# Patient Record
Sex: Male | Born: 1960
Health system: Southern US, Community
[De-identification: ages and names within clinical notes are randomized; demographics above are authoritative.]

## PROBLEM LIST (undated history)

## (undated) DIAGNOSIS — E7800 Pure hypercholesterolemia, unspecified: Secondary | ICD-10-CM

## (undated) DIAGNOSIS — E119 Type 2 diabetes mellitus without complications: Secondary | ICD-10-CM

## (undated) DIAGNOSIS — I252 Old myocardial infarction: Secondary | ICD-10-CM

## (undated) DIAGNOSIS — I2510 Atherosclerotic heart disease of native coronary artery without angina pectoris: Secondary | ICD-10-CM

## (undated) DIAGNOSIS — M722 Plantar fascial fibromatosis: Secondary | ICD-10-CM

## (undated) DIAGNOSIS — I10 Essential (primary) hypertension: Secondary | ICD-10-CM

## (undated) HISTORY — PX: CORONARY ANGIOPLASTY WITH STENT PLACEMENT: SHX49

## (undated) HISTORY — PX: CARDIAC SURGERY: SHX584

---

## 2006-08-26 ENCOUNTER — Emergency Department: Payer: Self-pay | Admitting: Emergency Medicine

## 2007-01-29 ENCOUNTER — Ambulatory Visit: Payer: Self-pay | Admitting: Gastroenterology

## 2008-12-19 ENCOUNTER — Ambulatory Visit: Payer: Self-pay | Admitting: Internal Medicine

## 2009-06-15 ENCOUNTER — Ambulatory Visit: Payer: Self-pay | Admitting: Cardiology

## 2010-03-12 ENCOUNTER — Ambulatory Visit: Payer: Self-pay | Admitting: Internal Medicine

## 2010-03-12 ENCOUNTER — Inpatient Hospital Stay: Payer: Self-pay | Admitting: Specialist

## 2010-06-27 ENCOUNTER — Ambulatory Visit: Payer: Self-pay | Admitting: Cardiology

## 2010-07-14 ENCOUNTER — Emergency Department: Payer: Self-pay | Admitting: Emergency Medicine

## 2013-05-07 ENCOUNTER — Ambulatory Visit: Payer: Self-pay | Admitting: Gastroenterology

## 2013-05-10 LAB — PATHOLOGY REPORT

## 2013-12-11 DIAGNOSIS — I10 Essential (primary) hypertension: Secondary | ICD-10-CM | POA: Insufficient documentation

## 2013-12-11 DIAGNOSIS — E785 Hyperlipidemia, unspecified: Secondary | ICD-10-CM | POA: Insufficient documentation

## 2013-12-11 DIAGNOSIS — Z720 Tobacco use: Secondary | ICD-10-CM | POA: Insufficient documentation

## 2013-12-11 DIAGNOSIS — K219 Gastro-esophageal reflux disease without esophagitis: Secondary | ICD-10-CM | POA: Insufficient documentation

## 2014-02-17 DIAGNOSIS — Z9889 Other specified postprocedural states: Secondary | ICD-10-CM | POA: Insufficient documentation

## 2015-02-20 DIAGNOSIS — I25119 Atherosclerotic heart disease of native coronary artery with unspecified angina pectoris: Secondary | ICD-10-CM | POA: Insufficient documentation

## 2015-08-19 ENCOUNTER — Ambulatory Visit (INDEPENDENT_AMBULATORY_CARE_PROVIDER_SITE_OTHER): Payer: Commercial Managed Care - PPO

## 2015-08-19 ENCOUNTER — Ambulatory Visit
Admission: EM | Admit: 2015-08-19 | Discharge: 2015-08-19 | Disposition: A | Discharge status: Home / Self Care | Payer: Commercial Managed Care - PPO | Attending: Internal Medicine | Admitting: Internal Medicine

## 2015-08-19 DIAGNOSIS — M10071 Idiopathic gout, right ankle and foot: Secondary | ICD-10-CM | POA: Diagnosis not present

## 2015-08-19 DIAGNOSIS — M79671 Pain in right foot: Secondary | ICD-10-CM | POA: Diagnosis not present

## 2015-08-19 HISTORY — DX: Pure hypercholesterolemia, unspecified: E78.00

## 2015-08-19 HISTORY — DX: Old myocardial infarction: I25.2

## 2015-08-19 HISTORY — DX: Atherosclerotic heart disease of native coronary artery without angina pectoris: I25.10

## 2015-08-19 HISTORY — DX: Plantar fascial fibromatosis: M72.2

## 2015-08-19 HISTORY — DX: Essential (primary) hypertension: I10

## 2015-08-19 MED ORDER — KETOROLAC TROMETHAMINE 60 MG/2ML IM SOLN
60.0000 mg | Freq: Once | INTRAMUSCULAR | Status: AC
  Administered 2015-08-19: 60 mg via INTRAMUSCULAR

## 2015-08-19 MED ORDER — COLCHICINE 0.6 MG PO TABS: 0.6000 mg | ORAL_TABLET | Freq: Every day | ORAL | Status: DC

## 2015-08-19 NOTE — Discharge Instructions (Signed)
Please increase water-- stop alcohol--  Decrease red meats/seafood Contact PCP for re-evaluation next week  Gout Gout is an inflammatory arthritis caused by a buildup of uric acid crystals in the joints. Uric acid is a chemical that is normally present in the blood. When the level of uric acid in the blood is too high it can form crystals that deposit in your joints and tissues. This causes joint redness, soreness, and swelling (inflammation). Repeat attacks are common. Over time, uric acid crystals can form into masses (tophi) near a joint, destroying bone and causing disfigurement. Gout is treatable and often preventable. CAUSES  The disease begins with elevated levels of uric acid in the blood. Uric acid is produced by your body when it breaks down a naturally found substance called purines. Certain foods you eat, such as meats and fish, contain high amounts of purines. Causes of an elevated uric acid level include:  Being passed down from parent to child (heredity).  Diseases that cause increased uric acid production (such as obesity, psoriasis, and certain cancers).  Excessive alcohol use.  Diet, especially diets rich in meat and seafood.  Medicines, including certain cancer-fighting medicines (chemotherapy), water pills (diuretics), and aspirin.  Chronic kidney disease. The kidneys are no longer able to remove uric acid well.  Problems with metabolism. Conditions strongly associated with gout include:  Obesity.  High blood pressure.  High cholesterol.  Diabetes. Not everyone with elevated uric acid levels gets gout. It is not understood why some people get gout and others do not. Surgery, joint injury, and eating too much of certain foods are some of the factors that can lead to gout attacks. SYMPTOMS   An attack of gout comes on quickly. It causes intense pain with redness, swelling, and warmth in a joint.  Fever can occur.  Often, only one joint is involved. Certain  joints are more commonly involved:  Base of the big toe.  Knee.  Ankle.  Wrist.  Finger. Without treatment, an attack usually goes away in a few days to weeks. Between attacks, you usually will not have symptoms, which is different from many other forms of arthritis. DIAGNOSIS  Your caregiver will suspect gout based on your symptoms and exam. In some cases, tests may be recommended. The tests may include:  Blood tests.  Urine tests.  X-rays.  Joint fluid exam. This exam requires a needle to remove fluid from the joint (arthrocentesis). Using a microscope, gout is confirmed when uric acid crystals are seen in the joint fluid. TREATMENT  There are two phases to gout treatment: treating the sudden onset (acute) attack and preventing attacks (prophylaxis).  Treatment of an Acute Attack.  Medicines are used. These include anti-inflammatory medicines or steroid medicines.  An injection of steroid medicine into the affected joint is sometimes necessary.  The painful joint is rested. Movement can worsen the arthritis.  You may use warm or cold treatments on painful joints, depending which works best for you.  Treatment to Prevent Attacks.  If you suffer from frequent gout attacks, your caregiver may advise preventive medicine. These medicines are started after the acute attack subsides. These medicines either help your kidneys eliminate uric acid from your body or decrease your uric acid production. You may need to stay on these medicines for a very long time.  The early phase of treatment with preventive medicine can be associated with an increase in acute gout attacks. For this reason, during the first few months of treatment, your caregiver  may also advise you to take medicines usually used for acute gout treatment. Be sure you understand your caregiver's directions. Your caregiver may make several adjustments to your medicine dose before these medicines are effective.  Discuss  dietary treatment with your caregiver or dietitian. Alcohol and drinks high in sugar and fructose and foods such as meat, poultry, and seafood can increase uric acid levels. Your caregiver or dietitian can advise you on drinks and foods that should be limited. HOME CARE INSTRUCTIONS   Do not take aspirin to relieve pain. This raises uric acid levels.  Only take over-the-counter or prescription medicines for pain, discomfort, or fever as directed by your caregiver.  Rest the joint as much as possible. When in bed, keep sheets and blankets off painful areas.  Keep the affected joint raised (elevated).  Apply warm or cold treatments to painful joints. Use of warm or cold treatments depends on which works best for you.  Use crutches if the painful joint is in your leg.  Drink enough fluids to keep your urine clear or pale yellow. This helps your body get rid of uric acid. Limit alcohol, sugary drinks, and fructose drinks.  Follow your dietary instructions. Pay careful attention to the amount of protein you eat. Your daily diet should emphasize fruits, vegetables, whole grains, and fat-free or low-fat milk products. Discuss the use of coffee, vitamin C, and cherries with your caregiver or dietitian. These may be helpful in lowering uric acid levels.  Maintain a healthy body weight. SEEK MEDICAL CARE IF:   You develop diarrhea, vomiting, or any side effects from medicines.  You do not feel better in 24 hours, or you are getting worse. SEEK IMMEDIATE MEDICAL CARE IF:   Your joint becomes suddenly more tender, and you have chills or a fever. MAKE SURE YOU:   Understand these instructions.  Will watch your condition.  Will get help right away if you are not doing well or get worse.   This information is not intended to replace advice given to you by your health care provider. Make sure you discuss any questions you have with your health care provider.   Document Released: 08/16/2000  Document Revised: 09/09/2014 Document Reviewed: 04/01/2012 Elsevier Interactive Patient Education 2016 [place].  Gout Gout is when your joints become red, sore, and swell (inflamed). This is caused by the buildup of uric acid crystals in the joints. Uric acid is a chemical that is normally in the blood. If the level of uric acid gets too high in the blood, these crystals form in your joints and tissues. Over time, these crystals can form into masses near the joints and tissues. These masses can destroy bone and cause the bone to look misshapen (deformed). HOME CARE   Do not take aspirin for pain.  Only take medicine as told by your doctor.  Rest the joint as much as you can. When in bed, keep sheets and blankets off painful areas.  Keep the sore joints raised (elevated).  Put warm or cold packs on painful joints. Use of warm or cold packs depends on which works best for you.  Use crutches if the painful joint is in your leg.  Drink enough fluids to keep your pee (urine) clear or pale yellow. Limit alcohol, sugary drinks, and drinks with fructose in them.  Follow your diet instructions. Pay careful attention to how much protein you eat. Include fruits, vegetables, whole grains, and fat-free or low-fat milk products in your daily  diet. Talk to your doctor or dietitian about the use of coffee, vitamin C, and cherries. These may help lower uric acid levels.  Keep a healthy body weight. GET HELP RIGHT AWAY IF:   You have watery poop (diarrhea), throw up (vomit), or have any side effects from medicines.  You do not feel better in 24 hours, or you are getting worse.  Your joint becomes suddenly more tender, and you have chills or a fever. MAKE SURE YOU:   Understand these instructions.  Will watch your condition.  Will get help right away if you are not doing well or get worse.   This information is not intended to replace advice given to you by your health care provider. Make  sure you discuss any questions you have with your health care provider.   Document Released: 05/28/2008 Document Revised: 09/09/2014 Document Reviewed: 04/01/2012 Elsevier Interactive Patient Education [employer].

## 2015-08-19 NOTE — ED Provider Notes (Signed)
CSN: [id number]     Arrival date & time 08/19/15  [id number] History   First MD Initiated Contact with Patient 08/19/15 [phone]     Chief Complaint  Patient presents with  . Foot Pain    Right foot pain x months off and one. Worse with walkking. Pain 7/10    (Consider location/radiation/quality/duration/timing/severity/associated sxs/prior Treatment) HPI  54 yo M  c/o right foot pain x about a week; hx of previous similar episodes Denies trauma but stands on cement floor without rubber mat for work all day Pain increased over last week. Some mid-foot warmth  Previously followed by Jefm Bryant Has had similar episode Has had cortisone injection in past  Drinks "probably one or two beers daily", more weekends Red meats, seafood diet  S/P  3 stents - former smoker- now E cigs Brother has had gout- similar diet  Past Medical History  Diagnosis Date  . Coronary artery disease   . Hypertension   . High cholesterol   . Myocardial infarct, old   . Plantar fasciitis    Past Surgical History  Procedure Laterality Date  . Coronary angioplasty with stent placement     Family History  Problem Relation Age of Onset  . Gout Brother    Social History  Substance Use Topics  . Smoking status: Former Research scientist (life sciences)  . Smokeless tobacco: None  . Alcohol Use: Yes     Comment: last use last night    Review of Systems Review of 10 systems negative for acute change except as referenced in HPI  Allergies  Review of patient's allergies indicates no known allergies.  Home Medications   Prior to Admission medications   Medication Sig Start Date End Date Taking? Authorizing Provider  amLODipine (NORVASC) 10 MG tablet Take 10 mg by mouth daily.   Yes Historical Provider, MD  aspirin 81 MG tablet Take 81 mg by mouth daily.   Yes Historical Provider, MD  hydrochlorothiazide (HYDRODIURIL) 25 MG tablet Take 25 mg by mouth daily.   Yes Historical Provider, MD  lisinopril (PRINIVIL,ZESTRIL) 20 MG tablet Take 20  mg by mouth daily.   Yes Historical Provider, MD  metoprolol succinate (TOPROL-XL) 25 MG 24 hr tablet Take 25 mg by mouth daily.   Yes Historical Provider, MD  omeprazole (PRILOSEC) 40 MG capsule Take 40 mg by mouth daily.   Yes Historical Provider, MD  prasugrel (EFFIENT) 10 MG TABS tablet Take 10 mg by mouth daily.   Yes Historical Provider, MD  simvastatin (ZOCOR) 40 MG tablet Take 40 mg by mouth daily.   Yes Historical Provider, MD  colchicine 0.6 MG tablet Take 1 tablet (0.6 mg total) by mouth daily. 2 pills immediately then one in 12 hours, repeat one daily for for 10 days 08/19/15   Jan Fireman, PA-C   Meds Ordered and Administered this Visit   Medications  ketorolac (TORADOL) injection 60 mg (60 mg Intramuscular Given 08/19/15 1029)  significant improvement in foot pain  BP 145/81 mmHg  Pulse 66  Temp(Src) 98.2 F (36.8 C) (Oral)  Resp 20  Ht 5' 6.5" (1.689 m)  Wt 198 lb (89.812 kg)  BMI 31.48 kg/m2  SpO2 97% No data found.   Physical Exam   General: NAD, does not appear toxic HEENT:normocephalic,atraumatic, mucous membranes moist,grossly normal hearing Eyes: EOMI, conjunctiva clear, conjugate gaze Neck: supple,no lymphadenopathy Resp : CT A, bilat; normal respiratory effort Card : RRR Abd:  Not distended Skin: no rash, skin intact, mild erythema dorsum right  foot MSK: no deformities, ambulatory without assistance- complains of mid right foot pain with palpation; mid foot mildly warm, toes FROM,  good cap fill. Mildly swollen dorsum mid-foot. Moderate pes planus bilaterally Neuro : good attention,recall-good memory, no focal neuro deficits Psych: speech and behavior appropriate   ED Course  Procedures (including critical care time)  Labs Review Labs Reviewed - No data to display  Imaging Review No results found. Midfoot degenerative changes noted  Pertinent  imaging results that were available during my care of the patient were reviewed by me and considered  in medical decision making.   MDM   1. Right foot pain   2. Acute idiopathic gout of right foot    Plan: Test/x-ray results and diagnosis reviewed with patient Rx as per orders;  benefits, risks, potential side effects reviewed   Recommend supportive treatment with dietary changes, limited alcohol, increased water hydration Informational handouts given Diagnosis and treatment discussed.  Questions fielded, expectations and recommendations reviewed. Discussed follow up and return parameters including no resolution or any worsening condition..  Patient expresses understanding  and agrees to plan. [facility] return to [facility] with questions, concerns or exacerbation.  Needs routine management with PCP- Podiatry consult possibly helpful-unclear whether he has shoe inserts that are actually worn Encouraged him to return to PCP care for decision on anti-inflammatory managment  Discharge Medication List as of 08/19/2015 11:23 AM    START taking these medications   Details  colchicine 0.6 MG tablet Take 1 tablet (0.6 mg total) by mouth daily. 2 pills immediately then one in 12 hours, repeat one daily for for 10 days, Starting 08/19/2015, Until Discontinued, Print        Jan Fireman, PA-C 08/23/15 1334

## 2015-08-23 ENCOUNTER — Encounter: Payer: Self-pay | Admitting: Physician Assistant

## 2016-04-04 DIAGNOSIS — M1711 Unilateral primary osteoarthritis, right knee: Secondary | ICD-10-CM | POA: Insufficient documentation

## 2017-02-27 DIAGNOSIS — R079 Chest pain, unspecified: Secondary | ICD-10-CM | POA: Insufficient documentation

## 2017-03-21 ENCOUNTER — Other Ambulatory Visit: Payer: Self-pay | Admitting: Internal Medicine

## 2017-03-21 DIAGNOSIS — M545 Low back pain, unspecified: Secondary | ICD-10-CM

## 2017-03-27 ENCOUNTER — Ambulatory Visit: Payer: Commercial Managed Care - PPO

## 2017-03-28 ENCOUNTER — Ambulatory Visit
Admission: RE | Admit: 2017-03-28 | Discharge: 2017-03-28 | Disposition: A | Discharge status: Home / Self Care | Payer: Commercial Managed Care - PPO | Source: Ambulatory Visit | Attending: Internal Medicine | Admitting: Internal Medicine

## 2017-03-28 DIAGNOSIS — M8938 Hypertrophy of bone, other site: Secondary | ICD-10-CM | POA: Insufficient documentation

## 2017-03-28 DIAGNOSIS — M5126 Other intervertebral disc displacement, lumbar region: Secondary | ICD-10-CM | POA: Diagnosis not present

## 2017-03-28 DIAGNOSIS — M545 Low back pain, unspecified: Secondary | ICD-10-CM

## 2017-03-28 DIAGNOSIS — M4807 Spinal stenosis, lumbosacral region: Secondary | ICD-10-CM | POA: Insufficient documentation

## 2017-03-28 DIAGNOSIS — M48061 Spinal stenosis, lumbar region without neurogenic claudication: Secondary | ICD-10-CM | POA: Insufficient documentation

## 2017-12-10 DIAGNOSIS — R011 Cardiac murmur, unspecified: Secondary | ICD-10-CM | POA: Insufficient documentation

## 2017-12-31 ENCOUNTER — Ambulatory Visit
Admission: RE | Admit: 2017-12-31 | Discharge: 2017-12-31 | Disposition: A | Discharge status: Home / Self Care | Payer: 59 | Source: Ambulatory Visit | Attending: Cardiology | Admitting: Cardiology

## 2017-12-31 ENCOUNTER — Encounter
Admission: RE | Disposition: A | Discharge status: Home / Self Care | Payer: Self-pay | Source: Ambulatory Visit | Attending: Cardiology

## 2017-12-31 DIAGNOSIS — I350 Nonrheumatic aortic (valve) stenosis: Secondary | ICD-10-CM | POA: Diagnosis present

## 2017-12-31 DIAGNOSIS — T82855A Stenosis of coronary artery stent, initial encounter: Secondary | ICD-10-CM | POA: Diagnosis not present

## 2017-12-31 DIAGNOSIS — Z79899 Other long term (current) drug therapy: Secondary | ICD-10-CM | POA: Diagnosis not present

## 2017-12-31 DIAGNOSIS — F17200 Nicotine dependence, unspecified, uncomplicated: Secondary | ICD-10-CM | POA: Diagnosis not present

## 2017-12-31 DIAGNOSIS — Z955 Presence of coronary angioplasty implant and graft: Secondary | ICD-10-CM | POA: Insufficient documentation

## 2017-12-31 DIAGNOSIS — K219 Gastro-esophageal reflux disease without esophagitis: Secondary | ICD-10-CM | POA: Diagnosis not present

## 2017-12-31 DIAGNOSIS — I10 Essential (primary) hypertension: Secondary | ICD-10-CM | POA: Diagnosis not present

## 2017-12-31 DIAGNOSIS — I25119 Atherosclerotic heart disease of native coronary artery with unspecified angina pectoris: Secondary | ICD-10-CM | POA: Insufficient documentation

## 2017-12-31 DIAGNOSIS — Y831 Surgical operation with implant of artificial internal device as the cause of abnormal reaction of the patient, or of later complication, without mention of misadventure at the time of the procedure: Secondary | ICD-10-CM | POA: Diagnosis not present

## 2017-12-31 DIAGNOSIS — E7800 Pure hypercholesterolemia, unspecified: Secondary | ICD-10-CM | POA: Insufficient documentation

## 2017-12-31 HISTORY — PX: RIGHT/LEFT HEART CATH AND CORONARY ANGIOGRAPHY: CATH118266

## 2017-12-31 SURGERY — RIGHT/LEFT HEART CATH AND CORONARY ANGIOGRAPHY
Anesthesia: Moderate Sedation

## 2017-12-31 MED ORDER — MIDAZOLAM HCL 2 MG/2ML IJ SOLN
INTRAMUSCULAR | Status: AC
  Filled 2017-12-31: qty 2

## 2017-12-31 MED ORDER — SODIUM CHLORIDE 0.9% FLUSH: 3.0000 mL | Freq: Two times a day (BID) | INTRAVENOUS | Status: DC

## 2017-12-31 MED ORDER — SODIUM CHLORIDE 0.9% FLUSH: 3.0000 mL | INTRAVENOUS | Status: DC | PRN

## 2017-12-31 MED ORDER — LIDOCAINE HCL (PF) 1 % IJ SOLN
INTRAMUSCULAR | Status: AC
  Filled 2017-12-31: qty 30

## 2017-12-31 MED ORDER — HEPARIN (PORCINE) IN NACL 1000-0.9 UT/500ML-% IV SOLN
INTRAVENOUS | Status: AC
  Filled 2017-12-31: qty 1000

## 2017-12-31 MED ORDER — FENTANYL CITRATE (PF) 100 MCG/2ML IJ SOLN
INTRAMUSCULAR | Status: DC | PRN
  Administered 2017-12-31 (×2): 25 ug via INTRAVENOUS

## 2017-12-31 MED ORDER — FENTANYL CITRATE (PF) 100 MCG/2ML IJ SOLN
INTRAMUSCULAR | Status: AC
  Filled 2017-12-31: qty 2

## 2017-12-31 MED ORDER — SODIUM CHLORIDE 0.9 % WEIGHT BASED INFUSION: 1.0000 mL/kg/h | INTRAVENOUS | Status: DC

## 2017-12-31 MED ORDER — IOPAMIDOL (ISOVUE-300) INJECTION 61%
INTRAVENOUS | Status: DC | PRN
  Administered 2017-12-31: 100 mL via INTRA_ARTERIAL

## 2017-12-31 MED ORDER — ASPIRIN 81 MG PO CHEW: 81.0000 mg | CHEWABLE_TABLET | ORAL | Status: DC

## 2017-12-31 MED ORDER — SODIUM CHLORIDE 0.9 % WEIGHT BASED INFUSION
3.0000 mL/kg/h | INTRAVENOUS | Status: AC
  Administered 2017-12-31: 3 mL/kg/h via INTRAVENOUS

## 2017-12-31 MED ORDER — SODIUM CHLORIDE 0.9 % IV SOLN: 250.0000 mL | INTRAVENOUS | Status: DC | PRN

## 2017-12-31 MED ORDER — MIDAZOLAM HCL 2 MG/2ML IJ SOLN
INTRAMUSCULAR | Status: DC | PRN
  Administered 2017-12-31: 0.5 mg via INTRAVENOUS
  Administered 2017-12-31: 1 mg via INTRAVENOUS

## 2017-12-31 SURGICAL SUPPLY — 13 items
CATH INFINITI 5FR JL4 (CATHETERS) ×3 IMPLANT
CATH INFINITI JR4 5F (CATHETERS) ×3 IMPLANT
CATH LANGSTON DUAL LUM PIG 6FR (CATHETERS) ×3 IMPLANT
CATH SWANZ 7F THERMO (CATHETERS) ×3 IMPLANT
DEVICE CLOSURE MYNXGRIP 6/7F (Vascular Products) ×3 IMPLANT
KIT MANI 3VAL PERCEP (MISCELLANEOUS) ×3 IMPLANT
KIT RIGHT HEART (MISCELLANEOUS) ×3 IMPLANT
NEEDLE PERC 18GX7CM (NEEDLE) ×3 IMPLANT
PACK CARDIAC CATH (CUSTOM PROCEDURE TRAY) ×3 IMPLANT
SHEATH AVANTI 6FR X 11CM (SHEATH) ×3 IMPLANT
SHEATH PINNACLE 7F 10CM (SHEATH) ×3 IMPLANT
WIRE EMERALD ST .035X150CM (WIRE) ×3 IMPLANT
WIRE GUIDERIGHT .035X150 (WIRE) ×3 IMPLANT

## 2019-04-08 ENCOUNTER — Other Ambulatory Visit: Payer: Self-pay | Admitting: Gerontology

## 2019-04-08 DIAGNOSIS — Z136 Encounter for screening for cardiovascular disorders: Secondary | ICD-10-CM

## 2019-04-08 DIAGNOSIS — Z7189 Other specified counseling: Secondary | ICD-10-CM

## 2019-04-08 DIAGNOSIS — Z0000 Encounter for general adult medical examination without abnormal findings: Secondary | ICD-10-CM

## 2019-04-08 DIAGNOSIS — K219 Gastro-esophageal reflux disease without esophagitis: Secondary | ICD-10-CM

## 2019-04-08 DIAGNOSIS — R079 Chest pain, unspecified: Secondary | ICD-10-CM

## 2019-04-08 DIAGNOSIS — R238 Other skin changes: Secondary | ICD-10-CM | POA: Insufficient documentation

## 2019-04-09 DIAGNOSIS — E11649 Type 2 diabetes mellitus with hypoglycemia without coma: Secondary | ICD-10-CM | POA: Insufficient documentation

## 2019-04-09 DIAGNOSIS — E119 Type 2 diabetes mellitus without complications: Secondary | ICD-10-CM | POA: Insufficient documentation

## 2019-04-09 DIAGNOSIS — E538 Deficiency of other specified B group vitamins: Secondary | ICD-10-CM | POA: Insufficient documentation

## 2019-04-09 DIAGNOSIS — E559 Vitamin D deficiency, unspecified: Secondary | ICD-10-CM | POA: Insufficient documentation

## 2019-10-06 DIAGNOSIS — N529 Male erectile dysfunction, unspecified: Secondary | ICD-10-CM | POA: Insufficient documentation

## 2020-01-05 ENCOUNTER — Encounter: Payer: Self-pay | Admitting: Family Medicine

## 2020-01-05 ENCOUNTER — Ambulatory Visit: Payer: Self-pay | Admitting: Family Medicine

## 2020-01-05 ENCOUNTER — Other Ambulatory Visit: Payer: Self-pay

## 2020-01-05 DIAGNOSIS — Z113 Encounter for screening for infections with a predominantly sexual mode of transmission: Secondary | ICD-10-CM

## 2020-01-05 DIAGNOSIS — N4889 Other specified disorders of penis: Secondary | ICD-10-CM

## 2020-01-05 MED ORDER — CLOTRIMAZOLE 1 % EX OINT: TOPICAL_OINTMENT | CUTANEOUS | 0 refills | Status: DC

## 2020-01-05 NOTE — Progress Notes (Signed)
Gram stain reviewed and is negative today, so no treatment needed for gram stain per standing order. Pt received Clotrimazole per Charlotte Sanes, PA-C order. Counseled pt per provider orders and pt states understanding. Provider orders completed.Ronny Bacon, RN

## 2020-01-05 NOTE — Progress Notes (Signed)
Pt here for STD screening.Ronny Bacon, RN

## 2020-01-05 NOTE — Progress Notes (Signed)
[facility] Department STI clinic/screening visit  Subjective:  Curtis Maxwell is a 59 y.o. male being seen today for  Chief Complaint  Patient presents with  . SEXUALLY TRANSMITTED DISEASE    STD screening     The patient reports they do have symptoms.   Patient has the following medical conditions:   Patient Active Problem List   Diagnosis Date Noted  . Erectile dysfunction 10/06/2019  . Low vitamin B12 level 04/09/2019  . Type 2 diabetes mellitus without complication, without long-term current use of insulin ([place]) 04/09/2019  . Vitamin D deficiency 04/09/2019  . Abnormal skin color 04/08/2019  . Systolic murmur [id number]  . Chest pain with high risk for cardiac etiology 02/27/2017  . Primary osteoarthritis of right knee 04/04/2016  . Coronary artery disease involving native coronary artery of native heart with angina pectoris ([place]) 02/20/2015  . H/O cardiac catheterization 02/17/2014  . GERD (gastroesophageal reflux disease) 12/11/2013  . HTN (hypertension) 12/11/2013  . Hyperlipidemia 12/11/2013  . Tobacco abuse 12/11/2013    HPI  Pt reports both he and partner have felt "irritation" in genitals x10 days. Partner got tested, didn't result anything. He reports underside of penis feels "irritated" and very tender to touch. Sex has been painful. He denies new lubricants, soaps or lotions.  See flowsheet for further details and programmatic requirements.    No components found for: HCV  The following portions of the patient's history were reviewed and updated as appropriate: allergies, current medications, past medical history, past social history, past surgical history and problem list.  Objective:  There were no vitals filed for this visit.   Physical Exam Constitutional:      Appearance: Normal appearance.  HENT:     Head: Normocephalic and atraumatic.     Comments: No nits or hair loss    Mouth/Throat:     Mouth: Mucous membranes are moist.   Pharynx: Oropharynx is clear. No oropharyngeal exudate or posterior oropharyngeal erythema.  Pulmonary:     Effort: Pulmonary effort is normal.  Abdominal:     General: Abdomen is flat.     Palpations: Abdomen is soft. There is no hepatomegaly or mass.     Tenderness: There is no abdominal tenderness.  Genitourinary:    Pubic Area: No rash or pubic lice.      Penis: Circumcised. Erythema (posterior glans/distal shaft) and tenderness (posterior glans/distal shaft) present.      Testes: Normal.     Epididymis:     Right: Normal.     Left: Normal.     Rectum: Normal.  Lymphadenopathy:     Head:     Right side of head: No preauricular or posterior auricular adenopathy.     Left side of head: No preauricular or posterior auricular adenopathy.     Cervical: No cervical adenopathy.     Upper Body:     Right upper body: No supraclavicular or axillary adenopathy.     Left upper body: No supraclavicular or axillary adenopathy.     Lower Body: No right inguinal adenopathy. No left inguinal adenopathy.  Skin:    General: Skin is warm and dry.     Findings: No rash.  Neurological:     Mental Status: He is alert and oriented to person, place, and time.       Assessment and Plan:  Curtis Maxwell is a 59 y.o. male presenting to the [facility] for STI screening    1. Screening  examination for venereal disease -Pt with symptoms. Screenings today as below. Treat gram stain per standing order. -Patient does meet criteria for HepC Screening. Accepts this screening. Declines HIV screening.  -Counseled on warning s/sx and when to seek care. Recommended condom use with all sex and discussed importance of condom use for STI prevention. - Gram stain - Syphilis Serology, [facility] - HCV [place] [facility] - Gonococcus culture  2. Irritation of penis -Onset of areas of genital irritation/pain in pt and partner is suspicious for HSV, though no report or evidence of  vesicles/lesions. We discussed possibility of resolving HSV vs other skin infection/contact irritation. Pt requests some form of treatment today - clotrimazole AF given. Advised to RTC if symptoms worsen, fail to improve or if any lesions develop in the future.  - Clotrimazole 1 % OINT; Apply to area twice daily for 10-14 days  Dispense: 28.35 g; Refill: 0   Return if symptoms worsen or fail to improve.  No future appointments.  Kandee Keen, PA-C

## 2020-01-06 LAB — GRAM STAIN

## 2020-01-09 LAB — GONOCOCCUS CULTURE

## 2020-01-11 LAB — HM HEPATITIS C SCREENING LAB: HM Hepatitis Screen: NEGATIVE

## 2020-01-17 ENCOUNTER — Other Ambulatory Visit (HOSPITAL_COMMUNITY): Payer: Self-pay | Admitting: Cardiology

## 2020-02-03 ENCOUNTER — Other Ambulatory Visit: Payer: Self-pay

## 2020-02-03 ENCOUNTER — Emergency Department: Payer: Commercial Managed Care - PPO

## 2020-02-03 ENCOUNTER — Observation Stay
Admission: EM | Admit: 2020-02-03 | Discharge: 2020-02-04 | Disposition: A | Discharge status: Home / Self Care | Payer: Commercial Managed Care - PPO | Attending: Internal Medicine | Admitting: Internal Medicine

## 2020-02-03 DIAGNOSIS — I252 Old myocardial infarction: Secondary | ICD-10-CM | POA: Insufficient documentation

## 2020-02-03 DIAGNOSIS — Z20822 Contact with and (suspected) exposure to covid-19: Secondary | ICD-10-CM | POA: Insufficient documentation

## 2020-02-03 DIAGNOSIS — Z7984 Long term (current) use of oral hypoglycemic drugs: Secondary | ICD-10-CM | POA: Insufficient documentation

## 2020-02-03 DIAGNOSIS — I200 Unstable angina: Secondary | ICD-10-CM | POA: Diagnosis present

## 2020-02-03 DIAGNOSIS — Z79899 Other long term (current) drug therapy: Secondary | ICD-10-CM | POA: Diagnosis not present

## 2020-02-03 DIAGNOSIS — K219 Gastro-esophageal reflux disease without esophagitis: Secondary | ICD-10-CM | POA: Insufficient documentation

## 2020-02-03 DIAGNOSIS — I25110 Atherosclerotic heart disease of native coronary artery with unstable angina pectoris: Principal | ICD-10-CM | POA: Insufficient documentation

## 2020-02-03 DIAGNOSIS — I352 Nonrheumatic aortic (valve) stenosis with insufficiency: Secondary | ICD-10-CM | POA: Diagnosis not present

## 2020-02-03 DIAGNOSIS — Z7982 Long term (current) use of aspirin: Secondary | ICD-10-CM | POA: Insufficient documentation

## 2020-02-03 DIAGNOSIS — E7800 Pure hypercholesterolemia, unspecified: Secondary | ICD-10-CM | POA: Diagnosis not present

## 2020-02-03 DIAGNOSIS — I10 Essential (primary) hypertension: Secondary | ICD-10-CM | POA: Insufficient documentation

## 2020-02-03 DIAGNOSIS — R079 Chest pain, unspecified: Secondary | ICD-10-CM

## 2020-02-03 DIAGNOSIS — Z87891 Personal history of nicotine dependence: Secondary | ICD-10-CM | POA: Insufficient documentation

## 2020-02-03 DIAGNOSIS — I350 Nonrheumatic aortic (valve) stenosis: Secondary | ICD-10-CM

## 2020-02-03 DIAGNOSIS — Z955 Presence of coronary angioplasty implant and graft: Secondary | ICD-10-CM | POA: Diagnosis not present

## 2020-02-03 DIAGNOSIS — E785 Hyperlipidemia, unspecified: Secondary | ICD-10-CM | POA: Insufficient documentation

## 2020-02-03 DIAGNOSIS — I491 Atrial premature depolarization: Secondary | ICD-10-CM | POA: Insufficient documentation

## 2020-02-03 DIAGNOSIS — E119 Type 2 diabetes mellitus without complications: Secondary | ICD-10-CM | POA: Insufficient documentation

## 2020-02-03 LAB — BASIC METABOLIC PANEL
Anion gap: 10 (ref 5–15)
BUN: 28 mg/dL — ABNORMAL HIGH (ref 6–20)
CO2: 23 mmol/L (ref 22–32)
Calcium: 9 mg/dL (ref 8.9–10.3)
Chloride: 108 mmol/L (ref 98–111)
Creatinine, Ser: 1.42 mg/dL — ABNORMAL HIGH (ref 0.61–1.24)
GFR calc Af Amer: >60 mL/min (ref >60)
GFR calc non Af Amer: 54 mL/min — ABNORMAL LOW (ref >60)
Glucose, Bld: 176 mg/dL — ABNORMAL HIGH (ref 70–99)
Potassium: 4.8 mmol/L (ref 3.5–5.1)
Sodium: 141 mmol/L (ref 135–145)

## 2020-02-03 LAB — HEMOGLOBIN A1C
Hgb A1c MFr Bld: 6.3 % — ABNORMAL HIGH (ref 4.8–5.6)
Mean Plasma Glucose: 134.11 mg/dL

## 2020-02-03 LAB — CBC
HCT: 36.4 % — ABNORMAL LOW (ref 39.0–52.0)
Hemoglobin: 11.3 g/dL — ABNORMAL LOW (ref 13.0–17.0)
MCH: 28.3 pg (ref 26.0–34.0)
MCHC: 31 g/dL (ref 30.0–36.0)
MCV: 91.2 fL (ref 80.0–100.0)
Platelets: 216 10*3/uL (ref 150–400)
RBC: 3.99 MIL/uL — ABNORMAL LOW (ref 4.22–5.81)
RDW: 13.7 % (ref 11.5–15.5)
WBC: 9.3 10*3/uL (ref 4.0–10.5)
nRBC: 0 % (ref 0.0–0.2)

## 2020-02-03 LAB — PROTIME-INR
INR: 1.2 (ref 0.8–1.2)
Prothrombin Time: 14.6 seconds (ref 11.4–15.2)

## 2020-02-03 LAB — TROPONIN I (HIGH SENSITIVITY)
Troponin I (High Sensitivity): 15 ng/L (ref <18)
Troponin I (High Sensitivity): 51 ng/L — ABNORMAL HIGH (ref <18)
Troponin I (High Sensitivity): 57 ng/L — ABNORMAL HIGH (ref <18)
Troponin I (High Sensitivity): 47 ng/L — ABNORMAL HIGH (ref <18)

## 2020-02-03 LAB — APTT: aPTT: 30 seconds (ref 24–36)

## 2020-02-03 LAB — SARS CORONAVIRUS 2 BY RT PCR (HOSPITAL ORDER, PERFORMED IN ~~LOC~~ HOSPITAL LAB): SARS Coronavirus 2: NEGATIVE

## 2020-02-03 LAB — GLUCOSE, CAPILLARY: Glucose-Capillary: 130 mg/dL — ABNORMAL HIGH (ref 70–99)

## 2020-02-03 MED ORDER — INSULIN ASPART 100 UNIT/ML ~~LOC~~ SOLN: 0.0000 [IU] | Freq: Every day | SUBCUTANEOUS | Status: DC

## 2020-02-03 MED ORDER — ASPIRIN 81 MG PO CHEW
324.0000 mg | CHEWABLE_TABLET | Freq: Once | ORAL | Status: DC
  Filled 2020-02-03: qty 4

## 2020-02-03 MED ORDER — SODIUM CHLORIDE 0.9% FLUSH: 3.0000 mL | Freq: Once | INTRAVENOUS | Status: DC

## 2020-02-03 MED ORDER — HEPARIN (PORCINE) 25000 UT/250ML-% IV SOLN
1000.0000 [IU]/h | INTRAVENOUS | Status: DC
  Administered 2020-02-03: 1150 [IU]/h via INTRAVENOUS
  Filled 2020-02-03: qty 250

## 2020-02-03 MED ORDER — INSULIN ASPART 100 UNIT/ML ~~LOC~~ SOLN: 0.0000 [IU] | Freq: Three times a day (TID) | SUBCUTANEOUS | Status: DC

## 2020-02-03 MED ORDER — SODIUM CHLORIDE 0.9 % IV SOLN
1000.0000 mL | Freq: Once | INTRAVENOUS | Status: AC
  Administered 2020-02-03: 1000 mL via INTRAVENOUS

## 2020-02-03 NOTE — ED Notes (Signed)
Pt requesting information about pursuing disability claim. Requesting our ED physician to contact his PCP. EDP aware.

## 2020-02-03 NOTE — ED Notes (Signed)
Fluids complete. Pt updated on plan of care. Pt states that he passed out at work and that he almost died. States that he stopped breathing and his coworker started chest compressions. Advised pt that chest compressions would explain an elevated troponin. Pt states that he will be unable to go back to work. Advised pt we will write him a note until his follow up appt with cardio.

## 2020-02-03 NOTE — ED Notes (Signed)
Pt given sandwich tray 

## 2020-02-03 NOTE — H&P (Signed)
[facility] NAME: Curtis Maxwell    MR#:  [id number]  DATE OF BIRTH:  05/09/1961  DATE OF ADMISSION:  02/03/2020  PRIMARY CARE PHYSICIAN: [place]   REQUESTING/REFERRING PHYSICIAN: Dr. Archie Balboa  Patient coming from : from work  Wife in the ER CHIEF COMPLAINT:  chest pain and shortness of breath. Patient states he passed out at work  HISTORY OF PRESENT ILLNESS:  Curtis Maxwell  is a 59 y.o. male with a known history of coronary artery disease status post stent in the past, hypertension, hyperlipidemia,gerd comes to the emergency room after he reports passing out at work. He has been having chest pain mid substernal and epigastric area on and off for last several days. Patient says his coworkers did chest compression.  He was seen by Dr Josefa Half about a month ago. Patient has had cardiac cath in the past 2019revealed occluded proximal RCA with collaterals, diffuse 60% in-stent restenosis small caliber OM1, and 6% stenosis D1, with moderate aortic stenosis, aortic valve area 1.33 cm   ED course: in the ER patient is hemodynamically stable. EKG shows sinus rhythm with old Q waves and possible lateral wall ischemia. His first troponin was 15-- repeat was 51 patient received for baby aspirin's in route via EMS.  He is being admitted for further evaluation management of unstable angina/ACS PAST MEDICAL HISTORY:   Past Medical History:  Diagnosis Date  . Coronary artery disease   . High cholesterol   . Hypertension   . Myocardial infarct, old   . Plantar fasciitis     PAST SURGICAL HISTOIRY:   Past Surgical History:  Procedure Laterality Date  . CARDIAC SURGERY    . CORONARY ANGIOPLASTY WITH STENT PLACEMENT    . RIGHT/LEFT HEART CATH AND CORONARY ANGIOGRAPHY N/A 12/31/2017   Procedure: RIGHT/LEFT HEART CATH AND CORONARY ANGIOGRAPHY;  Surgeon: Isaias Cowman, MD;  Location: [facility];  Service:  Cardiovascular;  Laterality: N/A;    SOCIAL HISTORY:   Social History   Tobacco Use  . Smoking status: Former Smoker    Types: E-cigarettes  . Smokeless tobacco: Never Used  Substance Use Topics  . Alcohol use: Yes    Alcohol/week: 10.0 standard drinks    Types: 10 Cans of beer per week    FAMILY HISTORY:   Family History  Problem Relation Age of Onset  . Gout Brother     DRUG ALLERGIES:  No Known Allergies  REVIEW OF SYSTEMS:  Review of Systems  Constitutional: Negative for chills, fever and weight loss.  HENT: Negative for ear discharge, ear pain and nosebleeds.   Eyes: Negative for blurred vision, pain and discharge.  Respiratory: Negative for sputum production, shortness of breath, wheezing and stridor.   Cardiovascular: Positive for chest pain. Negative for palpitations, orthopnea and PND.  Gastrointestinal: Negative for abdominal pain, diarrhea, nausea and vomiting.  Genitourinary: Negative for frequency and urgency.  Musculoskeletal: Negative for back pain and joint pain.  Neurological: Negative for sensory change, speech change, focal weakness and weakness.  Psychiatric/Behavioral: Negative for depression and hallucinations. The patient is not nervous/anxious.      MEDICATIONS AT HOME:   Prior to Admission medications   Medication Sig Start Date End Date Taking? Authorizing Provider  amLODipine (NORVASC) 10 MG tablet Take 10 mg by mouth daily.   Yes [provider]  aspirin 81 MG tablet Take 81 mg by mouth daily.   Yes [provider]  Cholecalciferol 50 MCG (2000 UT) CAPS Take by mouth daily.  04/09/19  Yes [provider]  cyanocobalamin 1000 MCG tablet Take 1,000 mcg by mouth daily.  04/09/19  Yes [provider]  hydrochlorothiazide (HYDRODIURIL) 25 MG tablet Take 25 mg by mouth daily.   Yes [provider]  lisinopril (PRINIVIL,ZESTRIL) 40 MG tablet Take 40 mg by mouth daily.    Yes [provider]   metFORMIN (GLUCOPHAGE) 500 MG tablet Take by mouth. 04/09/19 04/08/20 Yes [provider]  metoprolol succinate (TOPROL-XL) 25 MG 24 hr tablet Take 25 mg by mouth daily.   Yes [provider]  Omega-3 Fatty Acids (FISH OIL) 1200 MG CAPS Take by mouth.   Yes [provider]  omeprazole (PRILOSEC) 20 MG capsule Take 20-40 mg by mouth daily as needed.    Yes [provider]  prasugrel (EFFIENT) 10 MG TABS tablet Take 10 mg by mouth daily.   Yes [provider]  sildenafil (REVATIO) 20 MG tablet SMARTSIG:3 Tablet(s) By Mouth PRN 08/20/19  Yes [provider]  simvastatin (ZOCOR) 20 MG tablet Take 20 mg by mouth daily.    Yes [provider]      VITAL SIGNS:  Blood pressure 126/68, pulse 63, temperature 98.5 F (36.9 C), resp. rate 14, height 5\' 7"  (1.702 m), weight 84.8 kg, SpO2 98 %.  PHYSICAL EXAMINATION:  GENERAL:  60 y.o.-year-old patient lying in the bed with no acute distress.  EYES: Pupils equal, round, reactive to light and accommodation. No scleral icterus.  HEENT: Head atraumatic, normocephalic. Oropharynx and nasopharynx clear.  NECK:  Supple, no jugular venous distention. No thyroid enlargement, no tenderness.  LUNGS: Normal breath sounds bilaterally, no wheezing, rales,rhonchi or crepitation. No use of accessory muscles of respiration.  CARDIOVASCULAR: S1, S2 normal. No murmurs, rubs, or gallops.  ABDOMEN: Soft, nontender, nondistended. Bowel sounds present. No organomegaly or mass.  EXTREMITIES: No pedal edema, cyanosis, or clubbing.  NEUROLOGIC: Cranial nerves II through XII are intact. Muscle strength 5/5 in all extremities. Sensation intact. Gait not checked.  PSYCHIATRIC: The patient is alert and oriented x 3. anxious SKIN: No obvious rash, lesion, or ulcer.   LABORATORY PANEL:   CBC Recent Labs  Lab 02/03/20 1244  WBC 9.3  HGB 11.3*  HCT 36.4*  PLT 216    ------------------------------------------------------------------------------------------------------------------  Chemistries  Recent Labs  Lab 02/03/20 1244  NA 141  K 4.8  CL 108  CO2 23  GLUCOSE 176*  BUN 28*  CREATININE 1.42*  CALCIUM 9.0   ------------------------------------------------------------------------------------------------------------------  Cardiac Enzymes No results for input(s): TROPONINI in the last 168 hours. ------------------------------------------------------------------------------------------------------------------  RADIOLOGY:  DG Chest 2 View  Result Date: 02/03/2020 CLINICAL DATA:  Chest pain. EXAM: CHEST - 2 VIEW COMPARISON:  Chest x-ray 03/12/2010. FINDINGS: Mediastinum and hilar structures normal. Heart size normal. Mild bibasilar subsegmental atelectasis. Mild infiltrates cannot be excluded. No pleural effusion or pneumothorax. IMPRESSION: Mild bibasilar subsegmental atelectasis. Mild infiltrates cannot be excluded. Electronically Signed   By: Marcello Moores  Register   On: 02/03/2020 13:31    EKG:    IMPRESSION AND PLAN:  Kilik Burgin  is a 59 y.o. male with a known history of coronary artery disease status post stent in the past, hypertension, hyperlipidemia,gerd comes to the emergency room after he reports passing out at work. He has been having chest pain mid substernal and epigastric area on and off for last several days. Patient says his coworkers did chest compression.  1. Unstable angina/possible  NSTEMI -admit to telemetry/progressive cardiac unit -start IV heparin-- discussed with Dr. Ubaldo Glassing will see patient in consultation -cycle troponin -continue aspirin,prasugrel, statins, beta-blockers -PRN nitroglycerin  2. Type II diabetes -hold metformin -sliding scale insulin  3. Hyperlipidemia -continue statin  4. Hypertension -continue hydrochlorothiazide, beta-blockers,lisinopril  5. DVT prophylaxis on heparin drip     Family  Communication :wife in ER Consults :cardiology Code Status :full DVT prophylaxis :heparin  Status is: Inpatient  Remains inpatient appropriate because:IV treatments appropriate due to intensity of illness or inability to take PO   Dispo: The patient is from: Home              Anticipated d/c is to: Home              Anticipated d/c date is: 2 days              Patient currently is not medically stable to d/c.       TOTAL TIME TAKING CARE OF THIS PATIENT: *55* minutes.    Fritzi Mandes M.D  [facility]     CC: Primary care physician; [place]

## 2020-02-03 NOTE — ED Provider Notes (Signed)
[facility] Emergency Department Provider Note   ____________________________________________    I have reviewed the triage vital signs and the nursing notes.   HISTORY  Chief Complaint Chest Pain and Loss of Consciousness     HPI Curtis Maxwell is a 59 y.o. male with a history of coronary artery disease, diabetes, hypertension presents with complaints of chest pain. Patient presents today from work with reports of chest pain with dizziness and a syncopal episode that lasted several minutes. He reports that he has been having daily chest pain and does have an appoint with Dr. Saralyn Pilar on the 17th of June, denies fevers or chills. No shortness of breath. Feeling improved at this time. Has not taken anything besides aspirin and nitro per EMS   Past Medical History:  Diagnosis Date  . Coronary artery disease   . High cholesterol   . Hypertension   . Myocardial infarct, old   . Plantar fasciitis     Patient Active Problem List   Diagnosis Date Noted  . Erectile dysfunction 10/06/2019  . Low vitamin B12 level 04/09/2019  . Type 2 diabetes mellitus without complication, without long-term current use of insulin ([place]) 04/09/2019  . Vitamin D deficiency 04/09/2019  . Abnormal skin color 04/08/2019  . Systolic murmur [id number]  . Chest pain with high risk for cardiac etiology 02/27/2017  . Primary osteoarthritis of right knee 04/04/2016  . Coronary artery disease involving native coronary artery of native heart with angina pectoris ([place]) 02/20/2015  . H/O cardiac catheterization 02/17/2014  . GERD (gastroesophageal reflux disease) 12/11/2013  . HTN (hypertension) 12/11/2013  . Hyperlipidemia 12/11/2013  . Tobacco abuse 12/11/2013    Past Surgical History:  Procedure Laterality Date  . CARDIAC SURGERY    . CORONARY ANGIOPLASTY WITH STENT PLACEMENT    . RIGHT/LEFT HEART CATH AND CORONARY ANGIOGRAPHY N/A 12/31/2017   Procedure: RIGHT/LEFT HEART  CATH AND CORONARY ANGIOGRAPHY;  Surgeon: Isaias Cowman, MD;  Location: [facility];  Service: Cardiovascular;  Laterality: N/A;    Prior to Admission medications   Medication Sig Start Date End Date Taking? Authorizing Provider  ALPRAZolam Duanne Moron) 0.5 MG tablet Take 0.5 mg by mouth 2 (two) times daily as needed. 09/18/19   [provider]  amLODipine (NORVASC) 10 MG tablet Take 10 mg by mouth daily.    [provider]  aspirin 81 MG tablet Take 81 mg by mouth daily.    [provider]  Cholecalciferol 50 MCG (2000 UT) CAPS Take 3 capsules daily for 3 months, then reduce to 1 capsule daily thereafter for Vitamin D Deficiency. 04/09/19   [provider]  Clotrimazole 1 % OINT Apply to area twice daily for 10-14 days 01/05/20   Jodell Cipro, Eliezer Lofts L, PA-C  cyanocobalamin 1000 MCG tablet Take 2 tablets daily for 2 weeks, then reduce to 1 tablet daily thereafter for Vitamin B12 Deficiency. 04/09/19   [provider]  hydrochlorothiazide (HYDRODIURIL) 25 MG tablet Take 25 mg by mouth daily.    [provider]  lisinopril (PRINIVIL,ZESTRIL) 40 MG tablet Take 40 mg by mouth daily.     [provider]  metFORMIN (GLUCOPHAGE) 500 MG tablet Take by mouth. 04/09/19 04/08/20  [provider]  metoprolol succinate (TOPROL-XL) 25 MG 24 hr tablet Take 25 mg by mouth daily.    [provider]  Omega-3 Fatty Acids (FISH OIL) 1200 MG CAPS Take by mouth.    [provider]  omeprazole (PRILOSEC) 20  MG capsule Take 20-40 mg by mouth daily as needed.     [provider]  prasugrel (EFFIENT) 10 MG TABS tablet Take 10 mg by mouth daily.    [provider]  sildenafil (REVATIO) 20 MG tablet SMARTSIG:3 Tablet(s) By Mouth PRN 08/20/19   [provider]  simvastatin (ZOCOR) 40 MG tablet Take 20 mg by mouth daily.     [provider]     Allergies Patient has no known allergies.  Family History    Problem Relation Age of Onset  . Gout Brother     Social History Social History   Tobacco Use  . Smoking status: Former Smoker    Types: E-cigarettes  . Smokeless tobacco: Never Used  Substance Use Topics  . Alcohol use: Yes    Alcohol/week: 10.0 standard drinks    Types: 10 Cans of beer per week  . Drug use: Never    Review of Systems Constitutional: No fever/chills Eyes: No visual changes.  ENT: No sore throat. Cardiovascular: As above Respiratory: Denies shortness of breath. Gastrointestinal: No abdominal pain.  No nausea, no vomiting.   Genitourinary: Negative for dysuria. Musculoskeletal: Negative for back pain. Skin: Negative for rash. Neurological: Negative for headaches or weakness   ____________________________________________   PHYSICAL EXAM:  VITAL SIGNS: ED Triage Vitals  Enc Vitals Group     BP 02/03/20 1241 (!) 119/104     Pulse Rate 02/03/20 1241 67     Resp 02/03/20 1241 18     Temp 02/03/20 1243 98.5 F (36.9 C)     Temp Source 02/03/20 1241 Oral     SpO2 02/03/20 1241 98 %     Weight 02/03/20 1241 84.8 kg (187 lb)     Height 02/03/20 1241 1.702 m (5\' 7" )     Head Circumference --      Peak Flow --      Pain Score 02/03/20 1241 0     Pain Loc --      Pain Edu? --      Excl. in [place]? --     Constitutional: Alert and oriented. No acute distress.   Mouth/Throat: Mucous membranes are moist.   Neck:  Painless ROM Cardiovascular: Normal rate, regular rhythm. Grossly normal heart sounds.  Good peripheral circulation. Respiratory: Normal respiratory effort.  No retractions. Lungs CTAB. Gastrointestinal: Soft and nontender. No distention.  No CVA tenderness.  Musculoskeletal: No lower extremity tenderness nor edema.  Warm and well perfused Neurologic:  Normal speech and language. No gross focal neurologic deficits are appreciated.  Skin:  Skin is warm, dry and intact. No rash noted. Psychiatric: Mood and affect are normal. Speech and behavior  are normal.  ____________________________________________   LABS (all labs ordered are listed, but only abnormal results are displayed)  Labs Reviewed  BASIC METABOLIC PANEL - Abnormal; Notable for the following components:      Result Value   Glucose, Bld 176 (*)    BUN 28 (*)    Creatinine, Ser 1.42 (*)    GFR calc non Af Amer 54 (*)    All other components within normal limits  CBC - Abnormal; Notable for the following components:   RBC 3.99 (*)    Hemoglobin 11.3 (*)    HCT 36.4 (*)    All other components within normal limits  TROPONIN I (HIGH SENSITIVITY)  TROPONIN I (HIGH SENSITIVITY)   ____________________________________________  EKG  ED ECG REPORT I, Lavonia Drafts, the attending physician, personally viewed and  interpreted this ECG.  Date: 02/03/2020  Rhythm: normal sinus rhythm QRS Axis: normal Intervals: normal ST/T Wave abnormalities: Nonspecific changes Narrative Interpretation: no evidence of acute ischemia  ____________________________________________  RADIOLOGY  Chest x-ray reviewed by me, negative for acute abnormalities ____________________________________________   PROCEDURES  Procedure(s) performed: No  Procedures   Critical Care performed: No ____________________________________________   INITIAL IMPRESSION / ASSESSMENT AND PLAN / ED COURSE  Pertinent labs & imaging results that were available during my care of the patient were reviewed by me and considered in my medical decision making (see chart for details).  Patient with a history of coronary artery disease presents with complaints of chest discomfort and dizziness with possible syncopal episode. Well-appearing here in the emergency department, vital signs are overall reassuring. Initial troponin of 15, electrolytes are overall reassuring, mild elevation of BUN to creatinine ratio suggesting some dehydration. Twelve-lead is reassuring.  Patient is receiving IV fluids  He  notes that chest discomfort is quite common for him as his episodes of dizziness with exertion.  He does have follow-up with cardiology scheduled an echocardiogram planned.  Does report compliance with his medications.    Pending second troponin, if no significant change will require reevaluation and likely appropriate for discharge    ____________________________________________   FINAL CLINICAL IMPRESSION(S) / ED DIAGNOSES  Final diagnoses:  Chest pain, unspecified type        Note:  This document was prepared using Dragon voice recognition software and may include unintentional dictation errors.   Lavonia Drafts, MD 02/03/20 [phone]

## 2020-02-03 NOTE — ED Notes (Signed)
MD notified of troponin increase.

## 2020-02-03 NOTE — Consult Note (Signed)
[address] for Heparin Indication: chest pain/ACS  No Known Allergies  Patient Measurements: Height: 5\' 7"  (170.2 cm) Weight: 84.8 kg (187 lb) IBW/kg (Calculated) : 66.1 Heparin Dosing Weight: 83.3 kg  Vital Signs: Temp: 98.5 F (36.9 C) (06/03 1243) Temp Source: Oral (06/03 1241) BP: 126/68 (06/03 1730) Pulse Rate: 63 (06/03 1730)  Labs: Recent Labs    02/03/20 1244 02/03/20 1501  HGB 11.3*  --   HCT 36.4*  --   PLT 216  --   CREATININE 1.42*  --   TROPONINIHS 15 51*    Estimated Creatinine Clearance: 59.7 mL/min (A) (by C-G formula based on SCr of 1.42 mg/dL (H)).   Medical History: Past Medical History:  Diagnosis Date   Coronary artery disease    High cholesterol    Hypertension    Myocardial infarct, old    Plantar fasciitis     Medications:  (Not in a hospital admission)  Scheduled:   aspirin  324 mg Oral Once   insulin aspart  0-5 Units Subcutaneous QHS   [START ON 02/04/2020] insulin aspart  0-9 Units Subcutaneous TID WC   sodium chloride flush  3 mL Intravenous Once   Infusions:   PRN:  Anti-infectives (From admission, onward)   None      Assessment: Pharmacy consulted to start heparin with no bolus per MD for ACS. No DOAC PTA noted.    Goal of Therapy:  Heparin level 0.3-0.7 units/ml Monitor platelets by anticoagulation protocol: Yes   Plan:  Start heparin infusion at 1150 units/hr Check anti-Xa level in 6 hours and daily while on heparin Continue to monitor H&H and platelets  Oswald Hillock, PharmD, BCPS 02/03/2020,6:14 PM

## 2020-02-03 NOTE — Progress Notes (Signed)
Cross Cover Brief Note Initial troponin 15 increased to 51. Serial evaluations show flat trend with  Most recent 47

## 2020-02-03 NOTE — ED Notes (Signed)
Pt given graham crackers and peanut butter per request.  Pt comfortable, no other needs at this time.

## 2020-02-03 NOTE — ED Triage Notes (Signed)
Pt comes into the ED via EMS from work with c/o having chest pain with dizziness and had a syncope episode lasting several minutes. Pt is a/ox4 on arrival, states he has been having daily chest pain with dizziness and has an appt with Dr. Anitra Lauth 6/17. Pt has a hx of 3 cardiac stents. Denies any pain at present. Was given 324mg  ASA and 1 SL nitro.

## 2020-02-03 NOTE — ED Notes (Signed)
Pt IV in the left wrist reinforced with tape.

## 2020-02-04 ENCOUNTER — Inpatient Hospital Stay
Admit: 2020-02-04 | Discharge: 2020-02-04 | Disposition: A | Discharge status: Home / Self Care | Payer: Commercial Managed Care - PPO | Attending: Cardiology | Admitting: Cardiology

## 2020-02-04 DIAGNOSIS — E1169 Type 2 diabetes mellitus with other specified complication: Secondary | ICD-10-CM | POA: Diagnosis not present

## 2020-02-04 DIAGNOSIS — I350 Nonrheumatic aortic (valve) stenosis: Secondary | ICD-10-CM

## 2020-02-04 DIAGNOSIS — I10 Essential (primary) hypertension: Secondary | ICD-10-CM | POA: Diagnosis not present

## 2020-02-04 LAB — CBC
HCT: 35 % — ABNORMAL LOW (ref 39.0–52.0)
Hemoglobin: 11 g/dL — ABNORMAL LOW (ref 13.0–17.0)
MCH: 28.2 pg (ref 26.0–34.0)
MCHC: 31.4 g/dL (ref 30.0–36.0)
MCV: 89.7 fL (ref 80.0–100.0)
Platelets: 222 10*3/uL (ref 150–400)
RBC: 3.9 MIL/uL — ABNORMAL LOW (ref 4.22–5.81)
RDW: 13.7 % (ref 11.5–15.5)
WBC: 7.7 10*3/uL (ref 4.0–10.5)
nRBC: 0 % (ref 0.0–0.2)

## 2020-02-04 LAB — MAGNESIUM: Magnesium: 2 mg/dL (ref 1.7–2.4)

## 2020-02-04 LAB — GLUCOSE, CAPILLARY: Glucose-Capillary: 125 mg/dL — ABNORMAL HIGH (ref 70–99)

## 2020-02-04 LAB — HIV ANTIBODY (ROUTINE TESTING W REFLEX): HIV Screen 4th Generation wRfx: NONREACTIVE

## 2020-02-04 LAB — CREATININE, SERUM
Creatinine, Ser: 0.88 mg/dL (ref 0.61–1.24)
GFR calc Af Amer: >60 mL/min (ref >60)
GFR calc non Af Amer: >60 mL/min (ref >60)

## 2020-02-04 LAB — ECHOCARDIOGRAM COMPLETE
Height: 67 in
Weight: 2992 oz

## 2020-02-04 LAB — HEPARIN LEVEL (UNFRACTIONATED): Heparin Unfractionated: 0.85 IU/mL — ABNORMAL HIGH (ref 0.30–0.70)

## 2020-02-04 LAB — TROPONIN I (HIGH SENSITIVITY): Troponin I (High Sensitivity): 26 ng/L — ABNORMAL HIGH (ref <18)

## 2020-02-04 MED ORDER — ENOXAPARIN SODIUM 40 MG/0.4ML ~~LOC~~ SOLN: 40.0000 mg | SUBCUTANEOUS | Status: DC

## 2020-02-04 MED ORDER — VITAMIN B-12 1000 MCG PO TABS
1000.0000 ug | ORAL_TABLET | Freq: Every day | ORAL | Status: DC
  Administered 2020-02-04: 1000 ug via ORAL
  Filled 2020-02-04: qty 1

## 2020-02-04 MED ORDER — VITAMIN D 25 MCG (1000 UNIT) PO TABS
1000.0000 [IU] | ORAL_TABLET | Freq: Every day | ORAL | Status: DC
  Administered 2020-02-04: 1000 [IU] via ORAL
  Filled 2020-02-04: qty 1

## 2020-02-04 MED ORDER — AMLODIPINE BESYLATE 5 MG PO TABS
10.0000 mg | ORAL_TABLET | Freq: Every day | ORAL | Status: DC
  Administered 2020-02-04: 10 mg via ORAL
  Filled 2020-02-04: qty 2

## 2020-02-04 MED ORDER — LISINOPRIL 10 MG PO TABS
40.0000 mg | ORAL_TABLET | Freq: Every day | ORAL | Status: DC
  Administered 2020-02-04: 40 mg via ORAL
  Filled 2020-02-04: qty 4

## 2020-02-04 MED ORDER — HYDROCHLOROTHIAZIDE 25 MG PO TABS
25.0000 mg | ORAL_TABLET | Freq: Every day | ORAL | Status: DC
  Administered 2020-02-04: 25 mg via ORAL
  Filled 2020-02-04: qty 1

## 2020-02-04 MED ORDER — SIMVASTATIN 10 MG PO TABS
40.0000 mg | ORAL_TABLET | Freq: Every day | ORAL | Status: DC
  Filled 2020-02-04: qty 4

## 2020-02-04 MED ORDER — PRASUGREL HCL 10 MG PO TABS
10.0000 mg | ORAL_TABLET | Freq: Every day | ORAL | Status: DC
  Administered 2020-02-04: 10 mg via ORAL
  Filled 2020-02-04: qty 1

## 2020-02-04 MED ORDER — METOPROLOL SUCCINATE ER 50 MG PO TB24
25.0000 mg | ORAL_TABLET | Freq: Every day | ORAL | Status: DC
  Filled 2020-02-04: qty 1

## 2020-02-04 MED ORDER — ONDANSETRON HCL 4 MG/2ML IJ SOLN: 4.0000 mg | Freq: Four times a day (QID) | INTRAMUSCULAR | Status: DC | PRN

## 2020-02-04 MED ORDER — ASPIRIN 300 MG RE SUPP: 300.0000 mg | RECTAL | Status: AC

## 2020-02-04 MED ORDER — ASPIRIN EC 81 MG PO TBEC
81.0000 mg | DELAYED_RELEASE_TABLET | Freq: Every day | ORAL | Status: DC
  Filled 2020-02-04: qty 1

## 2020-02-04 MED ORDER — ACETAMINOPHEN 325 MG PO TABS: 650.0000 mg | ORAL_TABLET | ORAL | Status: DC | PRN

## 2020-02-04 MED ORDER — NITROGLYCERIN 0.4 MG SL SUBL: 0.4000 mg | SUBLINGUAL_TABLET | SUBLINGUAL | Status: DC | PRN

## 2020-02-04 MED ORDER — ASPIRIN 81 MG PO TABS: 81.0000 mg | ORAL_TABLET | Freq: Every day | ORAL | Status: DC

## 2020-02-04 MED ORDER — ASPIRIN 81 MG PO CHEW
324.0000 mg | CHEWABLE_TABLET | ORAL | Status: AC
  Administered 2020-02-04: 324 mg via ORAL
  Filled 2020-02-04: qty 4

## 2020-02-04 NOTE — Consult Note (Signed)
ANTICOAGULATION CONSULT NOTE  Pharmacy Consult for Heparin Indication: chest pain/ACS  No Known Allergies  Patient Measurements: Height: 5\' 7"  (170.2 cm) Weight: 84.8 kg (187 lb) IBW/kg (Calculated) : 66.1 Heparin Dosing Weight: 83.3 kg  Vital Signs: BP: 128/63 (06/04 0000) Pulse Rate: 61 (06/04 0000)  Labs: Recent Labs    02/03/20 1244 02/03/20 1244 02/03/20 1501 02/03/20 1824 02/03/20 1926 02/03/20 2125 02/04/20 0326  HGB 11.3*  --   --   --   --   --   --   HCT 36.4*  --   --   --   --   --   --   PLT 216  --   --   --   --   --   --   APTT  --   --   --  30  --   --   --   LABPROT  --   --   --  14.6  --   --   --   INR  --   --   --  1.2  --   --   --   HEPARINUNFRC  --   --   --   --   --   --  0.85*  CREATININE 1.42*  --   --   --   --   --   --   TROPONINIHS 15   < > 51*  --  57* 47*  --    < > = values in this interval not displayed.    Estimated Creatinine Clearance: 59.7 mL/min (A) (by C-G formula based on SCr of 1.42 mg/dL (H)).   Medical History: Past Medical History:  Diagnosis Date  . Coronary artery disease   . High cholesterol   . Hypertension   . Myocardial infarct, old   . Plantar fasciitis     Medications:  (Not in a hospital admission)  Scheduled:  . aspirin  324 mg Oral Once  . insulin aspart  0-5 Units Subcutaneous QHS  . insulin aspart  0-9 Units Subcutaneous TID WC  . sodium chloride flush  3 mL Intravenous Once   Infusions:  . heparin 1,150 Units/hr (02/03/20 1921)   PRN:  Anti-infectives (From admission, onward)   None      Assessment: Pharmacy consulted to start heparin with no bolus per MD for ACS. No DOAC PTA noted.    Goal of Therapy:  Heparin level 0.3-0.7 units/ml Monitor platelets by anticoagulation protocol: Yes   Plan:  06/04 @ 0330 HL 0.85 supratherapeutic. Will reduce rate to 1000 units/hr and will recheck HL at 0900 and continue to monitor.  Tobie Lords, PharmD, BCPS 02/04/2020,4:33 AM

## 2020-02-04 NOTE — Consult Note (Addendum)
Cardiology Consultation Note    Patient ID: Curtis Maxwell, MRN: [id number], DOB/AGE: March 06, 1961 59 y.o. Admit date: 02/03/2020   Date of Consult: 02/04/2020 Primary Physician: [facility] Primary Cardiologist: Dr. Saralyn Pilar  Chief Complaint: chest pain Reason for Consultation: chest pain Requesting MD: Dr. Fritzi Mandes  HPI: Curtis Maxwell is a 59 y.o. male with history of coronary artery disease status post PCI of OM2 and OM1 in 2011, status post redo stent in the OM 2 and POBA of the OM1 for in-stent restenosis, chronically occluded RCA, hypertension, hyperlipidemia and moderate aortic stenosis with an aortic valve area of 1.33 cm with a mean gradient of 24 mmHg by echo in 2019.  He presented to emergency room complaining of chest pain and suffering an apparent syncopal episode after some heavy exertion at his place of employment.  Bystanders did CPR however it is unclear whether the patient never completely lost consciousness.  Patient has had chest discomfort and shortness of breath ever since his cardiac cath in 2019.  Symptoms have not progressed to any great amount.  He has been compliant with his medications.  Most recent echo was in 2019.  He has ruled out for myocardial infarction with flat troponins.  He currently is symptom-free and hemodynamically stable.  Past Medical History:  Diagnosis Date   Coronary artery disease    High cholesterol    Hypertension    Myocardial infarct, old    Plantar fasciitis       Surgical History:  Past Surgical History:  Procedure Laterality Date   CARDIAC SURGERY     CORONARY ANGIOPLASTY WITH STENT PLACEMENT     RIGHT/LEFT HEART CATH AND CORONARY ANGIOGRAPHY N/A 12/31/2017   Procedure: RIGHT/LEFT HEART CATH AND CORONARY ANGIOGRAPHY;  Surgeon: Isaias Cowman, MD;  Location: [facility];  Service: Cardiovascular;  Laterality: N/A;     Home Meds: Prior to Admission medications   Medication Sig Start Date End Date  Taking? Authorizing Provider  amLODipine (NORVASC) 10 MG tablet Take 10 mg by mouth daily.   Yes [provider]  aspirin 81 MG tablet Take 81 mg by mouth daily.   Yes [provider]  Cholecalciferol 50 MCG (2000 UT) CAPS Take by mouth daily.  04/09/19  Yes [provider]  cyanocobalamin 1000 MCG tablet Take 1,000 mcg by mouth daily.  04/09/19  Yes [provider]  hydrochlorothiazide (HYDRODIURIL) 25 MG tablet Take 25 mg by mouth daily.   Yes [provider]  lisinopril (PRINIVIL,ZESTRIL) 40 MG tablet Take 40 mg by mouth daily.    Yes [provider]  metFORMIN (GLUCOPHAGE) 500 MG tablet Take by mouth. 04/09/19 04/08/20 Yes [provider]  metoprolol succinate (TOPROL-XL) 25 MG 24 hr tablet Take 25 mg by mouth daily.   Yes [provider]  Omega-3 Fatty Acids (FISH OIL) 1200 MG CAPS Take by mouth.   Yes [provider]  omeprazole (PRILOSEC) 20 MG capsule Take 20-40 mg by mouth daily as needed.    Yes [provider]  prasugrel (EFFIENT) 10 MG TABS tablet Take 10 mg by mouth daily.   Yes [provider]  sildenafil (REVATIO) 20 MG tablet SMARTSIG:3 Tablet(s) By Mouth PRN 08/20/19  Yes [provider]  simvastatin (ZOCOR) 20 MG tablet Take 20 mg by mouth daily.    Yes [provider]    Inpatient Medications:   amLODipine  10 mg Oral Daily   aspirin EC  81 mg  Oral Daily   cholecalciferol  1,000 Units Oral Daily   hydrochlorothiazide  25 mg Oral Daily   insulin aspart  0-5 Units Subcutaneous QHS   insulin aspart  0-9 Units Subcutaneous TID WC   lisinopril  40 mg Oral Daily   metoprolol succinate  25 mg Oral Daily   prasugrel  10 mg Oral Daily   simvastatin  40 mg Oral Daily   sodium chloride flush  3 mL Intravenous Once   cyanocobalamin  1,000 mcg Oral Daily    heparin 1,000 Units/hr (02/04/20 0440)    Allergies: No Known Allergies  Social History    Socioeconomic History   Marital status: Married    Spouse name: Not on file   Number of children: Not on file   Years of education: Not on file   Highest education level: Not on file  Occupational History   Not on file  Tobacco Use   Smoking status: Former Smoker    Types: E-cigarettes   Smokeless tobacco: Never Used  Substance and Sexual Activity   Alcohol use: Yes    Alcohol/week: 10.0 standard drinks    Types: 10 Cans of beer per week   Drug use: Never   Sexual activity: Yes    Partners: Female  Other Topics Concern   Not on file  Social History Narrative   Not on file   Social Determinants of Health   Financial Resource Strain:    Difficulty of Paying Living Expenses:   Food Insecurity:    Worried About Charity fundraiser in the Last Year:    Arboriculturist in the Last Year:   Transportation Needs:    Film/video editor (Medical):    Lack of Transportation (Non-Medical):   Physical Activity:    Days of Exercise per Week:    Minutes of Exercise per Session:   Stress:    Feeling of Stress :   Social Connections:    Frequency of Communication with Friends and Family:    Frequency of Social Gatherings with Friends and Family:    Attends Religious Services:    Active Member of Clubs or Organizations:    Attends Music therapist:    Marital Status:   Intimate Partner Violence:    Fear of Current or Ex-Partner:    Emotionally Abused:    Physically Abused:    Sexually Abused:      Family History  Problem Relation Age of Onset   Gout Brother      Review of Systems: A 12-system review of systems was performed and is negative except as noted in the HPI.  Labs: No results for input(s): CKTOTAL, CKMB, TROPONINI in the last 72 hours. Lab Results  Component Value Date   WBC 7.7 02/04/2020   HGB 11.0 (L) 02/04/2020   HCT 35.0 (L) 02/04/2020   MCV 89.7 02/04/2020   PLT 222 02/04/2020    Recent Labs  Lab  02/03/20 1244 02/03/20 1244 02/04/20 0511  NA 141  --   --   K 4.8  --   --   CL 108  --   --   CO2 23  --   --   BUN 28*  --   --   CREATININE 1.42*   < > 0.88  CALCIUM 9.0  --   --   GLUCOSE 176*  --   --    < > = values in this interval not displayed.   No results  found for: CHOL, HDL, LDLCALC, TRIG No results found for: DDIMER  Radiology/Studies:  DG Chest 2 View  Result Date: 02/03/2020 CLINICAL DATA:  Chest pain. EXAM: CHEST - 2 VIEW COMPARISON:  Chest x-ray 03/12/2010. FINDINGS: Mediastinum and hilar structures normal. Heart size normal. Mild bibasilar subsegmental atelectasis. Mild infiltrates cannot be excluded. No pleural effusion or pneumothorax. IMPRESSION: Mild bibasilar subsegmental atelectasis. Mild infiltrates cannot be excluded. Electronically Signed   By: Marcello Moores  Register   On: 02/03/2020 13:31    Wt Readings from Last 3 Encounters:  02/03/20 84.8 kg  12/31/17 87.5 kg  08/19/15 89.8 kg    EKG: Sinus rhythm with no ischemia  Physical Exam:  Blood pressure 122/63, pulse 68, temperature 98.5 F (36.9 C), resp. rate 17, height 5\' 7"  (1.702 m), weight 84.8 kg, SpO2 99 %. Body mass index is 29.29 kg/m. General: Well developed, well nourished, in no acute distress. Head: Normocephalic, atraumatic, sclera non-icteric, no xanthomas, nares are without discharge.  Neck: Negative for carotid bruits. JVD not elevated. Lungs: Clear bilaterally to auscultation without wheezes, rales, or rhonchi. Breathing is unlabored. Heart: RRR with S1 S2.  2/6 to 3/6 systolic murmur radiating to the outflow tract in his carotids.  No audible diastolic murmur today Abdomen: Soft, non-tender, non-distended with normoactive bowel sounds. No hepatomegaly. No rebound/guarding. No obvious abdominal masses. Msk:  Strength and tone appear normal for age. Extremities: No clubbing or cyanosis. No edema.  Distal pedal pulses are 2+ and equal bilaterally. Neuro: Alert and oriented X 3. No facial  asymmetry. No focal deficit. Moves all extremities spontaneously. Psych:  Responds to questions appropriately with a normal affect.     Assessment and Plan  60 year old male with history of coronary disease status post PCI of the OM 2 and OM1 in 2011 with a redo stenting and POBA later that year with a chronically occluded RCA and noncritical disease in his LAD as well as mild to moderate aortic stenosis with a valve area of 1.33 cm with a mean gradient of 24 mmHg in 2019.  He was admitted after an apparent syncopal episode at his place of work after doing some heavy lifting.  Patient has had chest tightness and dizziness pretty much since his cath in 2019.  Symptoms have waxed and waned.  He does have aortic valve disease which may be playing a role.  He appears to have ruled out for myocardial infarction by symptoms, EKG and high-sensitivity troponin.  Will discontinue heparin and defer cardiac catheterization at this time.  We will proceed with an echocardiogram to reevaluate his aortic valve.  Plan to discharge later today after echo is completed with early outpatient follow-up with Dr. Saralyn Pilar to continue to work-up his symptoms and his aortic valve disease.  Signed, Teodoro Spray MD 02/04/2020, 7:13 AM Pager: [phone]   Addendum: Echocardiogram reveals severe aortic stenosis with greater than 4 m/s peak flow.  Mean gradient is greater than 40 mmHg and valve area 0.5 to 0.6 cm.  Patient is hemodynamically stable and not in heart failure.  Okay to discharge to home.  Should not work at all until further evaluation regarding his aortic valve which will be carried out early next week at [facility].  Okay for discharge from a cardiac standpoint with no work as mentioned above.  We will follow-up in cardiology clinic Monday or Tuesday of next week.  Continue with current medications that he was on at time of presentation.

## 2020-02-04 NOTE — Discharge Summary (Signed)
Physician Discharge Summary  ATWOOD ADCOCK BMW:[id number] DOB: 12-15-1960 DOA: 02/03/2020  PCP: [place] date: 02/03/2020 Discharge date: 02/04/2020  Admitted From: home Disposition:  home  Recommendations for Outpatient Follow-up:  1. Follow up with PCP in 1-2 weeks 2. F/u cardio, Dr. Saralyn Pilar, in 3-4 days  Home Health: no Equipment/Devices:  Discharge Condition: stable CODE STATUS: full  Diet recommendation: Heart Healthy / Carb Modified   Brief/Interim Summary: HPI was taken from Dr. Serita Grit: Curtis Maxwell  is a 59 y.o. male with a known history of coronary artery disease status post stent in the past, hypertension, hyperlipidemia,gerd comes to the emergency room after he reports passing out at work. He has been having chest pain mid substernal and epigastric area on and off for last several days. Patient says his coworkers did chest compression.  He was seen by Dr Josefa Half about a month ago. Patient has had cardiac cath in the past 2019revealed occluded proximal RCA with collaterals, diffuse 60% in-stent restenosis small caliber OM1, and 6% stenosis D1, with moderate aortic stenosis, aortic valve area 1.33 cm   ED course: in the ER patient is hemodynamically stable. EKG shows sinus rhythm with old Q waves and possible lateral wall ischemia. His first troponin was 15-- repeat was 51 patient received for baby aspirin's in route via EMS.  Hospital Course from Dr. Lenise Herald 02/04/20: Pt presented w/ chest pain that was thought to be secondary to severe aortic stenosis. Pt did not require any acute inpatient work-up as per cardio but pt will f/u w/ Dr. Saralyn Pilar in 3-4 days as an outpatient for further evaluation and treatment. Pt verbalized his understanding. NSTEMI was r/o. Pt may not return to work until he is cleared by his cardiologist and a work note was given to the pt. Pt verbalized his understanding.    Discharge Diagnoses:  Active Problems:   Unstable  angina (HCC)   Aortic stenosis  Unstable angina:  NSTEMI r/o. D/c IV heparin drip as per cardio. Continue on aspirin, effient, metoprolol, & statin. Will f/u w/ Dr. Josefa Half outpatient   Severe aortic stenosis: management per cardio as an outpatient. May not return to work until cleared by cardio   DM2: continue to hold metformin while inpatient. Continue on SSI w/ accuchecks  Hyperlipidemia: continue statin  Hypertension: continue HCTZ, metoprolol, lisinopril   Discharge Instructions  Discharge Instructions    Diet - low sodium heart healthy   Complete by: As directed    Diet Carb Modified   Complete by: As directed    Discharge instructions   Complete by: As directed    F/u cardio, Dr. Saralyn Pilar, in 3-4 days; No working until cleared by cardiologist   Increase activity slowly   Complete by: As directed      Allergies as of 02/04/2020   No Known Allergies     Medication List    TAKE these medications   amLODipine 10 MG tablet Commonly known as: NORVASC Take 10 mg by mouth daily.   aspirin 81 MG tablet Take 81 mg by mouth daily.   Cholecalciferol 50 MCG (2000 UT) Caps Take by mouth daily.   cyanocobalamin 1000 MCG tablet Take 1,000 mcg by mouth daily.   Fish Oil 1200 MG Caps Take by mouth.   hydrochlorothiazide 25 MG tablet Commonly known as: HYDRODIURIL Take 25 mg by mouth daily.   lisinopril 40 MG tablet Commonly known as: ZESTRIL Take 40 mg by mouth daily.   metFORMIN 500  MG tablet Commonly known as: GLUCOPHAGE Take by mouth.   metoprolol succinate 25 MG 24 hr tablet Commonly known as: TOPROL-XL Take 25 mg by mouth daily.   omeprazole 20 MG capsule Commonly known as: PRILOSEC Take 20-40 mg by mouth daily as needed.   prasugrel 10 MG Tabs tablet Commonly known as: EFFIENT Take 10 mg by mouth daily.   sildenafil 20 MG tablet Commonly known as: REVATIO SMARTSIG:3 Tablet(s) By Mouth PRN   simvastatin 20 MG tablet Commonly known as:  ZOCOR Take 20 mg by mouth daily.      Follow-up Information    Paraschos, Alexander, MD Follow up in 1 week(s).   Specialty: Cardiology Contact information: [facility] [place] [address] [phone]          No Known Allergies  Consultations: Cardio, Dr. Ubaldo Glassing   Procedures/Studies: DG Chest 2 View  Result Date: 02/03/2020 CLINICAL DATA:  Chest pain. EXAM: CHEST - 2 VIEW COMPARISON:  Chest x-ray 03/12/2010. FINDINGS: Mediastinum and hilar structures normal. Heart size normal. Mild bibasilar subsegmental atelectasis. Mild infiltrates cannot be excluded. No pleural effusion or pneumothorax. IMPRESSION: Mild bibasilar subsegmental atelectasis. Mild infiltrates cannot be excluded. Electronically Signed   By: Marcello Moores  Register   On: 02/03/2020 13:31   ECHOCARDIOGRAM COMPLETE  Result Date: 02/04/2020    ECHOCARDIOGRAM REPORT   Patient Name:   Curtis Maxwell Date of Exam: 02/04/2020 Medical Rec #:  [id number]      Height:       67.0 in Accession #:    [id number]     Weight:       187.0 lb Date of Birth:  09-22-1960      BSA:          1.965 m Patient Age:    59 years       BP:           122/63 mmHg Patient Gender: M              HR:           68 bpm. Exam Location:  [facility] Procedure: 2D Echo, Cardiac Doppler and Color Doppler Indications:     Aortic valve stenosis 424.1  History:         Patient has no prior history of Echocardiogram examinations.                  Previous Myocardial Infarction and CAD; Risk                  Factors:Hypertension.  Sonographer:     Sherrie Sport RDCS (AE) Referring Phys:  Powers Diagnosing Phys: Bartholome Bill MD IMPRESSIONS  1. Left ventricular ejection fraction, by estimation, is 65 to 70%. The left ventricle has normal function. The left ventricle has no regional wall motion abnormalities. There is moderate left ventricular hypertrophy. Left ventricular diastolic parameters are consistent with Grade I diastolic  dysfunction (impaired relaxation).  2. Right ventricular systolic function is normal. The right ventricular size is mildly enlarged. There is normal pulmonary artery systolic pressure.  3. Left atrial size was mildly dilated.  4. The mitral valve is grossly normal. Mild mitral valve regurgitation.  5. The aortic valve is abnormal. Aortic valve regurgitation is mild. Severe aortic valve stenosis. Aortic valve area, by VTI measures 0.52 cm. Aortic valve mean gradient measures 41.6 mmHg. Aortic valve Vmax measures 4.18 m/s. FINDINGS  Left Ventricle: Left ventricular ejection fraction, by estimation, is  65 to 70%. The left ventricle has normal function. The left ventricle has no regional wall motion abnormalities. The left ventricular internal cavity size was normal in size. There is  moderate left ventricular hypertrophy. Left ventricular diastolic parameters are consistent with Grade I diastolic dysfunction (impaired relaxation). Right Ventricle: The right ventricular size is mildly enlarged. No increase in right ventricular wall thickness. Right ventricular systolic function is normal. There is normal pulmonary artery systolic pressure. The tricuspid regurgitant velocity is 2.46  m/s, and with an assumed right atrial pressure of 10 mmHg, the estimated right ventricular systolic pressure is 36.4 mmHg. Left Atrium: Left atrial size was mildly dilated. Right Atrium: Right atrial size was normal in size. Pericardium: There is no evidence of pericardial effusion. Mitral Valve: The mitral valve is grossly normal. Mild mitral valve regurgitation. Tricuspid Valve: The tricuspid valve is grossly normal. Tricuspid valve regurgitation is mild. Aortic Valve: The aortic valve is abnormal. . There is moderate thickening and severe calcifcation of the aortic valve. Aortic valve regurgitation is mild. Severe aortic stenosis is present. There is moderate thickening of the aortic valve. There is severe calcifcation of the aortic  valve. Aortic valve mean gradient measures 41.6 mmHg. Aortic valve peak gradient measures 69.8 mmHg. Aortic valve area, by VTI measures 0.52 cm. Pulmonic Valve: The pulmonic valve was not well visualized. Pulmonic valve regurgitation is not visualized. Aorta: The aortic root is normal in size and structure. IAS/Shunts: The interatrial septum was not assessed.  LEFT VENTRICLE PLAX 2D LVIDd:         5.16 cm  Diastology LVIDs:         2.90 cm  LV e' lateral:   9.82 cm/s LV PW:         1.21 cm  LV E/e' lateral: 10.4 LV IVS:        0.95 cm  LV e' medial:    7.19 cm/s LVOT diam:     2.00 cm  LV E/e' medial:  14.2 LV SV:         46 LV SV Index:   23 LVOT Area:     3.14 cm  RIGHT VENTRICLE RV Basal diam:  4.03 cm RV S prime:     14.00 cm/s TAPSE (M-mode): 4.2 cm LEFT ATRIUM             Index       RIGHT ATRIUM           Index LA diam:        4.90 cm 2.49 cm/m  RA Area:     17.60 cm LA Vol (A2C):   83.6 ml 42.54 ml/m RA Volume:   45.30 ml  23.05 ml/m LA Vol (A4C):   57.9 ml 29.46 ml/m LA Biplane Vol: 75.4 ml 38.37 ml/m  AORTIC VALVE                    PULMONIC VALVE AV Area (Vmax):    0.44 cm     PV Vmax:        0.90 m/s AV Area (Vmean):   0.47 cm     PV Peak grad:   3.2 mmHg AV Area (VTI):     0.52 cm     RVOT Peak grad: 4 mmHg AV Vmax:           417.80 cm/s AV Vmean:          299.800 cm/s AV VTI:  0.879 m AV Peak Grad:      69.8 mmHg AV Mean Grad:      41.6 mmHg LVOT Vmax:         58.10 cm/s LVOT Vmean:        44.700 cm/s LVOT VTI:          0.146 m LVOT/AV VTI ratio: 0.17  AORTA Ao Root diam: 2.80 cm MITRAL VALVE                TRICUSPID VALVE MV Area (PHT): 3.87 cm     TR Peak grad:   24.2 mmHg MV Decel Time: 196 msec     TR Vmax:        246.00 cm/s MV E velocity: 102.00 cm/s MV A velocity: 68.00 cm/s   SHUNTS MV E/A ratio:  1.50         Systemic VTI:  0.15 m                             Systemic Diam: 2.00 cm Bartholome Bill MD Electronically signed by Bartholome Bill MD Signature Date/Time:  02/04/2020/9:56:19 AM    Final       Subjective: pt c/o fatigue    Discharge Exam: Vitals:   02/04/20 1053 02/04/20 1123  BP: 136/78 137/73  Pulse: 72 63  Resp: 18 14  Temp:  98.7 F (37.1 C)  SpO2: 98% 99%   Vitals:   02/04/20 0539 02/04/20 0939 02/04/20 1053 02/04/20 1123  BP: 122/63 135/76 136/78 137/73  Pulse: 68 (!) 55 72 63  Resp: 17  18 14   Temp:    98.7 F (37.1 C)  TempSrc:    Oral  SpO2: 99%  98% 99%  Weight:      Height:        General: Pt is alert, awake, not in acute distress Cardiovascular: S1/S2 +, no rubs, no gallops Respiratory: CTA bilaterally, no wheezing, no rhonchi Abdominal: Soft, NT, ND, bowel sounds + Extremities: no cyanosis    The results of significant diagnostics from this hospitalization (including imaging, microbiology, ancillary and laboratory) are listed below for reference.     Microbiology: Recent Results (from the past 240 hour(s))  SARS Coronavirus 2 by RT PCR (hospital order, performed in [facility] hospital lab) Nasopharyngeal Nasopharyngeal Swab     Status: None   Collection Time: 02/03/20  8:19 PM   Specimen: Nasopharyngeal Swab  Result Value Ref Range Status   SARS Coronavirus 2 NEGATIVE NEGATIVE Final    Comment: (NOTE) SARS-CoV-2 target nucleic acids are NOT DETECTED. The SARS-CoV-2 RNA is generally detectable in upper and lower respiratory specimens during the acute phase of infection. The lowest concentration of SARS-CoV-2 viral copies this assay can detect is 250 copies / mL. A negative result does not preclude SARS-CoV-2 infection and should not be used as the sole basis for treatment or other patient management decisions.  A negative result may occur with improper specimen collection / handling, submission of specimen other than nasopharyngeal swab, presence of viral mutation(s) within the areas targeted by this assay, and inadequate number of viral copies (<250 copies / mL). A negative result must be combined  with clinical observations, patient history, and epidemiological information. Fact Sheet for Patients:   StrictlyIdeas.no Fact Sheet for Healthcare Providers: BankingDealers.co.za This test is not yet approved or cleared  by the [place] FDA and has been authorized for detection and/or diagnosis of SARS-CoV-2 by FDA  under an Emergency Use Authorization (EUA).  This EUA will remain in effect (meaning this test can be used) for the duration of the COVID-19 declaration under Section 564(b)(1) of the Act, 21 U.S.C. section 360bbb-3(b)(1), unless the authorization is terminated or revoked sooner. Performed at [facility], [address] [place], [place] [address]      Labs: BNP (last 3 results) No results for input(s): BNP in the last 8760 hours. Basic Metabolic Panel: Recent Labs  Lab 02/03/20 1244 02/04/20 0511  NA 141  --   K 4.8  --   CL 108  --   CO2 23  --   GLUCOSE 176*  --   BUN 28*  --   CREATININE 1.42* 0.88  CALCIUM 9.0  --   MG  --  2.0   Liver Function Tests: No results for input(s): AST, ALT, ALKPHOS, BILITOT, PROT, ALBUMIN in the last 168 hours. No results for input(s): LIPASE, AMYLASE in the last 168 hours. No results for input(s): AMMONIA in the last 168 hours. CBC: Recent Labs  Lab 02/03/20 1244 02/04/20 0511  WBC 9.3 7.7  HGB 11.3* 11.0*  HCT 36.4* 35.0*  MCV 91.2 89.7  PLT 216 222   Cardiac Enzymes: No results for input(s): CKTOTAL, CKMB, CKMBINDEX, TROPONINI in the last 168 hours. BNP: Invalid input(s): POCBNP CBG: Recent Labs  Lab 02/03/20 2122 02/04/20 0812  GLUCAP 130* 125*   D-Dimer No results for input(s): DDIMER in the last 72 hours. Hgb A1c Recent Labs    02/03/20 1824  HGBA1C 6.3*   Lipid Profile No results for input(s): CHOL, HDL, LDLCALC, TRIG, CHOLHDL, LDLDIRECT in the last 72 hours. Thyroid function studies No results for input(s): TSH, T4TOTAL, T3FREE,  THYROIDAB in the last 72 hours.  Invalid input(s): FREET3 Anemia work up No results for input(s): VITAMINB12, FOLATE, FERRITIN, TIBC, IRON, RETICCTPCT in the last 72 hours. Urinalysis No results found for: COLORURINE, APPEARANCEUR, [place], [place], [place], [place], [place], [place], PROTEINUR, UROBILINOGEN, NITRITE, LEUKOCYTESUR Sepsis Labs Invalid input(s): PROCALCITONIN,  WBC,  LACTICIDVEN Microbiology Recent Results (from the past 240 hour(s))  SARS Coronavirus 2 by RT PCR (hospital order, performed in [facility] hospital lab) Nasopharyngeal Nasopharyngeal Swab     Status: None   Collection Time: 02/03/20  8:19 PM   Specimen: Nasopharyngeal Swab  Result Value Ref Range Status   SARS Coronavirus 2 NEGATIVE NEGATIVE Final    Comment: (NOTE) SARS-CoV-2 target nucleic acids are NOT DETECTED. The SARS-CoV-2 RNA is generally detectable in upper and lower respiratory specimens during the acute phase of infection. The lowest concentration of SARS-CoV-2 viral copies this assay can detect is 250 copies / mL. A negative result does not preclude SARS-CoV-2 infection and should not be used as the sole basis for treatment or other patient management decisions.  A negative result may occur with improper specimen collection / handling, submission of specimen other than nasopharyngeal swab, presence of viral mutation(s) within the areas targeted by this assay, and inadequate number of viral copies (<250 copies / mL). A negative result must be combined with clinical observations, patient history, and epidemiological information. Fact Sheet for Patients:   StrictlyIdeas.no Fact Sheet for Healthcare Providers: BankingDealers.co.za This test is not yet approved or cleared  by the [place] FDA and has been authorized for detection and/or diagnosis of SARS-CoV-2 by FDA under an Emergency Use Authorization (EUA).  This EUA will remain in  effect (meaning this test can be used) for the duration of the COVID-19  declaration under Section 564(b)(1) of the Act, 21 U.S.C. section 360bbb-3(b)(1), unless the authorization is terminated or revoked sooner. Performed at [facility], [address], [place], [place] [address]      Time coordinating discharge: Over 30 minutes  SIGNED:   Wyvonnia Dusky, MD  [facility] 02/04/2020, 3:18 PM Pager   If 7PM-7AM, please contact night-coverage [email or web address]

## 2020-02-04 NOTE — Progress Notes (Signed)
*  PRELIMINARY RESULTS* Echocardiogram 2D Echocardiogram has been performed.  Sherrie Sport 02/04/2020, 9:11 AM

## 2020-02-04 NOTE — ED Notes (Signed)
Pt resting quietly. Pt BP has continued to remain WNL during the visit despite no hypertensive meds being given.

## 2020-02-11 ENCOUNTER — Other Ambulatory Visit
Admission: RE | Admit: 2020-02-11 | Discharge: 2020-02-11 | Disposition: A | Discharge status: Home / Self Care | Payer: Commercial Managed Care - PPO | Source: Ambulatory Visit | Attending: Cardiology | Admitting: Cardiology

## 2020-02-11 ENCOUNTER — Other Ambulatory Visit: Payer: Self-pay

## 2020-02-11 DIAGNOSIS — Z01812 Encounter for preprocedural laboratory examination: Secondary | ICD-10-CM | POA: Insufficient documentation

## 2020-02-11 DIAGNOSIS — Z20822 Contact with and (suspected) exposure to covid-19: Secondary | ICD-10-CM | POA: Insufficient documentation

## 2020-02-11 LAB — SARS CORONAVIRUS 2 (TAT 6-24 HRS): SARS Coronavirus 2: NEGATIVE

## 2020-02-15 ENCOUNTER — Encounter
Admission: RE | Disposition: A | Discharge status: Home / Self Care | Payer: Self-pay | Source: Home / Self Care | Attending: Cardiology

## 2020-02-15 ENCOUNTER — Ambulatory Visit
Admission: RE | Admit: 2020-02-15 | Discharge: 2020-02-15 | Disposition: A | Discharge status: Home / Self Care | Payer: Commercial Managed Care - PPO | Attending: Cardiology | Admitting: Cardiology

## 2020-02-15 ENCOUNTER — Other Ambulatory Visit: Payer: Self-pay

## 2020-02-15 ENCOUNTER — Encounter: Payer: Self-pay | Admitting: Cardiology

## 2020-02-15 DIAGNOSIS — K219 Gastro-esophageal reflux disease without esophagitis: Secondary | ICD-10-CM | POA: Insufficient documentation

## 2020-02-15 DIAGNOSIS — I350 Nonrheumatic aortic (valve) stenosis: Secondary | ICD-10-CM | POA: Diagnosis not present

## 2020-02-15 DIAGNOSIS — R0602 Shortness of breath: Secondary | ICD-10-CM

## 2020-02-15 DIAGNOSIS — Z7984 Long term (current) use of oral hypoglycemic drugs: Secondary | ICD-10-CM | POA: Diagnosis not present

## 2020-02-15 DIAGNOSIS — F17210 Nicotine dependence, cigarettes, uncomplicated: Secondary | ICD-10-CM | POA: Insufficient documentation

## 2020-02-15 DIAGNOSIS — E119 Type 2 diabetes mellitus without complications: Secondary | ICD-10-CM | POA: Diagnosis not present

## 2020-02-15 DIAGNOSIS — Z79899 Other long term (current) drug therapy: Secondary | ICD-10-CM | POA: Insufficient documentation

## 2020-02-15 DIAGNOSIS — E785 Hyperlipidemia, unspecified: Secondary | ICD-10-CM | POA: Diagnosis not present

## 2020-02-15 DIAGNOSIS — R079 Chest pain, unspecified: Secondary | ICD-10-CM | POA: Diagnosis present

## 2020-02-15 DIAGNOSIS — E7800 Pure hypercholesterolemia, unspecified: Secondary | ICD-10-CM | POA: Diagnosis not present

## 2020-02-15 DIAGNOSIS — I2510 Atherosclerotic heart disease of native coronary artery without angina pectoris: Secondary | ICD-10-CM | POA: Insufficient documentation

## 2020-02-15 DIAGNOSIS — Y832 Surgical operation with anastomosis, bypass or graft as the cause of abnormal reaction of the patient, or of later complication, without mention of misadventure at the time of the procedure: Secondary | ICD-10-CM | POA: Insufficient documentation

## 2020-02-15 DIAGNOSIS — T82855A Stenosis of coronary artery stent, initial encounter: Secondary | ICD-10-CM | POA: Insufficient documentation

## 2020-02-15 DIAGNOSIS — I10 Essential (primary) hypertension: Secondary | ICD-10-CM | POA: Diagnosis not present

## 2020-02-15 HISTORY — PX: RIGHT/LEFT HEART CATH AND CORONARY ANGIOGRAPHY: CATH118266

## 2020-02-15 LAB — GLUCOSE, CAPILLARY: Glucose-Capillary: 82 mg/dL (ref 70–99)

## 2020-02-15 SURGERY — RIGHT/LEFT HEART CATH AND CORONARY ANGIOGRAPHY
Anesthesia: Moderate Sedation

## 2020-02-15 MED ORDER — SODIUM CHLORIDE 0.9 % WEIGHT BASED INFUSION: 1.0000 mL/kg/h | INTRAVENOUS | Status: DC

## 2020-02-15 MED ORDER — SODIUM CHLORIDE 0.9% FLUSH: 3.0000 mL | Freq: Two times a day (BID) | INTRAVENOUS | Status: DC

## 2020-02-15 MED ORDER — IOHEXOL 300 MG/ML  SOLN
INTRAMUSCULAR | Status: DC | PRN
  Administered 2020-02-15: 105 mL

## 2020-02-15 MED ORDER — SODIUM CHLORIDE 0.9% FLUSH: 3.0000 mL | INTRAVENOUS | Status: DC | PRN

## 2020-02-15 MED ORDER — FENTANYL CITRATE (PF) 100 MCG/2ML IJ SOLN
INTRAMUSCULAR | Status: DC | PRN
  Administered 2020-02-15: 50 ug via INTRAVENOUS
  Administered 2020-02-15: 25 ug via INTRAVENOUS

## 2020-02-15 MED ORDER — HEPARIN (PORCINE) IN NACL 1000-0.9 UT/500ML-% IV SOLN
INTRAVENOUS | Status: AC
  Filled 2020-02-15: qty 1000

## 2020-02-15 MED ORDER — MIDAZOLAM HCL 2 MG/2ML IJ SOLN
INTRAMUSCULAR | Status: AC
  Filled 2020-02-15: qty 2

## 2020-02-15 MED ORDER — ASPIRIN 81 MG PO CHEW: 81.0000 mg | CHEWABLE_TABLET | ORAL | Status: DC

## 2020-02-15 MED ORDER — SODIUM CHLORIDE 0.9 % WEIGHT BASED INFUSION
3.0000 mL/kg/h | INTRAVENOUS | Status: AC
  Administered 2020-02-15: 3 mL/kg/h via INTRAVENOUS

## 2020-02-15 MED ORDER — LIDOCAINE HCL (PF) 1 % IJ SOLN
INTRAMUSCULAR | Status: AC
  Filled 2020-02-15: qty 30

## 2020-02-15 MED ORDER — MIDAZOLAM HCL 2 MG/2ML IJ SOLN
INTRAMUSCULAR | Status: DC | PRN
  Administered 2020-02-15: 0.5 mg via INTRAVENOUS
  Administered 2020-02-15: 1 mg via INTRAVENOUS

## 2020-02-15 MED ORDER — SODIUM CHLORIDE 0.9 % IV SOLN: 250.0000 mL | INTRAVENOUS | Status: DC | PRN

## 2020-02-15 MED ORDER — HEPARIN (PORCINE) IN NACL 1000-0.9 UT/500ML-% IV SOLN
INTRAVENOUS | Status: DC | PRN
  Administered 2020-02-15: 500 mL

## 2020-02-15 MED ORDER — FENTANYL CITRATE (PF) 100 MCG/2ML IJ SOLN
INTRAMUSCULAR | Status: AC
  Filled 2020-02-15: qty 2

## 2020-02-15 SURGICAL SUPPLY — 14 items
CATH INFINITI 5FR ANG PIGTAIL (CATHETERS) ×3 IMPLANT
CATH INFINITI 5FR JL4 (CATHETERS) ×3 IMPLANT
CATH INFINITI JR4 5F (CATHETERS) ×3 IMPLANT
CATH SWANZ 7F THERMO (CATHETERS) ×3 IMPLANT
DEVICE CLOSURE MYNXGRIP 6/7F (Vascular Products) ×3 IMPLANT
KIT MANI 3VAL PERCEP (MISCELLANEOUS) ×3 IMPLANT
KIT RIGHT HEART (MISCELLANEOUS) ×3 IMPLANT
NEEDLE PERC 18GX7CM (NEEDLE) ×3 IMPLANT
PACK CARDIAC CATH (CUSTOM PROCEDURE TRAY) ×3 IMPLANT
SHEATH AVANTI 6FR X 11CM (SHEATH) ×3 IMPLANT
SHEATH AVANTI 7FRX11 (SHEATH) ×3 IMPLANT
WIRE EMERALD 3MM-J .035X260CM (WIRE) ×3 IMPLANT
WIRE EMERALD ST .035X150CM (WIRE) ×3 IMPLANT
WIRE GUIDERIGHT .035X150 (WIRE) ×3 IMPLANT

## 2020-02-15 NOTE — Progress Notes (Signed)
Dr. Saralyn Pilar at bedside, speaking with pt. And his wife Curtis Maxwell re: cath procedure results. Both verbalize understanding of conversation.

## 2020-02-22 ENCOUNTER — Encounter: Payer: Commercial Managed Care - PPO | Admitting: Cardiothoracic Surgery

## 2020-02-25 ENCOUNTER — Other Ambulatory Visit: Payer: Self-pay

## 2020-02-25 ENCOUNTER — Encounter: Payer: Self-pay | Admitting: Cardiothoracic Surgery

## 2020-02-25 ENCOUNTER — Institutional Professional Consult (permissible substitution): Payer: Commercial Managed Care - PPO | Admitting: Cardiothoracic Surgery

## 2020-02-25 DIAGNOSIS — I2510 Atherosclerotic heart disease of native coronary artery without angina pectoris: Secondary | ICD-10-CM

## 2020-02-25 DIAGNOSIS — R55 Syncope and collapse: Secondary | ICD-10-CM | POA: Insufficient documentation

## 2020-02-25 DIAGNOSIS — I350 Nonrheumatic aortic (valve) stenosis: Secondary | ICD-10-CM

## 2020-02-25 NOTE — Progress Notes (Signed)
PCP is [facility] [place] Referring Provider is Isaias Cowman, MD  Chief Complaint  Patient presents with  . Coronary Artery Disease    Surgical consultation    HPI: Patient examined, images of coronary angiogram and 2D echocardiogram personally reviewed and discussed with patient and wife.  59 year old employed married male with type 2 diabetes and history of PCI 10 years ago presents after recent diagnosis of severe aortic stenosis with multivessel coronary artery disease.  He works at a [employer] and has had 2 episodes of dizziness with near syncope and 1 episode of dizziness with full syncopal episode.  An echocardiogram showed severe aortic stenosis with valve area of less than 0.7 and mean gradient of 45.  Echo shows LVH with good LV systolic function and no other significant valvular disease.  Coronary arteriography shows patent LAD with high-grade 90% proximal circumflex stenosis, 80% stenosis at the takeoff of the OM1, and chronic occlusion of the RCA with filling via collaterals.  LVEDP was 14 and EF normal.  Patient is referred for aortic valve replacement with combined coronary bypass grafting.  States he has had a cardiac murmur for most of his life.  He denies family history of cardiac valve disease.  He denies personal history of rheumatic or scarlet fever as an adolescent.  He is right-hand dominant and denies any active dental complaints or chronic dental disease  He has been healthy and has worked continuously without hospitalization until being admitted thru the ED after his syncopal event.   Past Medical History:  Diagnosis Date  . Coronary artery disease   . High cholesterol   . Hypertension   . Myocardial infarct, old   . Plantar fasciitis     Past Surgical History:  Procedure Laterality Date  . CARDIAC SURGERY    . CORONARY ANGIOPLASTY WITH STENT PLACEMENT    . RIGHT/LEFT HEART CATH AND CORONARY ANGIOGRAPHY N/A 12/31/2017   Procedure: RIGHT/LEFT  HEART CATH AND CORONARY ANGIOGRAPHY;  Surgeon: Isaias Cowman, MD;  Location: [facility];  Service: Cardiovascular;  Laterality: N/A;  . RIGHT/LEFT HEART CATH AND CORONARY ANGIOGRAPHY N/A 02/15/2020   Procedure: RIGHT/LEFT HEART CATH AND CORONARY ANGIOGRAPHY;  Surgeon: Isaias Cowman, MD;  Location: [facility];  Service: Cardiovascular;  Laterality: N/A;    Family History  Problem Relation Age of Onset  . Gout Brother     Social History Social History   Tobacco Use  . Smoking status: Former Smoker    Types: E-cigarettes  . Smokeless tobacco: Never Used  Vaping Use  . Vaping Use: Former  Substance Use Topics  . Alcohol use: Yes    Alcohol/week: 10.0 standard drinks    Types: 10 Cans of beer per week  . Drug use: Never    Current Outpatient Medications  Medication Sig Dispense Refill  . amLODipine (NORVASC) 10 MG tablet Take 10 mg by mouth daily.    Marland Kitchen aspirin 81 MG tablet Take 81 mg by mouth daily.    . Cholecalciferol 50 MCG (2000 UT) CAPS Take by mouth daily.     . cyanocobalamin 1000 MCG tablet Take 1,000 mcg by mouth daily.     . hydrochlorothiazide (HYDRODIURIL) 25 MG tablet Take 25 mg by mouth daily.    Marland Kitchen lisinopril (PRINIVIL,ZESTRIL) 40 MG tablet Take 40 mg by mouth daily.     . metFORMIN (GLUCOPHAGE) 500 MG tablet Take by mouth.    . metoprolol succinate (TOPROL-XL) 25 MG 24 hr tablet Take 25 mg  by mouth daily.    . Omega-3 Fatty Acids (FISH OIL) 1200 MG CAPS Take by mouth.    Marland Kitchen omeprazole (PRILOSEC) 20 MG capsule Take 20-40 mg by mouth daily as needed.     . prasugrel (EFFIENT) 10 MG TABS tablet Take 10 mg by mouth daily.    . sildenafil (REVATIO) 20 MG tablet SMARTSIG:3 Tablet(s) By Mouth PRN    . simvastatin (ZOCOR) 20 MG tablet Take 20 mg by mouth daily.      No current facility-administered medications for this visit.    No Known Allergies  Review of Systems       Right-hand-dominant History right clavicle fracture as a  adolescent Remote history of smoking Drinks beer 2 to 3/day No history of DVT claudication varicose veins             Review of Systems :  [ y ] = yes, [  ] = no        General :  Weight gain [   ]    Weight loss  [   ]  Fatigue [  ]  Fever [  ]  Chills  [  ]                                          HEENT    Headache [  ]  Dizziness Blue.Reese ]  Blurred vision [  ] Glaucoma  [  ]                          Nosebleeds [  ] Painful or loose teeth [  ]        Cardiac :  Chest pain/ pressure [  ]  Resting SOB [  ] exertional SOB [  ]                        Orthopnea [  ]  Pedal edema  [  ]  Palpitations [  ] Syncope/presyncope Kaito.Grams ]                        Paroxysmal nocturnal dyspnea [  ]         Pulmonary : cough [  ]  wheezing [  ]  Hemoptysis [  ] Sputum [  ] Snoring [  ]                              Pneumothorax [  ]  Sleep apnea [  ]        GI : Vomiting [  ]  Dysphagia [  ]  Melena  [  ]  Abdominal pain [  ] BRBPR [  ]              Heart burn [  ]  Constipation [  ] Diarrhea  [  ] Colonoscopy [   ]        GU : Hematuria [  ]  Dysuria [  ]  Nocturia [  ] UTI's [  ]        Vascular : Claudication [  ]  Rest pain [  ]  DVT [  ] Vein stripping [  ] leg ulcers [  ]  TIA [  ] Stroke [  ]  Varicose veins [  ]        NEURO :  Headaches  [  ] Seizures [  ] Vision changes [  ] Paresthesias [  ]                                               Musculoskeletal :  Arthritis [  ] Gout  [  ]  Back pain [  ]  Joint pain [  ]        Skin :  Rash [  ]  Melanoma [  ] Sores [  ]        Heme : Bleeding problems [  ]Clotting Disorders [  ] Anemia [  ]Blood Transfusion [ ]         Endocrine : Diabetes Blue.Reese  ] Heat or Cold intolerance [  ] Polyuria [  ]excessive thirst [ ]         Psych : Depression [  ]  Anxiety [  ]  Psych hospitalizations [  ] Memory change [  ]                                                                            BP 122/74 (BP Location: Left Arm, Patient  Position: Sitting, Cuff Size: Normal)   Pulse 65   Temp (!) 97.5 F (36.4 C) (Temporal)   Resp 20   Ht 5' 6.5" (1.689 m)   Wt 192 lb (87.1 kg)   SpO2 98% Comment: RA  BMI 30.53 kg/m  Physical Exam      Physical Exam  General: Very sharp middle-aged male accompanied by his wife no acute distress HEENT: Normocephalic pupils equal , dentition adequate Neck: Supple without JVD, adenopathy, or bruit Chest: Clear to auscultation, symmetrical breath sounds, no rhonchi, no tenderness             or deformity Cardiovascular: Regular rate and rhythm, 3/6 systolic ejection murmur of AS without  gallop, peripheral pulses             palpable in all extremities Abdomen:  Soft, nontender, no palpable mass or organomegaly Extremities: Warm, well-perfused, no clubbing cyanosis edema or tenderness,              no venous stasis changes of the legs Rectal/GU: Deferred Neuro: Grossly non--focal and symmetrical throughout Skin: Clean and dry without rash or ulceration   Diagnostic Tests: Images of coronary angiogram and echocardiogram personally reviewed as noted above  Impression: Severe aortic stenosis with syncope Severe multivessel coronary disease with chronic RCA occlusion and 90% ostial circumflex stenosis Preserved LV systolic function Hypertension Type 2 diabetes  Plan: Patient would benefit from aortic valve replacement and combined CABG. We discussed the types of valves available and both agree that a bioprosthetic valve would be his best long-term option. Plan bypass graft to the OM1, distal circumflex, and posterior descending. Patient understands the benefits and risks of surgery as well as the expected recovery and his inability to return to his  physical job for approximately 3 months.  Surgery scheduled at [facility] on July 2 with preoperative visit to be arranged 2 to 3 days before surgery   Len Childs, MD [facility] [phone]

## 2020-02-29 ENCOUNTER — Other Ambulatory Visit (HOSPITAL_COMMUNITY): Payer: Commercial Managed Care - PPO

## 2020-03-01 NOTE — Progress Notes (Addendum)
[place] ([place]), [place] [address] [place] ([place]) [place] [address] Phone: [phone] Fax: [phone]    Your procedure is scheduled on Friday, July 2nd.  Report to [facility] Main Entrance "A" at 5:30 A.M., and check in at the Admitting office.  Call this number if you have problems the morning of surgery:  [phone]  Call [phone] if you have any questions prior to your surgery date Monday-Friday 8am-4pm   Remember:  Do not eat or drink after midnight the night before your surgery    Take these medicines the morning of surgery with A SIP OF WATER  amLODipine (NORVASC)  metoprolol succinate (TOPROL-XL)  If needed - omeprazole (PRILOSEC)   Follow your surgeon's instructions on when to stop Aspirin and prasugrel (EFFIENT).  If no instructions were given by your surgeon then you will need to call the office to get those instructions.    As of today, STOP taking Aspirin containing products, Aleve, Naproxen, Ibuprofen, Motrin, Advil, Goody's, BC's, all herbal medications, fish oil, and all vitamins.  WHAT DO I DO ABOUT MY DIABETES MEDICATION?.  -The morning of surgery  Do NOT take metFORMIN (GLUCOPHAGE)   HOW TO MANAGE YOUR DIABETES BEFORE AND AFTER SURGERY  Why is it important to control my blood sugar before and after surgery? . Improving blood sugar levels before and after surgery helps healing and can limit problems. . A way of improving blood sugar control is eating a healthy diet by: o  Eating less sugar and carbohydrates o  Increasing activity/exercise o  Talking with your doctor about reaching your blood sugar goals . High blood sugars (greater than 180 mg/dL) can raise your risk of infections and slow your recovery, so you will need to focus on controlling your diabetes during the weeks before surgery. . Make sure that the doctor who takes care of your diabetes knows about your planned surgery  including the date and location.  How do I manage my blood sugar before surgery? . Check your blood sugar at least 4 times a day, starting 2 days before surgery, to make sure that the level is not too high or low. . Check your blood sugar the morning of your surgery when you wake up and every 2 hours until you get to the Short Stay unit. o If your blood sugar is less than 70 mg/dL, you will need to treat for low blood sugar: - Do not take insulin. - Treat a low blood sugar (less than 70 mg/dL) with  cup of clear juice (cranberry or apple), 4 glucose tablets, OR glucose gel. - Recheck blood sugar in 15 minutes after treatment (to make sure it is greater than 70 mg/dL). If your blood sugar is not greater than 70 mg/dL on recheck, call [phone] for further instructions. . Report your blood sugar to the short stay nurse when you get to Short Stay.  . If you are admitted to the hospital after surgery: o Your blood sugar will be checked by the staff and you will probably be given insulin after surgery (instead of oral diabetes medicines) to make sure you have good blood sugar levels. o The goal for blood sugar control after surgery is 80-180 mg/dL.             Do not wear jewelry.            Do not wear lotions, powders,colognes, or deodorant.  Men may shave face and neck.            Do not bring valuables to the hospital.            [facility] is not responsible for any belongings or valuables.  Do NOT Smoke (Tobacco/Vapping) or drink Alcohol 24 hours prior to your procedure If you use a CPAP at night, you may bring all equipment for your overnight stay.   Contacts, glasses, dentures or bridgework may not be worn into surgery.      For patients admitted to the hospital, discharge time will be determined by your treatment team.   Patients discharged the day of surgery will not be allowed to drive home, and someone needs to stay with them for 24 hours.  Special instructions:    [facility]- Preparing For Surgery  Before surgery, you can play an important role. Because skin is not sterile, your skin needs to be as free of germs as possible. You can reduce the number of germs on your skin by washing with CHG (chlorahexidine gluconate) Soap before surgery.  CHG is an antiseptic cleaner which kills germs and bonds with the skin to continue killing germs even after washing.    Oral Hygiene is also important to reduce your risk of infection.  Remember - BRUSH YOUR TEETH THE MORNING OF SURGERY WITH YOUR REGULAR TOOTHPASTE  Please do not use if you have an allergy to CHG or antibacterial soaps. If your skin becomes reddened/irritated stop using the CHG.  Do not shave (including legs and underarms) for at least 48 hours prior to first CHG shower. It is OK to shave your face.  Please follow these instructions carefully.   1. Shower the NIGHT BEFORE SURGERY and the MORNING OF SURGERY with CHG Soap.   2. If you chose to wash your hair, wash your hair first as usual with your normal shampoo.  3. After you shampoo, rinse your hair and body thoroughly to remove the shampoo.  4. Use CHG as you would any other liquid soap. You can apply CHG directly to the skin and wash gently with a scrungie or a clean washcloth.   5. Apply the CHG Soap to your body ONLY FROM THE NECK DOWN.  Do not use on open wounds or open sores. Avoid contact with your eyes, ears, mouth and genitals (private parts). Wash Face and genitals (private parts)  with your normal soap.   6. Wash thoroughly, paying special attention to the area where your surgery will be performed.  7. Thoroughly rinse your body with warm water from the neck down.  8. DO NOT shower/wash with your normal soap after using and rinsing off the CHG Soap.  9. Pat yourself dry with a CLEAN TOWEL.  10. Wear CLEAN PAJAMAS to bed the night before surgery, wear comfortable clothes the morning of surgery  11. Place CLEAN SHEETS on your bed  the night of your first shower and DO NOT SLEEP WITH PETS.  Day of Surgery: Shower with CHG soap as instructed above.  Do not apply any deodorants/lotions.  Please wear clean clothes to the hospital/surgery center.   Remember to brush your teeth WITH YOUR REGULAR TOOTHPASTE.   Please read over the following fact sheets that you were given.

## 2020-03-02 ENCOUNTER — Other Ambulatory Visit (HOSPITAL_COMMUNITY)
Admission: RE | Admit: 2020-03-02 | Discharge: 2020-03-02 | Disposition: A | Discharge status: Home / Self Care | Payer: Commercial Managed Care - PPO | Source: Ambulatory Visit | Attending: Cardiothoracic Surgery | Admitting: Cardiothoracic Surgery

## 2020-03-02 ENCOUNTER — Encounter (HOSPITAL_COMMUNITY)
Admission: RE | Admit: 2020-03-02 | Discharge: 2020-03-02 | Disposition: A | Discharge status: Home / Self Care | Payer: Commercial Managed Care - PPO | Source: Ambulatory Visit | Attending: Cardiothoracic Surgery | Admitting: Cardiothoracic Surgery

## 2020-03-02 ENCOUNTER — Other Ambulatory Visit: Payer: Self-pay

## 2020-03-02 ENCOUNTER — Encounter (HOSPITAL_COMMUNITY): Payer: Self-pay

## 2020-03-02 ENCOUNTER — Other Ambulatory Visit (HOSPITAL_COMMUNITY): Payer: Self-pay | Admitting: Internal Medicine

## 2020-03-02 ENCOUNTER — Ambulatory Visit (HOSPITAL_BASED_OUTPATIENT_CLINIC_OR_DEPARTMENT_OTHER)
Admission: RE | Admit: 2020-03-02 | Discharge: 2020-03-02 | Disposition: A | Discharge status: Home / Self Care | Payer: Commercial Managed Care - PPO | Source: Ambulatory Visit | Attending: Cardiothoracic Surgery | Admitting: Cardiothoracic Surgery

## 2020-03-02 ENCOUNTER — Ambulatory Visit (HOSPITAL_COMMUNITY)
Admission: RE | Admit: 2020-03-02 | Discharge: 2020-03-02 | Disposition: A | Discharge status: Home / Self Care | Payer: Commercial Managed Care - PPO | Source: Ambulatory Visit | Attending: Cardiothoracic Surgery | Admitting: Cardiothoracic Surgery

## 2020-03-02 ENCOUNTER — Other Ambulatory Visit (HOSPITAL_COMMUNITY): Payer: Commercial Managed Care - PPO

## 2020-03-02 DIAGNOSIS — I350 Nonrheumatic aortic (valve) stenosis: Secondary | ICD-10-CM

## 2020-03-02 DIAGNOSIS — I2510 Atherosclerotic heart disease of native coronary artery without angina pectoris: Secondary | ICD-10-CM

## 2020-03-02 DIAGNOSIS — Z20822 Contact with and (suspected) exposure to covid-19: Secondary | ICD-10-CM | POA: Insufficient documentation

## 2020-03-02 DIAGNOSIS — R079 Chest pain, unspecified: Secondary | ICD-10-CM

## 2020-03-02 DIAGNOSIS — Z01818 Encounter for other preprocedural examination: Secondary | ICD-10-CM | POA: Insufficient documentation

## 2020-03-02 LAB — BLOOD GAS, ARTERIAL
Acid-base deficit: 3.7 mmol/L — ABNORMAL HIGH (ref 0.0–2.0)
Bicarbonate: 20.6 mmol/L (ref 20.0–28.0)
Drawn by: 58793
FIO2: 21
O2 Saturation: 95.6 %
Patient temperature: 37
pCO2 arterial: 35.9 mmHg (ref 32.0–48.0)
pH, Arterial: 7.376 (ref 7.350–7.450)
pO2, Arterial: 88.2 mmHg (ref 83.0–108.0)

## 2020-03-02 LAB — PULMONARY FUNCTION TEST
DL/VA % pred: 102 %
DL/VA: 4.45 ml/min/mmHg/L
DLCO cor % pred: 79 %
DLCO cor: 20.09 ml/min/mmHg
DLCO unc % pred: 75 %
DLCO unc: 18.99 ml/min/mmHg
FEF 25-75 Post: 3.12 L/sec
FEF 25-75 Pre: 1.76 L/sec
FEF2575-%Change-Post: 77 %
FEF2575-%Pred-Post: 114 %
FEF2575-%Pred-Pre: 64 %
FEV1-%Change-Post: 14 %
FEV1-%Pred-Post: 81 %
FEV1-%Pred-Pre: 71 %
FEV1-Post: 2.28 L
FEV1-Pre: 2 L
FEV1FVC-%Change-Post: 2 %
FEV1FVC-%Pred-Pre: 100 %
FEV6-%Change-Post: 11 %
FEV6-%Pred-Post: 82 %
FEV6-%Pred-Pre: 73 %
FEV6-Post: 2.82 L
FEV6-Pre: 2.52 L
FEV6FVC-%Change-Post: 0 %
FEV6FVC-%Pred-Post: 103 %
FEV6FVC-%Pred-Pre: 103 %
FVC-%Change-Post: 11 %
FVC-%Pred-Post: 79 %
FVC-%Pred-Pre: 71 %
FVC-Post: 2.82 L
FVC-Pre: 2.53 L
Post FEV1/FVC ratio: 81 %
Post FEV6/FVC ratio: 100 %
Pre FEV1/FVC ratio: 79 %
Pre FEV6/FVC Ratio: 100 %
RV % pred: 84 %
RV: 1.69 L
TLC % pred: 74 %
TLC: 4.69 L

## 2020-03-02 LAB — HEMOGLOBIN A1C
Hgb A1c MFr Bld: 6.1 % — ABNORMAL HIGH (ref 4.8–5.6)
Mean Plasma Glucose: 128.37 mg/dL

## 2020-03-02 LAB — URINALYSIS, ROUTINE W REFLEX MICROSCOPIC
Bilirubin Urine: NEGATIVE
Glucose, UA: NEGATIVE mg/dL
Hgb urine dipstick: NEGATIVE
Ketones, ur: NEGATIVE mg/dL
Leukocytes,Ua: NEGATIVE
Nitrite: NEGATIVE
Protein, ur: NEGATIVE mg/dL
Specific Gravity, Urine: 1.012 (ref 1.005–1.030)
pH: 5 (ref 5.0–8.0)

## 2020-03-02 LAB — COMPREHENSIVE METABOLIC PANEL
ALT: 35 U/L (ref 0–44)
AST: 28 U/L (ref 15–41)
Albumin: 4.3 g/dL (ref 3.5–5.0)
Alkaline Phosphatase: 56 U/L (ref 38–126)
Anion gap: 11 (ref 5–15)
BUN: 17 mg/dL (ref 6–20)
CO2: 20 mmol/L — ABNORMAL LOW (ref 22–32)
Calcium: 9.5 mg/dL (ref 8.9–10.3)
Chloride: 106 mmol/L (ref 98–111)
Creatinine, Ser: 0.83 mg/dL (ref 0.61–1.24)
GFR calc Af Amer: >60 mL/min (ref >60)
GFR calc non Af Amer: >60 mL/min (ref >60)
Glucose, Bld: 85 mg/dL (ref 70–99)
Potassium: 4.4 mmol/L (ref 3.5–5.1)
Sodium: 137 mmol/L (ref 135–145)
Total Bilirubin: 0.5 mg/dL (ref 0.3–1.2)
Total Protein: 7.5 g/dL (ref 6.5–8.1)

## 2020-03-02 LAB — GLUCOSE, CAPILLARY
Glucose-Capillary: 65 mg/dL — ABNORMAL LOW (ref 70–99)
Glucose-Capillary: 76 mg/dL (ref 70–99)

## 2020-03-02 LAB — CBC
HCT: 40.2 % (ref 39.0–52.0)
Hemoglobin: 12.8 g/dL — ABNORMAL LOW (ref 13.0–17.0)
MCH: 28.8 pg (ref 26.0–34.0)
MCHC: 31.8 g/dL (ref 30.0–36.0)
MCV: 90.5 fL (ref 80.0–100.0)
Platelets: 252 10*3/uL (ref 150–400)
RBC: 4.44 MIL/uL (ref 4.22–5.81)
RDW: 13.1 % (ref 11.5–15.5)
WBC: 8.8 10*3/uL (ref 4.0–10.5)
nRBC: 0 % (ref 0.0–0.2)

## 2020-03-02 LAB — APTT: aPTT: 31 seconds (ref 24–36)

## 2020-03-02 LAB — SURGICAL PCR SCREEN
MRSA, PCR: NEGATIVE
Staphylococcus aureus: NEGATIVE

## 2020-03-02 LAB — PROTIME-INR
INR: 1 (ref 0.8–1.2)
Prothrombin Time: 13.1 seconds (ref 11.4–15.2)

## 2020-03-02 LAB — SARS CORONAVIRUS 2 (TAT 6-24 HRS): SARS Coronavirus 2: NEGATIVE

## 2020-03-02 LAB — ABO/RH: ABO/RH(D): O POS

## 2020-03-02 MED ORDER — NOREPINEPHRINE 4 MG/250ML-% IV SOLN
0.0000 ug/min | INTRAVENOUS | Status: AC
  Administered 2020-03-03: 2 ug/min via INTRAVENOUS
  Filled 2020-03-02: qty 250

## 2020-03-02 MED ORDER — VANCOMYCIN HCL 1500 MG/300ML IV SOLN
1500.0000 mg | INTRAVENOUS | Status: AC
  Administered 2020-03-03: 1500 mg via INTRAVENOUS
  Filled 2020-03-02: qty 300

## 2020-03-02 MED ORDER — MAGNESIUM SULFATE 50 % IJ SOLN
40.0000 meq | INTRAMUSCULAR | Status: DC
  Filled 2020-03-02: qty 9.85

## 2020-03-02 MED ORDER — PLASMA-LYTE 148 IV SOLN
INTRAVENOUS | Status: DC
  Filled 2020-03-02: qty 2.5

## 2020-03-02 MED ORDER — TRANEXAMIC ACID (OHS) PUMP PRIME SOLUTION
2.0000 mg/kg | INTRAVENOUS | Status: DC
  Filled 2020-03-02: qty 1.77

## 2020-03-02 MED ORDER — POTASSIUM CHLORIDE 2 MEQ/ML IV SOLN
80.0000 meq | INTRAVENOUS | Status: DC
  Filled 2020-03-02: qty 40

## 2020-03-02 MED ORDER — INSULIN REGULAR(HUMAN) IN NACL 100-0.9 UT/100ML-% IV SOLN
INTRAVENOUS | Status: AC
  Administered 2020-03-03: 1 [IU]/h via INTRAVENOUS
  Filled 2020-03-02: qty 100

## 2020-03-02 MED ORDER — PHENYLEPHRINE HCL-NACL 20-0.9 MG/250ML-% IV SOLN
30.0000 ug/min | INTRAVENOUS | Status: AC
  Administered 2020-03-03: 20 ug/min via INTRAVENOUS
  Filled 2020-03-02: qty 250

## 2020-03-02 MED ORDER — ALBUTEROL SULFATE (2.5 MG/3ML) 0.083% IN NEBU
2.5000 mg | INHALATION_SOLUTION | Freq: Once | RESPIRATORY_TRACT | Status: AC
  Administered 2020-03-02: 2.5 mg via RESPIRATORY_TRACT

## 2020-03-02 MED ORDER — SODIUM CHLORIDE 0.9 % IV SOLN
1.5000 g | INTRAVENOUS | Status: AC
  Administered 2020-03-03: 1.5 g via INTRAVENOUS
  Administered 2020-03-03: .75 g via INTRAVENOUS
  Filled 2020-03-02 (×2): qty 1.5

## 2020-03-02 MED ORDER — DEXMEDETOMIDINE HCL IN NACL 400 MCG/100ML IV SOLN
0.1000 ug/kg/h | INTRAVENOUS | Status: AC
  Administered 2020-03-03: .4 ug/kg/h via INTRAVENOUS
  Filled 2020-03-02: qty 100

## 2020-03-02 MED ORDER — TRANEXAMIC ACID (OHS) BOLUS VIA INFUSION
15.0000 mg/kg | INTRAVENOUS | Status: AC
  Administered 2020-03-03: 1327.5 mg via INTRAVENOUS
  Filled 2020-03-02: qty 1328

## 2020-03-02 MED ORDER — MILRINONE LACTATE IN DEXTROSE 20-5 MG/100ML-% IV SOLN
0.3000 ug/kg/min | INTRAVENOUS | Status: AC
  Administered 2020-03-03: .25 ug/kg/min via INTRAVENOUS
  Filled 2020-03-02: qty 100

## 2020-03-02 MED ORDER — SODIUM CHLORIDE 0.9 % IV SOLN
INTRAVENOUS | Status: DC
  Filled 2020-03-02: qty 30

## 2020-03-02 MED ORDER — NITROGLYCERIN IN D5W 200-5 MCG/ML-% IV SOLN
2.0000 ug/min | INTRAVENOUS | Status: DC
  Filled 2020-03-02: qty 250

## 2020-03-02 MED ORDER — EPINEPHRINE HCL 5 MG/250ML IV SOLN IN NS
0.0000 ug/min | INTRAVENOUS | Status: DC
  Filled 2020-03-02: qty 250

## 2020-03-02 MED ORDER — TRANEXAMIC ACID 1000 MG/10ML IV SOLN
1.5000 mg/kg/h | INTRAVENOUS | Status: AC
  Administered 2020-03-03: 1.5 mg/kg/h via INTRAVENOUS
  Filled 2020-03-02: qty 25

## 2020-03-02 MED ORDER — SODIUM CHLORIDE 0.9 % IV SOLN
750.0000 mg | INTRAVENOUS | Status: DC
  Filled 2020-03-02 (×2): qty 750

## 2020-03-02 NOTE — Progress Notes (Signed)
PCP - Toni Arthurs, MD [facility]) Cardiologist - Dr. Josefa Half    Chest x-ray - 03/02/20 EKG - 02/03/20 Stress Test - requested from Dr. Saralyn Pilar ECHO - 02/04/20 Cardiac Cath - 02/15/20   Fasting Blood Sugar - (in PAT pt claims to not have had any food this morning - CBG 65 - soda and juice given, Jeneen Rinks, PA called and aware, CBG will be rechecked before pt leaves PAT, Jeneen Rinks, PA will be updated)  Per pt, he has not taken Metformin in a few days, he does not ever check his sugars at home. Pt instructed to contact PCP for further instructions on diabetic medication regimen.   Levonne Spiller made aware of the low CBG in PAT and instructions given to pt to contact PCP about diabetes and medication regimen.   Blood Thinner Instructions: Aspirin Instructions: Follow your surgeon's instructions on when to stop Aspirin and prasugrel (EFFIENT).  If no instructions were given by your surgeon then you will need to call the office to get those instructions.  Per pt, last dose Effient 02/26/20. Instructions by Levonne Spiller - no ASA DOS.   ERAS Protcol - n/a   COVID TEST- 03/02/20 after pre CABG studies. Rolla Flatten, RN made appt for pt @ 1300. Pt instructed to self quarantine at home until surgery tomorrow. Pt verbalized understanding.    Coronavirus Screening  Have you experienced the following symptoms:  Cough yes/no: No Fever (>100.11F)  yes/no: No Runny nose yes/no: No Sore throat yes/no: No Difficulty breathing/shortness of breath  yes/no: No  Have you or a family member traveled in the last 14 days and where? yes/no: No   If the patient indicates "YES" to the above questions, their PAT will be rescheduled to limit the exposure to others and, the surgeon will be notified. THE PATIENT WILL NEED TO BE ASYMPTOMATIC FOR 14 DAYS.   If the patient is not experiencing any of these symptoms, the PAT nurse will instruct them to NOT bring anyone with them to their appointment since they may have these  symptoms or traveled as well.   Please remind your patients and families that hospital visitation restrictions are in effect and the importance of the restrictions.     Anesthesia review: yes - documents for stress test requested from Dr. Saralyn Pilar, cardiac hx.  Patient denies shortness of breath, fever, cough and chest pain at PAT appointment   All instructions explained to the patient, with a verbal understanding of the material. Patient agrees to go over the instructions while at home for a better understanding. Patient also instructed to self quarantine after being tested for COVID-19. The opportunity to ask questions was provided.

## 2020-03-02 NOTE — Anesthesia Preprocedure Evaluation (Addendum)
Anesthesia Evaluation  Patient identified by MRN, date of birth, ID band Patient awake    Reviewed: Allergy & Precautions, NPO status , Patient's Chart, lab work & pertinent test results, reviewed documented beta blocker date and time   Airway Mallampati: III  TM Distance: >3 FB Neck ROM: Full    Dental  (+) Dental Advisory Given, Teeth Intact   Pulmonary former smoker,    Pulmonary exam normal breath sounds clear to auscultation       Cardiovascular hypertension, Pt. on home beta blockers and Pt. on medications + angina + CAD and + Past MI  + Valvular Problems/Murmurs  Rhythm:Regular Rate:Normal + Systolic murmurs 1. Left ventricular ejection fraction, by estimation, is 65 to 70%. The left ventricle has normal function. The left ventricle has no regional wall motion abnormalities. There is moderate left ventricular hypertrophy. Left ventricular diastolic parameters are consistent with Grade I diastolic dysfunction (impaired relaxation).  2. Right ventricular systolic function is normal. The right ventricular size is mildly enlarged. There is normal pulmonary artery systolic pressure.  3. Left atrial size was mildly dilated.  4. The mitral valve is grossly normal. Mild mitral valve regurgitation.  5. The aortic valve is abnormal. Aortic valve regurgitation is mild. Severe aortic valve stenosis. Aortic valve area, by VTI measures 0.52 cm. Aortic valve mean gradient measures 41.6 mmHg. Aortic valve Vmax measures  4.18 m/s.    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  The artery is widely patent and dominant. Normal left main. Circumflex coronary artery is widely patent but there is ostial 50% narrowing in the first obtuse marginal. Eccentric 50% stenosis in proximal to mid LAD after the large first diagonal.  Because of the eccentricity and current clinical presentation, FFR  was performed and demonstrated that the lesion was not hemodynamically significant with a value of 0.89.  Normal LV function.  Normal LVEDP.  Estimated EF 55%.  RECOMMENDATIONS:   Aggressive risk factor modification: High intensity statin therapy to reduce LDL less than 70; blood pressure less than 130/80 mmHg; smoking cessation; moderate intensity aerobic activity greater than or equal to 150 minutes/week; and, close monitoring of glycemic control. Seems it would be safe to discontinue long-acting nitrates since they are not helping and causing headache of significance. Consider adding Plavix 75 mg/day given family history and other risk factors, until risk preventative measures are fully cooperative.     Neuro/Psych negative neurological ROS     GI/Hepatic Neg liver ROS, GERD  ,  Endo/Other  diabetes, Oral Hypoglycemic Agents  Renal/GU negative Renal ROS     Musculoskeletal  (+) Arthritis ,   Abdominal   Peds  Hematology negative hematology ROS (+)   Anesthesia Other Findings   Reproductive/Obstetrics                         Anesthesia Physical Anesthesia Plan  ASA: IV  Anesthesia Plan: General   Post-op Pain Management:    Induction: Intravenous  PONV Risk Score and Plan: 3 and Treatment may vary due to age or medical condition and Midazolam  Airway Management Planned: Oral ETT  Additional Equipment: Arterial line, CVP, PA Cath, TEE, 3D TEE and Ultrasound Guidance Line Placement  Intra-op Plan: Utilization Of Total Body Hypothermia per surgeon request  Post-operative Plan: Post-operative intubation/ventilation  Informed Consent: I have reviewed the patients History and Physical, chart, labs and discussed the procedure including the  risks, benefits and alternatives for the proposed anesthesia with the patient or authorized representative who has indicated his/her understanding and acceptance.     Dental advisory given  Plan  Discussed with: CRNA  Anesthesia Plan Comments:        Anesthesia Quick Evaluation

## 2020-03-03 ENCOUNTER — Inpatient Hospital Stay (HOSPITAL_COMMUNITY): Payer: Commercial Managed Care - PPO

## 2020-03-03 ENCOUNTER — Encounter (HOSPITAL_COMMUNITY): Payer: Self-pay | Admitting: Cardiothoracic Surgery

## 2020-03-03 ENCOUNTER — Inpatient Hospital Stay (HOSPITAL_COMMUNITY): Payer: Commercial Managed Care - PPO | Admitting: Physician Assistant

## 2020-03-03 ENCOUNTER — Encounter (HOSPITAL_COMMUNITY)
Admission: RE | Disposition: A | Discharge status: Home / Self Care | Payer: Self-pay | Source: Home / Self Care | Attending: Cardiothoracic Surgery

## 2020-03-03 ENCOUNTER — Inpatient Hospital Stay (HOSPITAL_COMMUNITY)
Admission: RE | Admit: 2020-03-03 | Discharge: 2020-03-12 | DRG: 220 | Disposition: A | Discharge status: Home / Self Care | Payer: Commercial Managed Care - PPO | Attending: Cardiothoracic Surgery | Admitting: Cardiothoracic Surgery

## 2020-03-03 DIAGNOSIS — Z87891 Personal history of nicotine dependence: Secondary | ICD-10-CM | POA: Diagnosis not present

## 2020-03-03 DIAGNOSIS — J90 Pleural effusion, not elsewhere classified: Secondary | ICD-10-CM

## 2020-03-03 DIAGNOSIS — D689 Coagulation defect, unspecified: Secondary | ICD-10-CM | POA: Diagnosis not present

## 2020-03-03 DIAGNOSIS — Z955 Presence of coronary angioplasty implant and graft: Secondary | ICD-10-CM | POA: Diagnosis not present

## 2020-03-03 DIAGNOSIS — Z952 Presence of prosthetic heart valve: Secondary | ICD-10-CM

## 2020-03-03 DIAGNOSIS — E8770 Fluid overload, unspecified: Secondary | ICD-10-CM | POA: Diagnosis not present

## 2020-03-03 DIAGNOSIS — E119 Type 2 diabetes mellitus without complications: Secondary | ICD-10-CM | POA: Diagnosis present

## 2020-03-03 DIAGNOSIS — Z419 Encounter for procedure for purposes other than remedying health state, unspecified: Secondary | ICD-10-CM

## 2020-03-03 DIAGNOSIS — R0902 Hypoxemia: Secondary | ICD-10-CM | POA: Diagnosis not present

## 2020-03-03 DIAGNOSIS — Z951 Presence of aortocoronary bypass graft: Secondary | ICD-10-CM

## 2020-03-03 DIAGNOSIS — I10 Essential (primary) hypertension: Secondary | ICD-10-CM | POA: Diagnosis not present

## 2020-03-03 DIAGNOSIS — I350 Nonrheumatic aortic (valve) stenosis: Principal | ICD-10-CM | POA: Diagnosis present

## 2020-03-03 DIAGNOSIS — Z79899 Other long term (current) drug therapy: Secondary | ICD-10-CM

## 2020-03-03 DIAGNOSIS — I252 Old myocardial infarction: Secondary | ICD-10-CM | POA: Diagnosis not present

## 2020-03-03 DIAGNOSIS — Z20822 Contact with and (suspected) exposure to covid-19: Secondary | ICD-10-CM | POA: Diagnosis present

## 2020-03-03 DIAGNOSIS — D72829 Elevated white blood cell count, unspecified: Secondary | ICD-10-CM | POA: Diagnosis not present

## 2020-03-03 DIAGNOSIS — E785 Hyperlipidemia, unspecified: Secondary | ICD-10-CM | POA: Diagnosis present

## 2020-03-03 DIAGNOSIS — I2582 Chronic total occlusion of coronary artery: Secondary | ICD-10-CM | POA: Diagnosis present

## 2020-03-03 DIAGNOSIS — Z7984 Long term (current) use of oral hypoglycemic drugs: Secondary | ICD-10-CM

## 2020-03-03 DIAGNOSIS — K219 Gastro-esophageal reflux disease without esophagitis: Secondary | ICD-10-CM | POA: Diagnosis present

## 2020-03-03 DIAGNOSIS — D62 Acute posthemorrhagic anemia: Secondary | ICD-10-CM | POA: Diagnosis not present

## 2020-03-03 DIAGNOSIS — Z452 Encounter for adjustment and management of vascular access device: Secondary | ICD-10-CM

## 2020-03-03 DIAGNOSIS — E7800 Pure hypercholesterolemia, unspecified: Secondary | ICD-10-CM | POA: Diagnosis not present

## 2020-03-03 DIAGNOSIS — I454 Nonspecific intraventricular block: Secondary | ICD-10-CM | POA: Diagnosis present

## 2020-03-03 DIAGNOSIS — Z7982 Long term (current) use of aspirin: Secondary | ICD-10-CM | POA: Diagnosis not present

## 2020-03-03 DIAGNOSIS — I25110 Atherosclerotic heart disease of native coronary artery with unstable angina pectoris: Secondary | ICD-10-CM | POA: Diagnosis present

## 2020-03-03 DIAGNOSIS — E669 Obesity, unspecified: Secondary | ICD-10-CM | POA: Diagnosis present

## 2020-03-03 DIAGNOSIS — I4891 Unspecified atrial fibrillation: Secondary | ICD-10-CM | POA: Diagnosis not present

## 2020-03-03 DIAGNOSIS — I2510 Atherosclerotic heart disease of native coronary artery without angina pectoris: Secondary | ICD-10-CM | POA: Diagnosis not present

## 2020-03-03 HISTORY — PX: FLEXIBLE BRONCHOSCOPY: SHX5094

## 2020-03-03 HISTORY — PX: TEE WITHOUT CARDIOVERSION: SHX5443

## 2020-03-03 HISTORY — PX: AORTIC VALVE REPLACEMENT: SHX41

## 2020-03-03 HISTORY — PX: CORONARY ARTERY BYPASS GRAFT: SHX141

## 2020-03-03 LAB — BASIC METABOLIC PANEL
Anion gap: 8 (ref 5–15)
BUN: 11 mg/dL (ref 6–20)
CO2: 22 mmol/L (ref 22–32)
Calcium: 7.5 mg/dL — ABNORMAL LOW (ref 8.9–10.3)
Chloride: 109 mmol/L (ref 98–111)
Creatinine, Ser: 0.94 mg/dL (ref 0.61–1.24)
GFR calc Af Amer: >60 mL/min (ref >60)
GFR calc non Af Amer: >60 mL/min (ref >60)
Glucose, Bld: 157 mg/dL — ABNORMAL HIGH (ref 70–99)
Potassium: 3.7 mmol/L (ref 3.5–5.1)
Sodium: 139 mmol/L (ref 135–145)

## 2020-03-03 LAB — GLUCOSE, CAPILLARY
Glucose-Capillary: 122 mg/dL — ABNORMAL HIGH (ref 70–99)
Glucose-Capillary: 135 mg/dL — ABNORMAL HIGH (ref 70–99)
Glucose-Capillary: 137 mg/dL — ABNORMAL HIGH (ref 70–99)
Glucose-Capillary: 135 mg/dL — ABNORMAL HIGH (ref 70–99)
Glucose-Capillary: 148 mg/dL — ABNORMAL HIGH (ref 70–99)
Glucose-Capillary: 126 mg/dL — ABNORMAL HIGH (ref 70–99)

## 2020-03-03 LAB — DIC (DISSEMINATED INTRAVASCULAR COAGULATION)PANEL
D-Dimer, Quant: 0.72 ug/mL-FEU — ABNORMAL HIGH (ref 0.00–0.50)
D-Dimer, Quant: 0.49 ug/mL-FEU (ref 0.00–0.50)
Fibrinogen: 138 mg/dL — ABNORMAL LOW (ref 210–475)
Fibrinogen: 304 mg/dL (ref 210–475)
INR: 1.7 — ABNORMAL HIGH (ref 0.8–1.2)
INR: 1.3 — ABNORMAL HIGH (ref 0.8–1.2)
Platelets: 125 10*3/uL — ABNORMAL LOW (ref 150–400)
Platelets: 131 10*3/uL — ABNORMAL LOW (ref 150–400)
Prothrombin Time: 19.6 seconds — ABNORMAL HIGH (ref 11.4–15.2)
Prothrombin Time: 15.3 seconds — ABNORMAL HIGH (ref 11.4–15.2)
Smear Review: NONE SEEN
Smear Review: NONE SEEN
aPTT: 32 seconds (ref 24–36)
aPTT: 33 seconds (ref 24–36)

## 2020-03-03 LAB — ECHO INTRAOPERATIVE TEE
Height: 66 in
Weight: 3121.71 oz

## 2020-03-03 LAB — POCT I-STAT 7, (LYTES, BLD GAS, ICA,H+H)
Acid-Base Excess: 0 mmol/L (ref 0.0–2.0)
Acid-base deficit: 1 mmol/L (ref 0.0–2.0)
Bicarbonate: 24.3 mmol/L (ref 20.0–28.0)
Bicarbonate: 24.7 mmol/L (ref 20.0–28.0)
Calcium, Ion: 1.12 mmol/L — ABNORMAL LOW (ref 1.15–1.40)
Calcium, Ion: 1.13 mmol/L — ABNORMAL LOW (ref 1.15–1.40)
HCT: 32 % — ABNORMAL LOW (ref 39.0–52.0)
HCT: 26 % — ABNORMAL LOW (ref 39.0–52.0)
Hemoglobin: 10.9 g/dL — ABNORMAL LOW (ref 13.0–17.0)
Hemoglobin: 8.8 g/dL — ABNORMAL LOW (ref 13.0–17.0)
O2 Saturation: 97 %
O2 Saturation: 99 %
Patient temperature: 36.6
Patient temperature: 36.9
Potassium: 4.2 mmol/L (ref 3.5–5.1)
Potassium: 3.9 mmol/L (ref 3.5–5.1)
Sodium: 142 mmol/L (ref 135–145)
Sodium: 142 mmol/L (ref 135–145)
TCO2: 26 mmol/L (ref 22–32)
TCO2: 26 mmol/L (ref 22–32)
pCO2 arterial: 42.3 mmHg (ref 32.0–48.0)
pCO2 arterial: 40.9 mmHg (ref 32.0–48.0)
pH, Arterial: 7.365 (ref 7.350–7.450)
pH, Arterial: 7.388 (ref 7.350–7.450)
pO2, Arterial: 95 mmHg (ref 83.0–108.0)
pO2, Arterial: 128 mmHg — ABNORMAL HIGH (ref 83.0–108.0)

## 2020-03-03 LAB — CBC
HCT: 30.4 % — ABNORMAL LOW (ref 39.0–52.0)
HCT: 25.3 % — ABNORMAL LOW (ref 39.0–52.0)
Hemoglobin: 9.6 g/dL — ABNORMAL LOW (ref 13.0–17.0)
Hemoglobin: 8.1 g/dL — ABNORMAL LOW (ref 13.0–17.0)
MCH: 28.3 pg (ref 26.0–34.0)
MCH: 28.5 pg (ref 26.0–34.0)
MCHC: 31.6 g/dL (ref 30.0–36.0)
MCHC: 32 g/dL (ref 30.0–36.0)
MCV: 89.7 fL (ref 80.0–100.0)
MCV: 89.1 fL (ref 80.0–100.0)
Platelets: 106 10*3/uL — ABNORMAL LOW (ref 150–400)
Platelets: 125 10*3/uL — ABNORMAL LOW (ref 150–400)
RBC: 3.39 MIL/uL — ABNORMAL LOW (ref 4.22–5.81)
RBC: 2.84 MIL/uL — ABNORMAL LOW (ref 4.22–5.81)
RDW: 13.6 % (ref 11.5–15.5)
RDW: 13.9 % (ref 11.5–15.5)
WBC: 15.7 10*3/uL — ABNORMAL HIGH (ref 4.0–10.5)
WBC: 11.3 10*3/uL — ABNORMAL HIGH (ref 4.0–10.5)
nRBC: 0 % (ref 0.0–0.2)
nRBC: 0 % (ref 0.0–0.2)

## 2020-03-03 LAB — MAGNESIUM: Magnesium: 2.9 mg/dL — ABNORMAL HIGH (ref 1.7–2.4)

## 2020-03-03 LAB — HEMOGLOBIN AND HEMATOCRIT, BLOOD
HCT: 21.9 % — ABNORMAL LOW (ref 39.0–52.0)
Hemoglobin: 6.7 g/dL — CL (ref 13.0–17.0)

## 2020-03-03 LAB — PROTIME-INR
INR: 1.4 — ABNORMAL HIGH (ref 0.8–1.2)
Prothrombin Time: 16.6 seconds — ABNORMAL HIGH (ref 11.4–15.2)

## 2020-03-03 LAB — PLATELET COUNT: Platelets: 155 10*3/uL (ref 150–400)

## 2020-03-03 LAB — PREPARE RBC (CROSSMATCH)

## 2020-03-03 LAB — APTT: aPTT: 29 seconds (ref 24–36)

## 2020-03-03 SURGERY — REPLACEMENT, AORTIC VALVE, OPEN
Anesthesia: General | Site: Esophagus

## 2020-03-03 MED ORDER — ASPIRIN 81 MG PO CHEW: 324.0000 mg | CHEWABLE_TABLET | Freq: Every day | ORAL | Status: DC

## 2020-03-03 MED ORDER — FAMOTIDINE IN NACL 20-0.9 MG/50ML-% IV SOLN
INTRAVENOUS | Status: AC
  Administered 2020-03-03: 20 mg via INTRAVENOUS
  Filled 2020-03-03: qty 50

## 2020-03-03 MED ORDER — ROCURONIUM BROMIDE 10 MG/ML (PF) SYRINGE
PREFILLED_SYRINGE | INTRAVENOUS | Status: AC
  Filled 2020-03-03: qty 10

## 2020-03-03 MED ORDER — DEXMEDETOMIDINE HCL IN NACL 400 MCG/100ML IV SOLN
0.4000 ug/kg/h | INTRAVENOUS | Status: DC
  Administered 2020-03-03: 1.2 ug/kg/h via INTRAVENOUS
  Administered 2020-03-03: 1 ug/kg/h via INTRAVENOUS
  Administered 2020-03-04: 1.2 ug/kg/h via INTRAVENOUS
  Filled 2020-03-03 (×4): qty 100

## 2020-03-03 MED ORDER — CHLORHEXIDINE GLUCONATE 0.12 % MT SOLN
15.0000 mL | OROMUCOSAL | Status: AC
  Administered 2020-03-03: 15 mL via OROMUCOSAL

## 2020-03-03 MED ORDER — SODIUM CHLORIDE 0.9 % IV SOLN: INTRAVENOUS | Status: DC

## 2020-03-03 MED ORDER — METOPROLOL TARTRATE 25 MG/10 ML ORAL SUSPENSION: 12.5000 mg | Freq: Two times a day (BID) | ORAL | Status: DC

## 2020-03-03 MED ORDER — MIDAZOLAM HCL (PF) 10 MG/2ML IJ SOLN
INTRAMUSCULAR | Status: AC
  Filled 2020-03-03: qty 2

## 2020-03-03 MED ORDER — ROCURONIUM BROMIDE 10 MG/ML (PF) SYRINGE
PREFILLED_SYRINGE | INTRAVENOUS | Status: DC | PRN
  Administered 2020-03-03: 25 mg via INTRAVENOUS
  Administered 2020-03-03: 100 mg via INTRAVENOUS
  Administered 2020-03-03: 50 mg via INTRAVENOUS
  Administered 2020-03-03: 20 mg via INTRAVENOUS
  Administered 2020-03-03 (×2): 50 mg via INTRAVENOUS

## 2020-03-03 MED ORDER — OXYCODONE HCL 5 MG PO TABS
5.0000 mg | ORAL_TABLET | ORAL | Status: DC | PRN
  Administered 2020-03-04 – 2020-03-05 (×5): 10 mg via ORAL
  Filled 2020-03-03 (×5): qty 2

## 2020-03-03 MED ORDER — DEXTROSE 50 % IV SOLN: 0.0000 mL | INTRAVENOUS | Status: DC | PRN

## 2020-03-03 MED ORDER — LACTATED RINGERS IV SOLN: INTRAVENOUS | Status: DC | PRN

## 2020-03-03 MED ORDER — METOPROLOL TARTRATE 12.5 MG HALF TABLET: 12.5000 mg | ORAL_TABLET | Freq: Two times a day (BID) | ORAL | Status: DC

## 2020-03-03 MED ORDER — ACETAMINOPHEN 650 MG RE SUPP: 650.0000 mg | Freq: Once | RECTAL | Status: AC

## 2020-03-03 MED ORDER — ACETAMINOPHEN 500 MG PO TABS
1000.0000 mg | ORAL_TABLET | Freq: Four times a day (QID) | ORAL | Status: AC
  Administered 2020-03-04 – 2020-03-08 (×18): 1000 mg via ORAL
  Filled 2020-03-03 (×18): qty 2

## 2020-03-03 MED ORDER — LIDOCAINE 2% (20 MG/ML) 5 ML SYRINGE
INTRAMUSCULAR | Status: AC
  Filled 2020-03-03: qty 5

## 2020-03-03 MED ORDER — ACETAMINOPHEN 160 MG/5ML PO SOLN
650.0000 mg | Freq: Once | ORAL | Status: AC
  Administered 2020-03-03: 650 mg

## 2020-03-03 MED ORDER — BISACODYL 5 MG PO TBEC
10.0000 mg | DELAYED_RELEASE_TABLET | Freq: Every day | ORAL | Status: DC
  Administered 2020-03-04 – 2020-03-09 (×6): 10 mg via ORAL
  Filled 2020-03-03 (×6): qty 2

## 2020-03-03 MED ORDER — VANCOMYCIN HCL IN DEXTROSE 1-5 GM/200ML-% IV SOLN
1000.0000 mg | Freq: Once | INTRAVENOUS | Status: AC
  Administered 2020-03-03: 1000 mg via INTRAVENOUS
  Filled 2020-03-03: qty 200

## 2020-03-03 MED ORDER — LACTATED RINGERS IV SOLN: INTRAVENOUS | Status: DC

## 2020-03-03 MED ORDER — ACETYLCYSTEINE 20 % IN SOLN
4.0000 mL | Freq: Four times a day (QID) | RESPIRATORY_TRACT | Status: DC
  Administered 2020-03-04 – 2020-03-05 (×7): 4 mL via RESPIRATORY_TRACT
  Filled 2020-03-03 (×9): qty 4

## 2020-03-03 MED ORDER — HEPARIN SODIUM (PORCINE) 1000 UNIT/ML IJ SOLN
INTRAMUSCULAR | Status: DC | PRN
  Administered 2020-03-03: 2000 [IU] via INTRAVENOUS
  Administered 2020-03-03: 29000 [IU] via INTRAVENOUS

## 2020-03-03 MED ORDER — BISACODYL 10 MG RE SUPP: 10.0000 mg | Freq: Every day | RECTAL | Status: DC

## 2020-03-03 MED ORDER — METOPROLOL TARTRATE 12.5 MG HALF TABLET
12.5000 mg | ORAL_TABLET | Freq: Once | ORAL | Status: DC
  Filled 2020-03-03: qty 1

## 2020-03-03 MED ORDER — SODIUM CHLORIDE (PF) 0.9 % IJ SOLN
INTRAMUSCULAR | Status: AC
  Filled 2020-03-03: qty 10

## 2020-03-03 MED ORDER — FENTANYL CITRATE (PF) 250 MCG/5ML IJ SOLN
INTRAMUSCULAR | Status: DC | PRN
  Administered 2020-03-03: 150 ug via INTRAVENOUS
  Administered 2020-03-03: 400 ug via INTRAVENOUS
  Administered 2020-03-03 (×2): 100 ug via INTRAVENOUS
  Administered 2020-03-03 (×2): 50 ug via INTRAVENOUS
  Administered 2020-03-03: 250 ug via INTRAVENOUS
  Administered 2020-03-03 (×3): 50 ug via INTRAVENOUS

## 2020-03-03 MED ORDER — SODIUM CHLORIDE 0.9 % IV SOLN
1.5000 g | Freq: Two times a day (BID) | INTRAVENOUS | Status: AC
  Administered 2020-03-03 – 2020-03-05 (×4): 1.5 g via INTRAVENOUS
  Filled 2020-03-03 (×5): qty 1.5

## 2020-03-03 MED ORDER — FENTANYL CITRATE (PF) 250 MCG/5ML IJ SOLN
INTRAMUSCULAR | Status: AC
  Filled 2020-03-03: qty 10

## 2020-03-03 MED ORDER — MIDAZOLAM HCL 5 MG/5ML IJ SOLN
INTRAMUSCULAR | Status: DC | PRN
  Administered 2020-03-03: 2 mg via INTRAVENOUS
  Administered 2020-03-03: 4 mg via INTRAVENOUS
  Administered 2020-03-03 (×2): 2 mg via INTRAVENOUS

## 2020-03-03 MED ORDER — FAMOTIDINE IN NACL 20-0.9 MG/50ML-% IV SOLN
20.0000 mg | Freq: Two times a day (BID) | INTRAVENOUS | Status: AC
  Administered 2020-03-04: 20 mg via INTRAVENOUS
  Filled 2020-03-03: qty 50

## 2020-03-03 MED ORDER — PHENYLEPHRINE 40 MCG/ML (10ML) SYRINGE FOR IV PUSH (FOR BLOOD PRESSURE SUPPORT)
PREFILLED_SYRINGE | INTRAVENOUS | Status: AC
  Filled 2020-03-03: qty 10

## 2020-03-03 MED ORDER — LEVALBUTEROL HCL 1.25 MG/0.5ML IN NEBU
1.2500 mg | INHALATION_SOLUTION | Freq: Four times a day (QID) | RESPIRATORY_TRACT | Status: DC
  Administered 2020-03-03 – 2020-03-05 (×8): 1.25 mg via RESPIRATORY_TRACT
  Filled 2020-03-03 (×8): qty 0.5

## 2020-03-03 MED ORDER — ACETYLCYSTEINE 20 % IN SOLN
4.0000 mL | RESPIRATORY_TRACT | Status: DC
  Administered 2020-03-03: 4 mL via RESPIRATORY_TRACT
  Filled 2020-03-03 (×4): qty 4

## 2020-03-03 MED ORDER — PANTOPRAZOLE SODIUM 40 MG PO TBEC
40.0000 mg | DELAYED_RELEASE_TABLET | Freq: Every day | ORAL | Status: DC
  Administered 2020-03-04 – 2020-03-12 (×9): 40 mg via ORAL
  Filled 2020-03-03 (×9): qty 1

## 2020-03-03 MED ORDER — SODIUM CHLORIDE 0.9 % IV SOLN: 250.0000 mL | INTRAVENOUS | Status: DC

## 2020-03-03 MED ORDER — DOCUSATE SODIUM 100 MG PO CAPS
200.0000 mg | ORAL_CAPSULE | Freq: Every day | ORAL | Status: DC
  Administered 2020-03-04 – 2020-03-09 (×6): 200 mg via ORAL
  Filled 2020-03-03 (×6): qty 2

## 2020-03-03 MED ORDER — PROTAMINE SULFATE 10 MG/ML IV SOLN
INTRAVENOUS | Status: DC | PRN
  Administered 2020-03-03: 280 mg via INTRAVENOUS
  Administered 2020-03-03: 50 mg via INTRAVENOUS

## 2020-03-03 MED ORDER — CHLORHEXIDINE GLUCONATE 0.12 % MT SOLN
15.0000 mL | Freq: Once | OROMUCOSAL | Status: AC
  Administered 2020-03-03: 15 mL via OROMUCOSAL
  Filled 2020-03-03: qty 15

## 2020-03-03 MED ORDER — ONDANSETRON HCL 4 MG/2ML IJ SOLN
4.0000 mg | Freq: Four times a day (QID) | INTRAMUSCULAR | Status: DC | PRN
  Administered 2020-03-03: 4 mg via INTRAVENOUS
  Filled 2020-03-03: qty 2

## 2020-03-03 MED ORDER — FENTANYL CITRATE (PF) 250 MCG/5ML IJ SOLN
INTRAMUSCULAR | Status: AC
  Filled 2020-03-03: qty 5

## 2020-03-03 MED ORDER — MIDAZOLAM HCL 2 MG/2ML IJ SOLN
2.0000 mg | INTRAMUSCULAR | Status: DC | PRN
  Administered 2020-03-03 – 2020-03-04 (×7): 2 mg via INTRAVENOUS
  Filled 2020-03-03 (×8): qty 2

## 2020-03-03 MED ORDER — PLASMA-LYTE 148 IV SOLN
INTRAVENOUS | Status: DC | PRN
  Administered 2020-03-03: 500 mL via INTRAVASCULAR

## 2020-03-03 MED ORDER — PROTAMINE SULFATE 10 MG/ML IV SOLN
INTRAVENOUS | Status: AC
  Filled 2020-03-03: qty 5

## 2020-03-03 MED ORDER — ALBUMIN HUMAN 5 % IV SOLN
250.0000 mL | INTRAVENOUS | Status: DC | PRN
  Administered 2020-03-03 – 2020-03-04 (×2): 12.5 g via INTRAVENOUS
  Filled 2020-03-03: qty 250

## 2020-03-03 MED ORDER — ASPIRIN EC 325 MG PO TBEC
325.0000 mg | DELAYED_RELEASE_TABLET | Freq: Every day | ORAL | Status: DC
  Administered 2020-03-04 – 2020-03-12 (×9): 325 mg via ORAL
  Filled 2020-03-03 (×9): qty 1

## 2020-03-03 MED ORDER — SODIUM CHLORIDE 0.9% FLUSH: 3.0000 mL | INTRAVENOUS | Status: DC | PRN

## 2020-03-03 MED ORDER — PHENYLEPHRINE 40 MCG/ML (10ML) SYRINGE FOR IV PUSH (FOR BLOOD PRESSURE SUPPORT)
PREFILLED_SYRINGE | INTRAVENOUS | Status: DC | PRN
  Administered 2020-03-03: 80 ug via INTRAVENOUS

## 2020-03-03 MED ORDER — CHLORHEXIDINE GLUCONATE 4 % EX LIQD: 30.0000 mL | CUTANEOUS | Status: DC

## 2020-03-03 MED ORDER — METOPROLOL TARTRATE 5 MG/5ML IV SOLN
2.5000 mg | INTRAVENOUS | Status: DC | PRN
  Administered 2020-03-08 – 2020-03-09 (×2): 5 mg via INTRAVENOUS
  Filled 2020-03-03 (×2): qty 5

## 2020-03-03 MED ORDER — CHLORHEXIDINE GLUCONATE 0.12% ORAL RINSE (MEDLINE KIT)
15.0000 mL | Freq: Two times a day (BID) | OROMUCOSAL | Status: DC
  Administered 2020-03-03 – 2020-03-04 (×2): 15 mL via OROMUCOSAL

## 2020-03-03 MED ORDER — TRAMADOL HCL 50 MG PO TABS
50.0000 mg | ORAL_TABLET | ORAL | Status: DC | PRN
  Administered 2020-03-04: 100 mg via ORAL
  Administered 2020-03-06: 50 mg via ORAL
  Administered 2020-03-07 – 2020-03-10 (×7): 100 mg via ORAL
  Administered 2020-03-12: 50 mg via ORAL
  Filled 2020-03-03: qty 2
  Filled 2020-03-03: qty 1
  Filled 2020-03-03 (×5): qty 2
  Filled 2020-03-03: qty 1
  Filled 2020-03-03 (×4): qty 2

## 2020-03-03 MED ORDER — ACETAMINOPHEN 160 MG/5ML PO SOLN
1000.0000 mg | Freq: Four times a day (QID) | ORAL | Status: AC
  Administered 2020-03-03 – 2020-03-04 (×2): 1000 mg
  Filled 2020-03-03 (×2): qty 40.6

## 2020-03-03 MED ORDER — MAGNESIUM SULFATE 4 GM/100ML IV SOLN
INTRAVENOUS | Status: AC
  Administered 2020-03-03: 4 g via INTRAVENOUS
  Filled 2020-03-03: qty 100

## 2020-03-03 MED ORDER — SODIUM CHLORIDE 0.45 % IV SOLN
INTRAVENOUS | Status: DC | PRN
  Administered 2020-03-05: 250 mL via INTRAVENOUS

## 2020-03-03 MED ORDER — HEMOSTATIC AGENTS (NO CHARGE) OPTIME
TOPICAL | Status: DC | PRN
  Administered 2020-03-03 (×2): 1 via TOPICAL
  Administered 2020-03-03: 2 via TOPICAL
  Administered 2020-03-03: 1 via TOPICAL

## 2020-03-03 MED ORDER — SIMVASTATIN 20 MG PO TABS
20.0000 mg | ORAL_TABLET | Freq: Every evening | ORAL | Status: DC
  Administered 2020-03-04 – 2020-03-11 (×8): 20 mg via ORAL
  Filled 2020-03-03 (×8): qty 1

## 2020-03-03 MED ORDER — MAGNESIUM SULFATE 4 GM/100ML IV SOLN: 4.0000 g | Freq: Once | INTRAVENOUS | Status: AC

## 2020-03-03 MED ORDER — 0.9 % SODIUM CHLORIDE (POUR BTL) OPTIME
TOPICAL | Status: DC | PRN
  Administered 2020-03-03: 5000 mL

## 2020-03-03 MED ORDER — SODIUM CHLORIDE 0.9 % IV SOLN
20.0000 ug | Freq: Once | INTRAVENOUS | Status: AC
  Administered 2020-03-03: 20 ug via INTRAVENOUS
  Filled 2020-03-03: qty 5

## 2020-03-03 MED ORDER — SODIUM CHLORIDE 0.9% IV SOLUTION: Freq: Once | INTRAVENOUS | Status: DC

## 2020-03-03 MED ORDER — LACTATED RINGERS IV SOLN: 500.0000 mL | Freq: Once | INTRAVENOUS | Status: DC | PRN

## 2020-03-03 MED ORDER — INSULIN REGULAR(HUMAN) IN NACL 100-0.9 UT/100ML-% IV SOLN: INTRAVENOUS | Status: DC

## 2020-03-03 MED ORDER — SODIUM BICARBONATE 8.4 % IV SOLN
INTRAVENOUS | Status: DC | PRN
Start: 2020-03-03 — End: 2020-03-03
  Administered 2020-03-03: 50 meq via INTRAVENOUS

## 2020-03-03 MED ORDER — HEPARIN SODIUM (PORCINE) 1000 UNIT/ML IJ SOLN
INTRAMUSCULAR | Status: AC
  Filled 2020-03-03: qty 1

## 2020-03-03 MED ORDER — PROPOFOL 10 MG/ML IV BOLUS
INTRAVENOUS | Status: AC
  Filled 2020-03-03: qty 20

## 2020-03-03 MED ORDER — NOREPINEPHRINE 4 MG/250ML-% IV SOLN: 0.0000 ug/min | INTRAVENOUS | Status: DC

## 2020-03-03 MED ORDER — MORPHINE SULFATE (PF) 2 MG/ML IV SOLN
1.0000 mg | INTRAVENOUS | Status: DC | PRN
  Administered 2020-03-03 (×2): 4 mg via INTRAVENOUS
  Administered 2020-03-03: 2 mg via INTRAVENOUS
  Administered 2020-03-03: 4 mg via INTRAVENOUS
  Administered 2020-03-04: 2 mg via INTRAVENOUS
  Administered 2020-03-04: 4 mg via INTRAVENOUS
  Administered 2020-03-04: 2 mg via INTRAVENOUS
  Administered 2020-03-04: 4 mg via INTRAVENOUS
  Administered 2020-03-04: 2 mg via INTRAVENOUS
  Filled 2020-03-03 (×6): qty 2
  Filled 2020-03-03: qty 1

## 2020-03-03 MED ORDER — SODIUM CHLORIDE 0.9% FLUSH
10.0000 mL | Freq: Two times a day (BID) | INTRAVENOUS | Status: DC
  Administered 2020-03-03 – 2020-03-06 (×5): 10 mL
  Administered 2020-03-07: 20 mL
  Administered 2020-03-08 – 2020-03-09 (×4): 10 mL
  Administered 2020-03-10: 20 mL
  Administered 2020-03-10: 10 mL

## 2020-03-03 MED ORDER — SODIUM CHLORIDE 0.9% FLUSH
3.0000 mL | Freq: Two times a day (BID) | INTRAVENOUS | Status: DC
  Administered 2020-03-04 – 2020-03-12 (×8): 3 mL via INTRAVENOUS

## 2020-03-03 MED ORDER — SODIUM CHLORIDE 0.9% FLUSH
10.0000 mL | INTRAVENOUS | Status: DC | PRN
  Administered 2020-03-03: 10 mL

## 2020-03-03 MED ORDER — PROPOFOL 10 MG/ML IV BOLUS
INTRAVENOUS | Status: DC | PRN
  Administered 2020-03-03: 50 mg via INTRAVENOUS

## 2020-03-03 MED ORDER — POTASSIUM CHLORIDE 10 MEQ/50ML IV SOLN
10.0000 meq | INTRAVENOUS | Status: AC
  Administered 2020-03-03 – 2020-03-04 (×3): 10 meq via INTRAVENOUS
  Filled 2020-03-03: qty 50

## 2020-03-03 MED ORDER — POTASSIUM CHLORIDE 10 MEQ/50ML IV SOLN: 10.0000 meq | INTRAVENOUS | Status: AC

## 2020-03-03 MED ORDER — ORAL CARE MOUTH RINSE
15.0000 mL | OROMUCOSAL | Status: DC
  Administered 2020-03-03 – 2020-03-04 (×6): 15 mL via OROMUCOSAL

## 2020-03-03 MED ORDER — CHLORHEXIDINE GLUCONATE CLOTH 2 % EX PADS
6.0000 | MEDICATED_PAD | Freq: Every day | CUTANEOUS | Status: DC
  Administered 2020-03-03 – 2020-03-10 (×7): 6 via TOPICAL

## 2020-03-03 MED ORDER — CALCIUM CHLORIDE 10 % IV SOLN
INTRAVENOUS | Status: DC | PRN
  Administered 2020-03-03: 200 mg via INTRAVENOUS

## 2020-03-03 MED ORDER — MILRINONE LACTATE IN DEXTROSE 20-5 MG/100ML-% IV SOLN
0.2500 ug/kg/min | INTRAVENOUS | Status: DC
  Administered 2020-03-03 – 2020-03-04 (×2): 0.3 ug/kg/min via INTRAVENOUS
  Administered 2020-03-05 – 2020-03-07 (×6): 0.25 ug/kg/min via INTRAVENOUS
  Filled 2020-03-03 (×8): qty 100

## 2020-03-03 SURGICAL SUPPLY — 101 items
ADAPTER CARDIO PERF ANTE/RETRO (ADAPTER) ×5 IMPLANT
BAG DECANTER FOR FLEXI CONT (MISCELLANEOUS) ×5 IMPLANT
BLADE CLIPPER SURG (BLADE) ×5 IMPLANT
BLADE STERNUM SYSTEM 6 (BLADE) ×5 IMPLANT
BLADE SURG 12 STRL SS (BLADE) ×5 IMPLANT
BLADE SURG 15 STRL LF DISP TIS (BLADE) ×3 IMPLANT
BLADE SURG 15 STRL SS (BLADE) ×2
BNDG ELASTIC 4X5.8 VLCR STR LF (GAUZE/BANDAGES/DRESSINGS) ×5 IMPLANT
BNDG ELASTIC 6X5.8 VLCR STR LF (GAUZE/BANDAGES/DRESSINGS) ×5 IMPLANT
BNDG GAUZE ELAST 4 BULKY (GAUZE/BANDAGES/DRESSINGS) ×5 IMPLANT
CANISTER SUCT 3000ML PPV (MISCELLANEOUS) ×5 IMPLANT
CANNULA GUNDRY RCSP 15FR (MISCELLANEOUS) ×5 IMPLANT
CATH CPB KIT VANTRIGT (MISCELLANEOUS) ×5 IMPLANT
CATH HEART VENT LEFT (CATHETERS) ×3 IMPLANT
CATH RETROPLEGIA CORONARY 14FR (CATHETERS) IMPLANT
CATH ROBINSON RED A/P 18FR (CATHETERS) ×15 IMPLANT
CATH THORACIC 28FR RT ANG (CATHETERS) ×5 IMPLANT
CLIP FOGARTY SPRING 6M (CLIP) IMPLANT
CLIP VESOCCLUDE SM WIDE 24/CT (CLIP) ×10 IMPLANT
CONT SPEC 4OZ CLIKSEAL STRL BL (MISCELLANEOUS) ×5 IMPLANT
COVER SURGICAL LIGHT HANDLE (MISCELLANEOUS) ×10 IMPLANT
DRAIN CHANNEL 32F RND 10.7 FF (WOUND CARE) ×5 IMPLANT
DRAPE CARDIOVASCULAR INCISE (DRAPES) ×2
DRAPE SLUSH/WARMER DISC (DRAPES) ×5 IMPLANT
DRAPE SRG 135X102X78XABS (DRAPES) ×3 IMPLANT
DRSG AQUACEL AG ADV 3.5X14 (GAUZE/BANDAGES/DRESSINGS) ×5 IMPLANT
ELECT BLADE 4.0 EZ CLEAN MEGAD (MISCELLANEOUS) ×5
ELECT BLADE 6.5 EXT (BLADE) ×5 IMPLANT
ELECT CAUTERY BLADE 6.4 (BLADE) ×5 IMPLANT
ELECT REM PT RETURN 9FT ADLT (ELECTROSURGICAL) ×10
ELECTRODE BLDE 4.0 EZ CLN MEGD (MISCELLANEOUS) ×3 IMPLANT
ELECTRODE REM PT RTRN 9FT ADLT (ELECTROSURGICAL) ×6 IMPLANT
FELT TEFLON 1X6 (MISCELLANEOUS) ×10 IMPLANT
GAUZE SPONGE 4X4 12PLY STRL (GAUZE/BANDAGES/DRESSINGS) ×10 IMPLANT
GLOVE BIO SURGEON STRL SZ7.5 (GLOVE) ×15 IMPLANT
GOWN STRL REUS W/ TWL LRG LVL3 (GOWN DISPOSABLE) ×12 IMPLANT
GOWN STRL REUS W/TWL LRG LVL3 (GOWN DISPOSABLE) ×8
HEMOSTAT POWDER SURGIFOAM 1G (HEMOSTASIS) ×15 IMPLANT
HEMOSTAT SURGICEL 2X14 (HEMOSTASIS) ×5 IMPLANT
INSERT FOGARTY XLG (MISCELLANEOUS) IMPLANT
KIT BASIN OR (CUSTOM PROCEDURE TRAY) ×5 IMPLANT
KIT SUCTION CATH 14FR (SUCTIONS) ×5 IMPLANT
KIT TURNOVER KIT B (KITS) ×5 IMPLANT
KIT VASOVIEW HEMOPRO 2 VH 4000 (KITS) ×5 IMPLANT
LEAD PACING MYOCARDI (MISCELLANEOUS) ×5 IMPLANT
LINE VENT (MISCELLANEOUS) ×5 IMPLANT
MARKER GRAFT CORONARY BYPASS (MISCELLANEOUS) ×15 IMPLANT
NS IRRIG 1000ML POUR BTL (IV SOLUTION) ×30 IMPLANT
PACK E OPEN HEART (SUTURE) ×5 IMPLANT
PACK OPEN HEART (CUSTOM PROCEDURE TRAY) ×5 IMPLANT
PAD ARMBOARD 7.5X6 YLW CONV (MISCELLANEOUS) ×10 IMPLANT
PAD ELECT DEFIB RADIOL ZOLL (MISCELLANEOUS) ×5 IMPLANT
PENCIL BUTTON HOLSTER BLD 10FT (ELECTRODE) ×5 IMPLANT
POSITIONER HEAD DONUT 9IN (MISCELLANEOUS) ×5 IMPLANT
PUNCH AORTIC ROTATE 4.0MM (MISCELLANEOUS) IMPLANT
PUNCH AORTIC ROTATE 4.5MM 8IN (MISCELLANEOUS) IMPLANT
PUNCH AORTIC ROTATE 5MM 8IN (MISCELLANEOUS) IMPLANT
SET CARDIOPLEGIA MPS 5001102 (MISCELLANEOUS) ×5 IMPLANT
SPONGE LAP 18X18 X RAY DECT (DISPOSABLE) ×10 IMPLANT
SPONGE LAP 4X18 RFD (DISPOSABLE) ×10 IMPLANT
SUPPORT HEART JANKE-BARRON (MISCELLANEOUS) ×5 IMPLANT
SURGIFLO W/THROMBIN 8M KIT (HEMOSTASIS) ×5 IMPLANT
SUT BONE WAX W31G (SUTURE) ×5 IMPLANT
SUT ETHIBON 2 0 V 52N 30 (SUTURE) ×10 IMPLANT
SUT ETHIBOND 2 0 SH (SUTURE) ×2
SUT ETHIBOND 2 0 SH 36X2 (SUTURE) ×3 IMPLANT
SUT MNCRL AB 4-0 PS2 18 (SUTURE) ×5 IMPLANT
SUT PROLENE 3 0 RB 1 (SUTURE) ×5 IMPLANT
SUT PROLENE 3 0 SH 1 (SUTURE) IMPLANT
SUT PROLENE 3 0 SH DA (SUTURE) ×5 IMPLANT
SUT PROLENE 3 0 SH1 36 (SUTURE) IMPLANT
SUT PROLENE 4 0 RB 1 (SUTURE) ×10
SUT PROLENE 4 0 SH DA (SUTURE) ×10 IMPLANT
SUT PROLENE 4-0 RB1 .5 CRCL 36 (SUTURE) ×15 IMPLANT
SUT PROLENE 5 0 C 1 36 (SUTURE) IMPLANT
SUT PROLENE 6 0 C 1 30 (SUTURE) IMPLANT
SUT PROLENE 6 0 CC (SUTURE) ×40 IMPLANT
SUT PROLENE 8 0 BV175 6 (SUTURE) IMPLANT
SUT PROLENE BLUE 7 0 (SUTURE) ×10 IMPLANT
SUT SILK  1 MH (SUTURE)
SUT SILK 1 MH (SUTURE) IMPLANT
SUT SILK 2 0 SH CR/8 (SUTURE) ×5 IMPLANT
SUT SILK 3 0 SH CR/8 (SUTURE) IMPLANT
SUT STEEL 6MS V (SUTURE) ×10 IMPLANT
SUT STEEL SZ 6 DBL 3X14 BALL (SUTURE) ×5 IMPLANT
SUT VIC AB 1 CTX 36 (SUTURE) ×4
SUT VIC AB 1 CTX36XBRD ANBCTR (SUTURE) ×6 IMPLANT
SUT VIC AB 2-0 CT1 27 (SUTURE) ×2
SUT VIC AB 2-0 CT1 TAPERPNT 27 (SUTURE) ×3 IMPLANT
SUT VIC AB 2-0 CTX 27 (SUTURE) IMPLANT
SUT VIC AB 3-0 X1 27 (SUTURE) IMPLANT
SYR 10ML KIT SKIN ADHESIVE (MISCELLANEOUS) IMPLANT
SYSTEM SAHARA CHEST DRAIN ATS (WOUND CARE) ×5 IMPLANT
TOWEL GREEN STERILE (TOWEL DISPOSABLE) ×5 IMPLANT
TOWEL GREEN STERILE FF (TOWEL DISPOSABLE) ×5 IMPLANT
TRAY FOLEY SLVR 16FR TEMP STAT (SET/KITS/TRAYS/PACK) ×5 IMPLANT
TUBING LAP HI FLOW INSUFFLATIO (TUBING) ×5 IMPLANT
UNDERPAD 30X36 HEAVY ABSORB (UNDERPADS AND DIAPERS) ×5 IMPLANT
VALVE AORTIC SZ21 INSP/RESIL (Valve) ×5 IMPLANT
VENT LEFT HEART 12002 (CATHETERS) ×5
WATER STERILE IRR 1000ML POUR (IV SOLUTION) ×10 IMPLANT

## 2020-03-03 NOTE — OR Nursing (Signed)
1st call made to [place], RN on [facility].

## 2020-03-03 NOTE — Progress Notes (Signed)
CT surgery p.m. 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  We will plan on leaving intubated overnight for rapid wean extubation at morning rounds.

## 2020-03-03 NOTE — Anesthesia Procedure Notes (Signed)
Central Venous Catheter Insertion Performed by: Catalina Gravel, MD, anesthesiologist Start/End7/10/2019 7:05 AM, 03/03/2020 7:15 AM Patient location: Pre-op. Preanesthetic checklist: patient identified, IV checked, site marked, risks and benefits discussed, surgical consent, monitors and equipment checked, pre-op evaluation, timeout performed and anesthesia consent Position: Trendelenburg Lidocaine 1% used for infiltration and patient sedated Hand hygiene performed , maximum sterile barriers used  and Seldinger technique used Catheter size: 9 Fr Central line was placed.MAC introducer Procedure performed using ultrasound guided technique. Ultrasound Notes:anatomy identified, needle tip was noted to be adjacent to the nerve/plexus identified, no ultrasound evidence of intravascular and/or intraneural injection and image(s) printed for medical record Attempts: 1 Following insertion, line sutured, dressing applied and Biopatch. Post procedure assessment: free fluid flow, blood return through all ports and no air  Patient tolerated the procedure well with no immediate complications.

## 2020-03-03 NOTE — OR Nursing (Signed)
2nd call made to [place].

## 2020-03-03 NOTE — Progress Notes (Signed)
  Echocardiogram Echocardiogram Transesophageal has been performed.  Curtis Maxwell 03/03/2020, 8:59 AM

## 2020-03-03 NOTE — Anesthesia Procedure Notes (Signed)
Procedure Name: Intubation Date/Time: 03/03/2020 7:48 AM Performed by: Verdie Drown, CRNA Pre-anesthesia Checklist: Patient identified, Emergency Drugs available, Suction available and Patient being monitored Patient Re-evaluated:Patient Re-evaluated prior to induction Oxygen Delivery Method: Circle System Utilized Preoxygenation: Pre-oxygenation with 100% oxygen Induction Type: IV induction Ventilation: Mask ventilation without difficulty and Oral airway inserted - appropriate to patient size Laryngoscope Size: Mac and 3 Grade View: Grade I Tube type: Oral Tube size: 8.0 mm Number of attempts: 1 (Intubation per Dr Williams Che) Airway Equipment and Method: Stylet and Oral airway Placement Confirmation: ETT inserted through vocal cords under direct vision,  positive ETCO2 and breath sounds checked- equal and bilateral Secured at: 22 cm Tube secured with: Tape Dental Injury: Teeth and Oropharynx as per pre-operative assessment and Injury to lip

## 2020-03-03 NOTE — Anesthesia Procedure Notes (Signed)
Central Venous Catheter Insertion Performed by: Catalina Gravel, MD, anesthesiologist Start/End7/10/2019 7:15 AM, 03/03/2020 7:20 AM Patient location: Pre-op. Preanesthetic checklist: patient identified, IV checked, site marked, risks and benefits discussed, surgical consent, monitors and equipment checked, pre-op evaluation and timeout performed Position: Trendelenburg Hand hygiene performed  and maximum sterile barriers used  Total catheter length 100. PA cath was placed.[place]wan type:thermodilution PA Cath depth:65 Procedure performed without using ultrasound guided technique. Attempts: 1 Patient tolerated the procedure well with no immediate complications.

## 2020-03-03 NOTE — Progress Notes (Signed)
Patient arrived to OR alert and oriented. Patient interviewed in preop. Patient able to confirm name, DOB, procedure, NDKA, no metal in body, npo status and no pain at this time. Patient able to move over to OR table with minimal assistance.   Leatha Gilding, RN

## 2020-03-03 NOTE — Anesthesia Procedure Notes (Signed)
Arterial Line Insertion Start/End7/10/2019 7:00 AM, 03/03/2020 7:05 AM Performed by: Verdie Drown, CRNA, CRNA  Patient location: Pre-op. Preanesthetic checklist: patient identified, IV checked, site marked, risks and benefits discussed, surgical consent, monitors and equipment checked, pre-op evaluation, timeout performed and anesthesia consent Lidocaine 1% used for infiltration and patient sedated Left, radial was placed Catheter size: 20 G Hand hygiene performed  and maximum sterile barriers used   Attempts: 1 Procedure performed without using ultrasound guided technique. Following insertion, dressing applied and Biopatch. Post procedure assessment: normal  Patient tolerated the procedure well with no immediate complications.

## 2020-03-03 NOTE — Transfer of Care (Signed)
Immediate Anesthesia Transfer of Care Note  Patient: Curtis Maxwell  Procedure(s) Performed: AORTIC VALVE REPLACEMENT (AVR) Inspiris 53m (N/A Chest) CORONARY ARTERY BYPASS GRAFTING (CABG) x3. Endoscopic saphenous vein harvest   (N/A Chest) TRANSESOPHAGEAL ECHOCARDIOGRAM (TEE) (N/A Esophagus) FLEXIBLE BRONCHOSCOPY (N/A Bronchus)  Patient Location: SICU  Anesthesia Type:General  Level of Consciousness: Patient remains intubated per anesthesia plan  Airway & Oxygen Therapy: Patient remains intubated per anesthesia plan and Patient placed on Ventilator (see vital sign flow sheet for setting)  Post-op Assessment: Report given to RN and Post -op Vital signs reviewed and stable  Post vital signs: Reviewed and stable  Last Vitals:  Vitals Value Taken Time  BP 86/61 03/03/20 1638  Temp    Pulse 89 03/03/20 1646  Resp 15 03/03/20 1646  SpO2 99 % 03/03/20 1646  Vitals shown include unvalidated device data.  Last Pain:  Vitals:   03/03/20 0609  TempSrc:   PainSc: 0-No pain         Complications: No complications documented.

## 2020-03-03 NOTE — Brief Op Note (Signed)
03/03/2020  10:52 AM  PATIENT:  Curtis Maxwell  59 y.o. male  PRE-OPERATIVE DIAGNOSIS:  Aortic Stenosis, Coronary Artery Disease  POST-OPERATIVE DIAGNOSIS:  * No post-op diagnosis entered *  PROCEDURE:  Procedure(s): AORTIC VALVE REPLACEMENT (AVR) Inspiris 82m (N/A) CORONARY ARTERY BYPASS GRAFTING (CABG) x3. Endoscopic saphenous vein harvest   (N/A) TRANSESOPHAGEAL ECHOCARDIOGRAM (TEE) (N/A) FLEXIBLE BRONCHOSCOPY (N/A)   LIMA to OM1 SVG to RCA SVG to distal circumflex  SURGEON:  Surgeon(s) and Role:    *Ivin Poot MD - Primary  PHYSICIAN ASSISTANT:  TNicholes Rough PA-C   ANESTHESIA:   general  EBL:  510 mL   BLOOD ADMINISTERED:1 UNIT OF PRBC  DRAINS: ROUTINE   LOCAL MEDICATIONS USED:  NONE  SPECIMEN:  Source of Specimen:  AORTIC VALVE LEAFLETS  DISPOSITION OF SPECIMEN:  PATHOLOGY  COUNTS:  YES   DICTATION: .Dragon Dictation  PLAN OF CARE: Admit to inpatient   PATIENT DISPOSITION:  ICU - intubated and hemodynamically stable.   Delay start of Pharmacological VTE agent (>24hrs) due to surgical blood loss or risk of bleeding: yes

## 2020-03-04 ENCOUNTER — Inpatient Hospital Stay (HOSPITAL_COMMUNITY): Payer: Commercial Managed Care - PPO

## 2020-03-04 LAB — POCT I-STAT 7, (LYTES, BLD GAS, ICA,H+H)
Acid-Base Excess: 0 mmol/L (ref 0.0–2.0)
Acid-base deficit: 1 mmol/L (ref 0.0–2.0)
Acid-base deficit: 2 mmol/L (ref 0.0–2.0)
Acid-base deficit: 3 mmol/L — ABNORMAL HIGH (ref 0.0–2.0)
Bicarbonate: 24.9 mmol/L (ref 20.0–28.0)
Bicarbonate: 24.5 mmol/L (ref 20.0–28.0)
Bicarbonate: 23.1 mmol/L (ref 20.0–28.0)
Bicarbonate: 21.3 mmol/L (ref 20.0–28.0)
Calcium, Ion: 1.19 mmol/L (ref 1.15–1.40)
Calcium, Ion: 1.19 mmol/L (ref 1.15–1.40)
Calcium, Ion: 1.16 mmol/L (ref 1.15–1.40)
Calcium, Ion: 1.13 mmol/L — ABNORMAL LOW (ref 1.15–1.40)
HCT: 22 % — ABNORMAL LOW (ref 39.0–52.0)
HCT: 23 % — ABNORMAL LOW (ref 39.0–52.0)
HCT: 28 % — ABNORMAL LOW (ref 39.0–52.0)
HCT: 27 % — ABNORMAL LOW (ref 39.0–52.0)
Hemoglobin: 7.5 g/dL — ABNORMAL LOW (ref 13.0–17.0)
Hemoglobin: 7.8 g/dL — ABNORMAL LOW (ref 13.0–17.0)
Hemoglobin: 9.5 g/dL — ABNORMAL LOW (ref 13.0–17.0)
Hemoglobin: 9.2 g/dL — ABNORMAL LOW (ref 13.0–17.0)
O2 Saturation: 99 %
O2 Saturation: 92 %
O2 Saturation: 91 %
O2 Saturation: 86 %
Patient temperature: 36.7
Patient temperature: 37.5
Patient temperature: 38.1
Patient temperature: 38.6
Potassium: 4.3 mmol/L (ref 3.5–5.1)
Potassium: 4.2 mmol/L (ref 3.5–5.1)
Potassium: 4.4 mmol/L (ref 3.5–5.1)
Potassium: 4.3 mmol/L (ref 3.5–5.1)
Sodium: 141 mmol/L (ref 135–145)
Sodium: 141 mmol/L (ref 135–145)
Sodium: 142 mmol/L (ref 135–145)
Sodium: 138 mmol/L (ref 135–145)
TCO2: 26 mmol/L (ref 22–32)
TCO2: 26 mmol/L (ref 22–32)
TCO2: 24 mmol/L (ref 22–32)
TCO2: 22 mmol/L (ref 22–32)
pCO2 arterial: 42.5 mmHg (ref 32.0–48.0)
pCO2 arterial: 43.8 mmHg (ref 32.0–48.0)
pCO2 arterial: 39.5 mmHg (ref 32.0–48.0)
pCO2 arterial: 37.5 mmHg (ref 32.0–48.0)
pH, Arterial: 7.374 (ref 7.350–7.450)
pH, Arterial: 7.357 (ref 7.350–7.450)
pH, Arterial: 7.379 (ref 7.350–7.450)
pH, Arterial: 7.368 (ref 7.350–7.450)
pO2, Arterial: 127 mmHg — ABNORMAL HIGH (ref 83.0–108.0)
pO2, Arterial: 67 mmHg — ABNORMAL LOW (ref 83.0–108.0)
pO2, Arterial: 66 mmHg — ABNORMAL LOW (ref 83.0–108.0)
pO2, Arterial: 57 mmHg — ABNORMAL LOW (ref 83.0–108.0)

## 2020-03-04 LAB — BPAM CRYOPRECIPITATE
Blood Product Expiration Date: 202107022020
Blood Product Expiration Date: 202107022020
ISSUE DATE / TIME: 202107021452
ISSUE DATE / TIME: 202107021452
Unit Type and Rh: 5100
Unit Type and Rh: 5100

## 2020-03-04 LAB — PREPARE CRYOPRECIPITATE
Unit division: 0
Unit division: 0

## 2020-03-04 LAB — PREPARE FRESH FROZEN PLASMA
Unit division: 0
Unit division: 0

## 2020-03-04 LAB — GLUCOSE, CAPILLARY
Glucose-Capillary: 108 mg/dL — ABNORMAL HIGH (ref 70–99)
Glucose-Capillary: 115 mg/dL — ABNORMAL HIGH (ref 70–99)
Glucose-Capillary: 115 mg/dL — ABNORMAL HIGH (ref 70–99)
Glucose-Capillary: 115 mg/dL — ABNORMAL HIGH (ref 70–99)
Glucose-Capillary: 115 mg/dL — ABNORMAL HIGH (ref 70–99)
Glucose-Capillary: 117 mg/dL — ABNORMAL HIGH (ref 70–99)
Glucose-Capillary: 119 mg/dL — ABNORMAL HIGH (ref 70–99)
Glucose-Capillary: 122 mg/dL — ABNORMAL HIGH (ref 70–99)
Glucose-Capillary: 124 mg/dL — ABNORMAL HIGH (ref 70–99)
Glucose-Capillary: 124 mg/dL — ABNORMAL HIGH (ref 70–99)
Glucose-Capillary: 125 mg/dL — ABNORMAL HIGH (ref 70–99)
Glucose-Capillary: 127 mg/dL — ABNORMAL HIGH (ref 70–99)
Glucose-Capillary: 148 mg/dL — ABNORMAL HIGH (ref 70–99)
Glucose-Capillary: 158 mg/dL — ABNORMAL HIGH (ref 70–99)

## 2020-03-04 LAB — BPAM FFP
Blood Product Expiration Date: 202107062359
Blood Product Expiration Date: 202107062359
Blood Product Expiration Date: 202107072359
Blood Product Expiration Date: 202107072359
Blood Product Expiration Date: 202107072359
Blood Product Expiration Date: 202107072359
ISSUE DATE / TIME: 202107021300
ISSUE DATE / TIME: 202107021300
ISSUE DATE / TIME: 202107021605
ISSUE DATE / TIME: 202107021605
Unit Type and Rh: 6200
Unit Type and Rh: 6200
Unit Type and Rh: 5100
Unit Type and Rh: 9500
Unit Type and Rh: 6200
Unit Type and Rh: 6200

## 2020-03-04 LAB — BASIC METABOLIC PANEL
Anion gap: 6 (ref 5–15)
Anion gap: 10 (ref 5–15)
BUN: 11 mg/dL (ref 6–20)
BUN: 11 mg/dL (ref 6–20)
CO2: 23 mmol/L (ref 22–32)
CO2: 20 mmol/L — ABNORMAL LOW (ref 22–32)
Calcium: 7.6 mg/dL — ABNORMAL LOW (ref 8.9–10.3)
Calcium: 8 mg/dL — ABNORMAL LOW (ref 8.9–10.3)
Chloride: 110 mmol/L (ref 98–111)
Chloride: 107 mmol/L (ref 98–111)
Creatinine, Ser: 0.91 mg/dL (ref 0.61–1.24)
Creatinine, Ser: 1.18 mg/dL (ref 0.61–1.24)
GFR calc Af Amer: >60 mL/min (ref >60)
GFR calc Af Amer: >60 mL/min (ref >60)
GFR calc non Af Amer: >60 mL/min (ref >60)
GFR calc non Af Amer: >60 mL/min (ref >60)
Glucose, Bld: 120 mg/dL — ABNORMAL HIGH (ref 70–99)
Glucose, Bld: 155 mg/dL — ABNORMAL HIGH (ref 70–99)
Potassium: 4.3 mmol/L (ref 3.5–5.1)
Potassium: 4.2 mmol/L (ref 3.5–5.1)
Sodium: 139 mmol/L (ref 135–145)
Sodium: 137 mmol/L (ref 135–145)

## 2020-03-04 LAB — CBC
HCT: 23 % — ABNORMAL LOW (ref 39.0–52.0)
HCT: 28.3 % — ABNORMAL LOW (ref 39.0–52.0)
Hemoglobin: 7.3 g/dL — ABNORMAL LOW (ref 13.0–17.0)
Hemoglobin: 9 g/dL — ABNORMAL LOW (ref 13.0–17.0)
MCH: 28.6 pg (ref 26.0–34.0)
MCH: 29.1 pg (ref 26.0–34.0)
MCHC: 31.7 g/dL (ref 30.0–36.0)
MCHC: 31.8 g/dL (ref 30.0–36.0)
MCV: 90.2 fL (ref 80.0–100.0)
MCV: 91.6 fL (ref 80.0–100.0)
Platelets: 116 10*3/uL — ABNORMAL LOW (ref 150–400)
Platelets: 109 10*3/uL — ABNORMAL LOW (ref 150–400)
RBC: 2.55 MIL/uL — ABNORMAL LOW (ref 4.22–5.81)
RBC: 3.09 MIL/uL — ABNORMAL LOW (ref 4.22–5.81)
RDW: 13.9 % (ref 11.5–15.5)
RDW: 14.2 % (ref 11.5–15.5)
WBC: 8.8 10*3/uL (ref 4.0–10.5)
WBC: 13.6 10*3/uL — ABNORMAL HIGH (ref 4.0–10.5)
nRBC: 0 % (ref 0.0–0.2)
nRBC: 0 % (ref 0.0–0.2)

## 2020-03-04 LAB — COOXEMETRY PANEL
Carboxyhemoglobin: 0.9 % (ref 0.5–1.5)
Methemoglobin: 0.9 % (ref 0.0–1.5)
O2 Saturation: 62.8 %
Total hemoglobin: 8.5 g/dL — ABNORMAL LOW (ref 12.0–16.0)

## 2020-03-04 LAB — MAGNESIUM
Magnesium: 2.4 mg/dL (ref 1.7–2.4)
Magnesium: 2.1 mg/dL (ref 1.7–2.4)

## 2020-03-04 LAB — PREPARE PLATELET PHERESIS
Unit division: 0
Unit division: 0

## 2020-03-04 LAB — BPAM PLATELET PHERESIS
Blood Product Expiration Date: 202107032359
Blood Product Expiration Date: 202107032359
ISSUE DATE / TIME: 202107021300
ISSUE DATE / TIME: 202107021637
Unit Type and Rh: 6200
Unit Type and Rh: 6200

## 2020-03-04 LAB — PREPARE RBC (CROSSMATCH)

## 2020-03-04 MED ORDER — FUROSEMIDE 10 MG/ML IJ SOLN
20.0000 mg | Freq: Two times a day (BID) | INTRAMUSCULAR | Status: DC
  Administered 2020-03-04: 20 mg via INTRAVENOUS
  Filled 2020-03-04: qty 2

## 2020-03-04 MED ORDER — INSULIN DETEMIR 100 UNIT/ML ~~LOC~~ SOLN
5.0000 [IU] | Freq: Two times a day (BID) | SUBCUTANEOUS | Status: DC
  Administered 2020-03-04 – 2020-03-06 (×5): 5 [IU] via SUBCUTANEOUS
  Filled 2020-03-04 (×7): qty 0.05

## 2020-03-04 MED ORDER — FENTANYL CITRATE (PF) 100 MCG/2ML IJ SOLN
INTRAMUSCULAR | Status: AC
  Filled 2020-03-04: qty 2

## 2020-03-04 MED ORDER — FENTANYL CITRATE (PF) 100 MCG/2ML IJ SOLN
50.0000 ug | INTRAMUSCULAR | Status: DC | PRN
  Administered 2020-03-04 – 2020-03-05 (×4): 50 ug via INTRAVENOUS
  Filled 2020-03-04 (×3): qty 2

## 2020-03-04 MED ORDER — ORAL CARE MOUTH RINSE
15.0000 mL | Freq: Two times a day (BID) | OROMUCOSAL | Status: DC
  Administered 2020-03-04 – 2020-03-11 (×12): 15 mL via OROMUCOSAL

## 2020-03-04 MED ORDER — SIMETHICONE 80 MG PO CHEW
80.0000 mg | CHEWABLE_TABLET | Freq: Four times a day (QID) | ORAL | Status: DC
  Administered 2020-03-04 – 2020-03-12 (×32): 80 mg via ORAL
  Filled 2020-03-04 (×32): qty 1

## 2020-03-04 MED ORDER — CHLORHEXIDINE GLUCONATE 0.12 % MT SOLN
15.0000 mL | Freq: Two times a day (BID) | OROMUCOSAL | Status: DC
  Administered 2020-03-04 – 2020-03-12 (×15): 15 mL via OROMUCOSAL
  Filled 2020-03-04 (×14): qty 15

## 2020-03-04 MED ORDER — FUROSEMIDE 10 MG/ML IJ SOLN
40.0000 mg | Freq: Two times a day (BID) | INTRAMUSCULAR | Status: DC
  Administered 2020-03-04 – 2020-03-05 (×3): 40 mg via INTRAVENOUS
  Filled 2020-03-04 (×4): qty 4

## 2020-03-04 MED ORDER — LEVALBUTEROL HCL 1.25 MG/0.5ML IN NEBU
1.2500 mg | INHALATION_SOLUTION | Freq: Four times a day (QID) | RESPIRATORY_TRACT | Status: DC | PRN
  Administered 2020-03-05 – 2020-03-09 (×2): 1.25 mg via RESPIRATORY_TRACT
  Filled 2020-03-04 (×2): qty 0.5

## 2020-03-04 MED ORDER — INSULIN ASPART 100 UNIT/ML ~~LOC~~ SOLN: 2.0000 [IU] | SUBCUTANEOUS | Status: DC

## 2020-03-04 MED ORDER — METOCLOPRAMIDE HCL 5 MG/ML IJ SOLN
10.0000 mg | Freq: Four times a day (QID) | INTRAMUSCULAR | Status: DC
  Administered 2020-03-04 – 2020-03-05 (×6): 10 mg via INTRAVENOUS
  Filled 2020-03-04 (×6): qty 2

## 2020-03-04 MED ORDER — INSULIN ASPART 100 UNIT/ML ~~LOC~~ SOLN
0.0000 [IU] | SUBCUTANEOUS | Status: DC
  Administered 2020-03-04 – 2020-03-05 (×3): 2 [IU] via SUBCUTANEOUS
  Administered 2020-03-05: 4 [IU] via SUBCUTANEOUS
  Administered 2020-03-05 – 2020-03-06 (×3): 2 [IU] via SUBCUTANEOUS

## 2020-03-04 MED ORDER — GUAIFENESIN ER 600 MG PO TB12
600.0000 mg | ORAL_TABLET | Freq: Two times a day (BID) | ORAL | Status: DC
  Administered 2020-03-04 – 2020-03-10 (×13): 600 mg via ORAL
  Filled 2020-03-04 (×13): qty 1

## 2020-03-04 MED ORDER — INSULIN DETEMIR 100 UNIT/ML ~~LOC~~ SOLN
5.0000 [IU] | Freq: Two times a day (BID) | SUBCUTANEOUS | Status: DC
  Administered 2020-03-04: 5 [IU] via SUBCUTANEOUS
  Filled 2020-03-04 (×2): qty 0.05

## 2020-03-04 MED ORDER — CARVEDILOL 3.125 MG PO TABS
3.1250 mg | ORAL_TABLET | Freq: Two times a day (BID) | ORAL | Status: DC
  Administered 2020-03-04 – 2020-03-06 (×4): 3.125 mg via ORAL
  Filled 2020-03-04 (×4): qty 1

## 2020-03-04 NOTE — Op Note (Signed)
NAME: KACYN, SOUDER MEDICAL RECORD FG:[id number] ACCOUNT [id number] DATE OF BIRTH:[birth year] FACILITY: [facility] LOCATION: [facility] PHYSICIAN:Kalei Mckillop VAN TRIGT III, MD  OPERATIVE REPORT  DATE OF PROCEDURE:  03/03/2020  OPERATION: 1.  Aortic valve replacement with an Inspiris Edwards pericardial tissue valve 21 mm, serial #[id number]. 2.  Coronary artery bypass grafting x3 using left internal mammary artery to OM1, saphenous vein graft to distal circumflex, saphenous vein graft to RCA.   3.  Endoscopic harvest of right leg greater saphenous vein.   4.  Video bronchoscopy for airway secretions and subsegmental right lower lobe atelectasis on postoperative chest x-ray in the OR.   SURGEON:  Ivin Poot, MD  ASSISTANT:  Shaaron Adler PA-C.  ANESTHESIA:  General by Dr. Janice Norrie.  PREOPERATIVE DIAGNOSES:  Severe aortic stenosis with preoperative syncope, left ventricular hypertrophy, severe multivessel coronary artery disease with previous drug-eluting stent to the proximal circumflex.  CLINICAL NOTE:  The patient is a 59 year old obese male followed at [facility] for coronary disease.  His last intervention was approximately 2-1/2 years ago.  He recently had an episode of syncope and a subsequent  echocardiogram showed his moderate aortic stenosis was now severe with a peak transvalvular gradient greater than 50 mmHg.  He underwent repeat cardiac catheterization, which showed chronically occluded RCA, proximal 90% ostial stenosis of the circumflex  with a patent drug-eluting stent distal to the stenosis and 70% stenosis of an OM1 with minimal disease of the LAD.  He was referred for aortic valve replacement and coronary bypass grafting.  I saw the patient and evaluated him for that operation in  the office and agreed with his cardiologist's recommendation for AVR-CABG as his best long-term therapy.  His ejection fraction was moderately reduced at 35-45%.  The patient  wished a bioprosthetic valve, which I thought was reasonable at age 74.  He  understood the details of surgery including the use of general anesthesia and cardiopulmonary bypass, the location of the surgical incisions, and the expected postoperative recovery.  He understood the risks of bleeding, blood transfusion, stroke,  infection, organ failure, and death.  He agreed to proceed with surgery under what I felt was an informed consent.  OPERATIVE FINDINGS: 1.  Heavily calcified bicuspid aortic valve successfully replaced with a 21 mm pericardial Inspiris valve showing good function on echo following separation from cardiopulmonary bypass. 2.  Severe coronary artery disease with suboptimal targets for grafting in the RCA and distal circumflex. 3.  Intraoperative coagulopathy with a baseline prolonged ACT and post-protamine coagulopathy requiring factor replacement with FFP, platelets and cryoprecipitate.  DESCRIPTION OF PROCEDURE:  The patient was brought from preop holding where informed consent was documented and the final issues were addressed with the patient.  He had had no more syncopal episodes since his office visit and his medical condition has  been stable.  The patient was placed supine on the operating table and general anesthesia was induced under invasive hemodynamic monitoring.  A transesophageal echo probe was placed.  The patient remained stable.  The echo confirmed the preoperative diagnosis of  severe aortic stenosis and moderate reduction in LV ejection fraction.  The patient was then prepped and draped as a sterile field.  A proper time-out was performed.  A sternal incision was made as the saphenous vein was harvested endoscopically from the  right leg.  The left internal mammary artery was harvested as a pedicle graft from its origin at the subclavian vessels.  It was a 1.5  mm vessel with good flow.  The sternal retractor was placed, and the pericardium was opened and suspended.    Pursestrings were placed in the ascending aorta and right atrium.  The coronaries were identified for grafting.  The mammary artery and vein grafts were prepared for the distal anastomoses and cardioplegia cannulas were placed both antegrade aortic and  retrograde cold blood cardioplegia.  A LV vent was placed via the right superior pulmonary vein.  The patient was cooled to 32 degrees, and the aortic crossclamp was applied.  1.5 liters of cold blood cardioplegia was delivered in split doses between the antegrade aortic and retrograde coronary sinus catheters and there was good cardioplegic arrest  with subtle temperature dropped less than 14 degrees.  Cardioplegia was delivered every 20 minutes.  First, the distal coronary anastomoses were performed.  The RCA was a codominant vessel totally occluded and 1.5 mm in diameter.  A reverse saphenous vein was sewn end-to-side with running 7-0 Prolene to the RCA marginal branch with running 7-0 Prolene  with good flow through the graft.  Cardioplegia was redosed.  The second distal anastomosis was the distal circumflex.  It was heavily calcified proximally and into the takeoff of the distal circumflex posterolateral branch.  The posterolateral circumflex was 1.5 mm vessel and a reverse saphenous vein was sewn  end-to-side with running good with a running 7-0 Prolene with good flow through the graft.  Cardioplegia was redosed.  The third distal anastomosis was the OM1 of the left circumflex, which had a proximal 75% stenosis.  The left IMA pedicle was brought through an opening in the left lateral pericardium and was brought down onto the circumflex marginal with running and  the anastomosis was reconstructed with running with running 8-0 Prolene.  The mammary bulldog was briefly and there was good flow through the graft and the bulldog was replaced and the pedicle secured to the epicardium.  Cardioplegia was redosed.  Attention was then directed to the  aortic valve.  A transverse aortotomy was performed.  The aortic valve was inspected.  It was heavily calcified and immobile.  It was carefully excised and the annulus was debrided of calcium with rongeurs.  It was  irrigated with copious amounts of cold saline.  The annulus was measured to a 21 mm sizer and a 21 mm Inspiris pericardial valve was selected.  The subannular 2-0 pledgeted Ethibond sutures were placed and then were placed in the sewing ring of the valve  and the valve was seated and sutures were tied.  The valve conformed to the annulus well and there was no obstruction of the coronary ostia.  The aortotomy was closed in layers using a running Prolene.  Cardioplegia was redosed.  Cross clamp was still in place, the 2 proximal vein anastomoses were placed in the ascending aorta with a 4.5 mm punch and running 6-0 Prolene.  Prior to tying down the final proximal anastomosis, air was vented from the coronaries with a dose of  retrograde warm blood cardioplegia.  The crossclamp was removed.  The heart resumed a spontaneous rhythm.  The vein grafts were de-aired and opened.  Each had good flow.  Hemostasis was documented at the proximal and distal anastomoses.  The aortotomy was hemostatic.  The cardioplegia cannulas were removed.  Temporary  pacing wires were applied.  The LV vent was removed.  The lungs were expanded.  Ventilator was resumed.  After being rewarmed and reperfused, he was weaned from cardiopulmonary bypass on  low-dose milrinone with acceptable hemodynamics.  Echo showed  improved LV function and the aortic valve was functioning normally.  Protamine was administered without adverse reaction.  However, there is still minimal clotting as would be expected after protamine.  For that reason, the patient received heparin since  he had been on extended course of Effient and FFP and then cryoprecipitate.  This improved the coagulopathy.  The superior pericardial fat was closed over the  aorta.  The anterior mediastinum and bilateral pleural tubes were placed and brought out through separate incisions.  The sternum was closed with #7 wire.  The pectoralis fascia was closed with running #1  Vicryl, and subcutaneous and skin layers were closed with running Vicryl.  The patient remained stable.  Total cardiopulmonary bypass time was 200 limits.  A chest x-ray was taken in the room, which showed atelectasis of the right lower lobe.  The patient had heavy airway secretions during the operation with lower than expected oxygenation, so for that reason, the patient underwent a bronchoscopy, which I  performed in the operating room and cleared thick mucus airway secretions from the right lower lobe.  A followup chest x-ray in the ICU, subsequently showed improvement in aeration of the right lower lobe.  PN/NUANCE  D:03/03/2020 T:03/04/2020 JOB:[id number]

## 2020-03-04 NOTE — Progress Notes (Signed)
1 Day Post-Op Procedure(s) (LRB): AORTIC VALVE REPLACEMENT (AVR) Inspiris 68m (N/A) CORONARY ARTERY BYPASS GRAFTING (CABG) x3. Endoscopic saphenous vein harvest   (N/A) TRANSESOPHAGEAL ECHOCARDIOGRAM (TEE) (N/A) FLEXIBLE BRONCHOSCOPY (N/A) Subjective: Extubated and up in chair Attaining sinus rhythm Weaned off pressors except for milrinone Objective: Vital signs in last 24 hours: Temp:  [97.2 F (36.2 C)-101.3 F (38.5 C)] 101.3 F (38.5 C) (07/03 1500) Pulse Rate:  [88-91] 90 (07/03 1500) Cardiac Rhythm: Atrial paced (07/03 1200) Resp:  [12-28] 18 (07/03 1500) BP: (86-171)/(55-77) 102/55 (07/03 1500) SpO2:  [89 %-100 %] 90 % (07/03 1500) Arterial Line BP: (98-270)/(11-77) 137/56 (07/03 1500) FiO2 (%):  [40 %-50 %] 40 % (07/03 0930) Weight:  [95 kg] 95 kg (07/03 0700)  Hemodynamic parameters for last 24 hours: PAP: (20-54)/(3-39) 43/26 CO:  [4 L/min-6.4 L/min] 6.4 L/min CI:  [1.9 L/min/m2-3.2 L/min/m2] 3.2 L/min/m2  Intake/Output from previous day: 07/02 0701 - 07/03 0700 In: 8391.9 [I.V.:3808.9; Blood:2851; NG/GT:180; IV PMBWGYKZLD:3570]Out: 41779 [TJQZE:0923 Emesis/NG output:250; Blood:510; Chest Tube:1360] Intake/Output this shift: Total I/O In: 956.2 [P.O.:120; I.V.:521.2; Blood:315] Out: 1640 [Urine:1030; Emesis/NG output:50; Chest Tube:560]        Exam    General- alert and comfortable    Neck- no JVD, no cervical adenopathy palpable, no carotid bruit   Lungs- clear without rales, wheezes   Cor- regular rate and rhythm, no murmur , gallop   Abdomen- soft, non-tender   Extremities - warm, non-tender, minimal edema   Neuro- oriented, appropriate, no focal weakness   Lab Results: Recent Labs    03/03/20 2144 03/03/20 2144 03/04/20 0340 03/04/20 0348 03/04/20 0954 03/04/20 1106  WBC 11.3*  --  8.8  --   --   --   HGB 8.1*   < > 7.3*   < > 7.8* 9.5*  HCT 25.3*   < > 23.0*   < > 23.0* 28.0*  PLT 125*  --  116*  --   --   --    < > = values in this  interval not displayed.   BMET:  Recent Labs    03/03/20 2144 03/03/20 2144 03/04/20 0340 03/04/20 0348 03/04/20 0954 03/04/20 1106  NA 139   < > 139   < > 141 142  K 3.7   < > 4.3   < > 4.2 4.4  CL 109  --  110  --   --   --   CO2 22  --  23  --   --   --   GLUCOSE 157*  --  120*  --   --   --   BUN 11  --  11  --   --   --   CREATININE 0.94  --  0.91  --   --   --   CALCIUM 7.5*  --  7.6*  --   --   --    < > = values in this interval not displayed.    PT/INR:  Recent Labs    03/03/20 2141  LABPROT 15.3*  INR 1.3*   ABG    Component Value Date/Time   PHART 7.379 03/04/2020 1106   HCO3 23.1 03/04/2020 1106   TCO2 24 03/04/2020 1106   ACIDBASEDEF 2.0 03/04/2020 1106   O2SAT 91.0 03/04/2020 1106   CBG (last 3)  Recent Labs    03/04/20 0952 03/04/20 1205 03/04/20 1336  GLUCAP 115* 148* 115*    Assessment/Plan: S/P Procedure(s) (LRB): AORTIC VALVE REPLACEMENT (AVR) Inspiris  5m (N/A) CORONARY ARTERY BYPASS GRAFTING (CABG) x3. Endoscopic saphenous vein harvest   (N/A) TRANSESOPHAGEAL ECHOCARDIOGRAM (TEE) (N/A) FLEXIBLE BRONCHOSCOPY (N/A) Mobilize Diuresis Diabetes control See progression orders Continue milrinone check coox in a.m.   LOS: 1 day    PTharon AquasTrigt III 03/04/2020

## 2020-03-04 NOTE — Procedures (Signed)
Extubation Procedure Note  Patient Details:   Name: Curtis Maxwell DOB: Jun 29, 1961 MRN: [id number]   Airway Documentation:    Vent end date: 03/04/20 Vent end time: 1000   Evaluation  O2 sats: stable throughout Complications: No apparent complications Patient did tolerate procedure well. Bilateral Breath Sounds: Clear, Diminished   Pt extubated per rapid wean protocol. Pt had + cuff leak NIF -22 VC 0.9L Pt able to voice  Placed on 6L [place] tolerating well   [name] 03/04/2020, 10:03 AM

## 2020-03-04 NOTE — Progress Notes (Signed)
CT surgery p.m. Rounds  Patient up in chair this evening a paced with blood pressure 100/70 6 L nasal cannula saturation 90% P.m. labs reviewed and are stable Continue with milrinone diuresis and pulmonary toilette

## 2020-03-04 NOTE — Progress Notes (Signed)
CPT through [place] started this evening. Pt with diminished bbs pre-treatment, and slightly improved aeration noted post treatment. No cough at this time, but pt states that he did have a very productive cough earlier today. RT Will continue to monitor.

## 2020-03-05 ENCOUNTER — Inpatient Hospital Stay (HOSPITAL_COMMUNITY): Payer: Commercial Managed Care - PPO

## 2020-03-05 ENCOUNTER — Inpatient Hospital Stay: Payer: Self-pay

## 2020-03-05 LAB — BASIC METABOLIC PANEL
Anion gap: 8 (ref 5–15)
Anion gap: 11 (ref 5–15)
BUN: 12 mg/dL (ref 6–20)
BUN: 14 mg/dL (ref 6–20)
CO2: 23 mmol/L (ref 22–32)
CO2: 22 mmol/L (ref 22–32)
Calcium: 8.1 mg/dL — ABNORMAL LOW (ref 8.9–10.3)
Calcium: 8.5 mg/dL — ABNORMAL LOW (ref 8.9–10.3)
Chloride: 103 mmol/L (ref 98–111)
Chloride: 99 mmol/L (ref 98–111)
Creatinine, Ser: 1.17 mg/dL (ref 0.61–1.24)
Creatinine, Ser: 1.26 mg/dL — ABNORMAL HIGH (ref 0.61–1.24)
GFR calc Af Amer: >60 mL/min (ref >60)
GFR calc Af Amer: >60 mL/min (ref >60)
GFR calc non Af Amer: >60 mL/min (ref >60)
GFR calc non Af Amer: >60 mL/min (ref >60)
Glucose, Bld: 138 mg/dL — ABNORMAL HIGH (ref 70–99)
Glucose, Bld: 183 mg/dL — ABNORMAL HIGH (ref 70–99)
Potassium: 4.2 mmol/L (ref 3.5–5.1)
Potassium: 3.8 mmol/L (ref 3.5–5.1)
Sodium: 134 mmol/L — ABNORMAL LOW (ref 135–145)
Sodium: 132 mmol/L — ABNORMAL LOW (ref 135–145)

## 2020-03-05 LAB — CBC
HCT: 25.3 % — ABNORMAL LOW (ref 39.0–52.0)
HCT: 25.9 % — ABNORMAL LOW (ref 39.0–52.0)
Hemoglobin: 8 g/dL — ABNORMAL LOW (ref 13.0–17.0)
Hemoglobin: 8.1 g/dL — ABNORMAL LOW (ref 13.0–17.0)
MCH: 29.2 pg (ref 26.0–34.0)
MCH: 29.2 pg (ref 26.0–34.0)
MCHC: 31.6 g/dL (ref 30.0–36.0)
MCHC: 31.3 g/dL (ref 30.0–36.0)
MCV: 92.3 fL (ref 80.0–100.0)
MCV: 93.5 fL (ref 80.0–100.0)
Platelets: 102 10*3/uL — ABNORMAL LOW (ref 150–400)
Platelets: 111 10*3/uL — ABNORMAL LOW (ref 150–400)
RBC: 2.74 MIL/uL — ABNORMAL LOW (ref 4.22–5.81)
RBC: 2.77 MIL/uL — ABNORMAL LOW (ref 4.22–5.81)
RDW: 14.4 % (ref 11.5–15.5)
RDW: 13.7 % (ref 11.5–15.5)
WBC: 15.6 10*3/uL — ABNORMAL HIGH (ref 4.0–10.5)
WBC: 18.8 10*3/uL — ABNORMAL HIGH (ref 4.0–10.5)
nRBC: 0 % (ref 0.0–0.2)
nRBC: 0.1 % (ref 0.0–0.2)

## 2020-03-05 LAB — BPAM RBC
Blood Product Expiration Date: 202108022359
Blood Product Expiration Date: 202108022359
Blood Product Expiration Date: 202108052359
ISSUE DATE / TIME: 202107021049
ISSUE DATE / TIME: 202107021049
ISSUE DATE / TIME: 202107030913
Unit Type and Rh: 5100
Unit Type and Rh: 5100
Unit Type and Rh: 5100

## 2020-03-05 LAB — TYPE AND SCREEN
ABO/RH(D): O POS
Antibody Screen: NEGATIVE
Unit division: 0
Unit division: 0
Unit division: 0

## 2020-03-05 LAB — COOXEMETRY PANEL
Carboxyhemoglobin: 1.2 % (ref 0.5–1.5)
Methemoglobin: 1.3 % (ref 0.0–1.5)
O2 Saturation: 68.7 %
Total hemoglobin: 8.3 g/dL — ABNORMAL LOW (ref 12.0–16.0)

## 2020-03-05 LAB — GLUCOSE, CAPILLARY
Glucose-Capillary: 116 mg/dL — ABNORMAL HIGH (ref 70–99)
Glucose-Capillary: 118 mg/dL — ABNORMAL HIGH (ref 70–99)
Glucose-Capillary: 127 mg/dL — ABNORMAL HIGH (ref 70–99)
Glucose-Capillary: 133 mg/dL — ABNORMAL HIGH (ref 70–99)
Glucose-Capillary: 152 mg/dL — ABNORMAL HIGH (ref 70–99)
Glucose-Capillary: 152 mg/dL — ABNORMAL HIGH (ref 70–99)

## 2020-03-05 MED ORDER — POTASSIUM CHLORIDE 10 MEQ/50ML IV SOLN
10.0000 meq | INTRAVENOUS | Status: AC
  Administered 2020-03-05 (×2): 10 meq via INTRAVENOUS
  Filled 2020-03-05 (×2): qty 50

## 2020-03-05 MED ORDER — SODIUM CHLORIDE 0.9 % IV SOLN: 25.0000 mg | Freq: Once | INTRAVENOUS | Status: DC

## 2020-03-05 MED ORDER — LEVALBUTEROL HCL 1.25 MG/0.5ML IN NEBU
1.2500 mg | INHALATION_SOLUTION | Freq: Four times a day (QID) | RESPIRATORY_TRACT | Status: DC
  Administered 2020-03-06 (×3): 1.25 mg via RESPIRATORY_TRACT
  Filled 2020-03-05 (×3): qty 0.5

## 2020-03-05 MED ORDER — OXYCODONE HCL 5 MG PO TABS
5.0000 mg | ORAL_TABLET | ORAL | Status: DC | PRN
  Administered 2020-03-05 – 2020-03-12 (×13): 5 mg via ORAL
  Filled 2020-03-05 (×13): qty 1

## 2020-03-05 MED ORDER — FENTANYL CITRATE (PF) 100 MCG/2ML IJ SOLN: 50.0000 ug | INTRAMUSCULAR | Status: DC | PRN

## 2020-03-05 MED ORDER — FUROSEMIDE 10 MG/ML IJ SOLN
8.0000 mg/h | INTRAVENOUS | Status: DC
  Administered 2020-03-05 (×2): 10 mg/h via INTRAVENOUS
  Administered 2020-03-06: 8 mg/h via INTRAVENOUS
  Filled 2020-03-05: qty 20
  Filled 2020-03-05 (×2): qty 25

## 2020-03-05 MED ORDER — METOCLOPRAMIDE HCL 5 MG/ML IJ SOLN
10.0000 mg | Freq: Four times a day (QID) | INTRAMUSCULAR | Status: DC
  Administered 2020-03-05 – 2020-03-08 (×10): 10 mg via INTRAVENOUS
  Filled 2020-03-05 (×10): qty 2

## 2020-03-05 MED ORDER — ACETYLCYSTEINE 20 % IN SOLN
4.0000 mL | Freq: Four times a day (QID) | RESPIRATORY_TRACT | Status: AC
  Administered 2020-03-06 – 2020-03-07 (×4): 4 mL via RESPIRATORY_TRACT
  Filled 2020-03-05 (×6): qty 4

## 2020-03-05 MED ORDER — SODIUM CHLORIDE 0.9 % IV SOLN
2.0000 g | INTRAVENOUS | Status: DC
  Administered 2020-03-05 – 2020-03-11 (×7): 2 g via INTRAVENOUS
  Filled 2020-03-05 (×10): qty 20

## 2020-03-05 MED ORDER — SODIUM CHLORIDE 0.9 % IV SOLN
510.0000 mg | Freq: Once | INTRAVENOUS | Status: AC
  Administered 2020-03-05: 510 mg via INTRAVENOUS
  Filled 2020-03-05: qty 17

## 2020-03-05 NOTE — Progress Notes (Signed)
Overnight, pt with SpO2 in the 80's and holding, despite O2 being increased to 10L. Pt with clear/diminished bbs, but extremely diminished on L. Pt was instructed on how to use flutter valve, and did it about 7 times as well as IS (also about 7 times), but only achieved around 500-746mL. Pt was also given neb tx, but no change in SpO2 or bbs. Pt placed on BIPAP 10/5, BUR 13, 90% around 0330. Pt appears to be tolerating BIPAP at this time, and resting comfortably. SpO2 currently 97%. RT will continue to monitor.

## 2020-03-05 NOTE — Progress Notes (Signed)
CT surgery p.m. Rounds  Patient maintaining sinus rhythm, atrially paced Cardiac output satisfactory on low-dose milrinone Still requiring BiPAP for adequate oxygenation but with diuresis, mobilization, and chest physiotherapy his oxygen requirements are stable. Most recent chest x-ray after PICC line shows improved lung volumes.  P.m. labs checked and are satisfactory. Because of heavy airway secretions and rising white count he was started on ceftriaxone for possible bronchopneumonia.  Chest tubes continue to drain serosanguineous fluid, hemoglobin stable 8.1.

## 2020-03-05 NOTE — Progress Notes (Signed)
2 Days Post-Op Procedure(s) (LRB): AORTIC VALVE REPLACEMENT (AVR) Inspiris 63m (N/A) CORONARY ARTERY BYPASS GRAFTING (CABG) x3. Endoscopic saphenous vein harvest   (N/A) TRANSESOPHAGEAL ECHOCARDIOGRAM (TEE) (N/A) FLEXIBLE BRONCHOSCOPY (N/A) Subjective: On BiPap overnight to maintain sats CXR with low lung volumes- big belly Cardiac output good on milrinone Will start lasix drip to help oxygenation now BP normal  Objective: Vital signs in last 24 hours: Temp:  [98.5 F (36.9 C)-101.7 F (38.7 C)] 98.9 F (37.2 C) (07/04 0735) Pulse Rate:  [87-92] 88 (07/04 0900) Cardiac Rhythm: Atrial paced (07/04 0800) Resp:  [10-29] 19 (07/04 0900) BP: (98-143)/(51-78) 127/70 (07/04 0900) SpO2:  [74 %-100 %] 97 % (07/04 0900) Arterial Line BP: (114-178)/(44-79) 148/57 (07/03 1700) FiO2 (%):  [80 %-90 %] 90 % (07/04 0838) Weight:  [93.1 kg] 93.1 kg (07/04 0600)  Hemodynamic parameters for last 24 hours: PAP: (25-50)/(9-30) 39/26 CVP:  [13 mmHg-20 mmHg] 13 mmHg CO:  [6.4 L/min] 6.4 L/min CI:  [3.2 L/min/m2] 3.2 L/min/m2  Intake/Output from previous day: 07/03 0701 - 07/04 0700 In: 1944.5 [P.O.:720; I.V.:909.5; Blood:315] Out: 3600 [Urine:2260; Emesis/NG output:50; Chest Tube:1290] Intake/Output this shift: Total I/O In: 158 [P.O.:100; I.V.:58] Out: 400 [Urine:365; Chest Tube:35]       Exam    General- alert and comfortable    Neck- no JVD, no cervical adenopathy palpable, no carotid bruit   Lungs- clear without rales, wheezes   Cor- regular rate and rhythm, no murmur , gallop   Abdomen- soft, non-tender   Extremities - warm, non-tender, minimal edema   Neuro- oriented, appropriate, no focal weakness   Lab Results: Recent Labs    03/04/20 1648 03/04/20 1648 03/04/20 1650 03/05/20 0401  WBC 13.6*  --   --  15.6*  HGB 9.0*   < > 9.2* 8.0*  HCT 28.3*   < > 27.0* 25.3*  PLT 109*  --   --  102*   < > = values in this interval not displayed.   BMET:  Recent Labs     03/04/20 1648 03/04/20 1648 03/04/20 1650 03/05/20 0401  NA 137   < > 138 134*  K 4.2   < > 4.3 4.2  CL 107  --   --  103  CO2 20*  --   --  23  GLUCOSE 155*  --   --  138*  BUN 11  --   --  12  CREATININE 1.18  --   --  1.17  CALCIUM 8.0*  --   --  8.1*   < > = values in this interval not displayed.    PT/INR:  Recent Labs    03/03/20 2141  LABPROT 15.3*  INR 1.3*   ABG    Component Value Date/Time   PHART 7.368 03/04/2020 1650   HCO3 21.3 03/04/2020 1650   TCO2 22 03/04/2020 1650   ACIDBASEDEF 3.0 (H) 03/04/2020 1650   O2SAT 68.7 03/05/2020 0401   CBG (last 3)  Recent Labs    03/05/20 0008 03/05/20 0356 03/05/20 0656  GLUCAP 118* 127* 152*    Assessment/Plan: S/P Procedure(s) (LRB): AORTIC VALVE REPLACEMENT (AVR) Inspiris 249m(N/A) CORONARY ARTERY BYPASS GRAFTING (CABG) x3. Endoscopic saphenous vein harvest   (N/A) TRANSESOPHAGEAL ECHOCARDIOGRAM (TEE) (N/A) FLEXIBLE BRONCHOSCOPY (N/A) Mobilize Diuresis Diabetes control metyaneb pulmonary physio-therapy   LOS: 2 days    Curtis Maxwell 03/05/2020

## 2020-03-05 NOTE — Progress Notes (Signed)
Peripherally Inserted Central Catheter Placement  The IV Nurse has discussed with the patient and/or persons authorized to consent for the patient, the purpose of this procedure and the potential benefits and risks involved with this procedure.  The benefits include less needle sticks, lab draws from the catheter, and the patient may be discharged home with the catheter. Risks include, but not limited to, infection, bleeding, blood clot (thrombus formation), and puncture of an artery; nerve damage and irregular heartbeat and possibility to perform a PICC exchange if needed/ordered by physician.  Alternatives to this procedure were also discussed.  Bard Power PICC patient education guide, fact sheet on infection prevention and patient information card has been provided to patient /or left at bedside.    PICC Placement Documentation  PICC Double Lumen 03/05/20 PICC Right Brachial 39 cm 0 cm (Active)  Indication for Insertion or Continuance of Line Vasoactive infusions 03/05/20 1630  Exposed Catheter (cm) 0 cm 03/05/20 1630  Site Assessment Clean;Dry;Intact 03/05/20 1630  Lumen #1 Status Flushed;Saline locked;Blood return noted 03/05/20 1630  Lumen #2 Status Flushed;Saline locked;Blood return noted 03/05/20 1630  Dressing Type Transparent 03/05/20 1630  Dressing Status Clean;Dry;Intact;Antimicrobial disc in place 03/05/20 1630  Safety Lock Not Applicable 38/88/28 0034  Dressing Intervention New dressing 03/05/20 1630  Dressing Change Due 03/12/20 03/05/20 1630       Gordan Payment 03/05/2020, 4:31 PM

## 2020-03-06 ENCOUNTER — Encounter (HOSPITAL_COMMUNITY): Payer: Self-pay | Admitting: Cardiothoracic Surgery

## 2020-03-06 ENCOUNTER — Inpatient Hospital Stay (HOSPITAL_COMMUNITY): Payer: Commercial Managed Care - PPO

## 2020-03-06 LAB — CBC
HCT: 25.1 % — ABNORMAL LOW (ref 39.0–52.0)
HCT: 23.9 % — ABNORMAL LOW (ref 39.0–52.0)
Hemoglobin: 7.8 g/dL — ABNORMAL LOW (ref 13.0–17.0)
Hemoglobin: 7.4 g/dL — ABNORMAL LOW (ref 13.0–17.0)
MCH: 28.5 pg (ref 26.0–34.0)
MCH: 28.2 pg (ref 26.0–34.0)
MCHC: 31.1 g/dL (ref 30.0–36.0)
MCHC: 31 g/dL (ref 30.0–36.0)
MCV: 91.6 fL (ref 80.0–100.0)
MCV: 91.2 fL (ref 80.0–100.0)
Platelets: 110 10*3/uL — ABNORMAL LOW (ref 150–400)
Platelets: 124 10*3/uL — ABNORMAL LOW (ref 150–400)
RBC: 2.74 MIL/uL — ABNORMAL LOW (ref 4.22–5.81)
RBC: 2.62 MIL/uL — ABNORMAL LOW (ref 4.22–5.81)
RDW: 13.6 % (ref 11.5–15.5)
RDW: 13.5 % (ref 11.5–15.5)
WBC: 18.6 10*3/uL — ABNORMAL HIGH (ref 4.0–10.5)
WBC: 17.9 10*3/uL — ABNORMAL HIGH (ref 4.0–10.5)
nRBC: 0.2 % (ref 0.0–0.2)
nRBC: 0.4 % — ABNORMAL HIGH (ref 0.0–0.2)

## 2020-03-06 LAB — BASIC METABOLIC PANEL
Anion gap: 9 (ref 5–15)
Anion gap: 11 (ref 5–15)
BUN: 13 mg/dL (ref 6–20)
BUN: 16 mg/dL (ref 6–20)
CO2: 28 mmol/L (ref 22–32)
CO2: 25 mmol/L (ref 22–32)
Calcium: 8.6 mg/dL — ABNORMAL LOW (ref 8.9–10.3)
Calcium: 8.2 mg/dL — ABNORMAL LOW (ref 8.9–10.3)
Chloride: 97 mmol/L — ABNORMAL LOW (ref 98–111)
Chloride: 93 mmol/L — ABNORMAL LOW (ref 98–111)
Creatinine, Ser: 1.12 mg/dL (ref 0.61–1.24)
Creatinine, Ser: 1.09 mg/dL (ref 0.61–1.24)
GFR calc Af Amer: >60 mL/min (ref >60)
GFR calc Af Amer: >60 mL/min (ref >60)
GFR calc non Af Amer: >60 mL/min (ref >60)
GFR calc non Af Amer: >60 mL/min (ref >60)
Glucose, Bld: 121 mg/dL — ABNORMAL HIGH (ref 70–99)
Glucose, Bld: 125 mg/dL — ABNORMAL HIGH (ref 70–99)
Potassium: 3.7 mmol/L (ref 3.5–5.1)
Potassium: 3.9 mmol/L (ref 3.5–5.1)
Sodium: 134 mmol/L — ABNORMAL LOW (ref 135–145)
Sodium: 129 mmol/L — ABNORMAL LOW (ref 135–145)

## 2020-03-06 LAB — GLUCOSE, CAPILLARY
Glucose-Capillary: 112 mg/dL — ABNORMAL HIGH (ref 70–99)
Glucose-Capillary: 114 mg/dL — ABNORMAL HIGH (ref 70–99)
Glucose-Capillary: 115 mg/dL — ABNORMAL HIGH (ref 70–99)
Glucose-Capillary: 127 mg/dL — ABNORMAL HIGH (ref 70–99)
Glucose-Capillary: 137 mg/dL — ABNORMAL HIGH (ref 70–99)
Glucose-Capillary: 153 mg/dL — ABNORMAL HIGH (ref 70–99)
Glucose-Capillary: 165 mg/dL — ABNORMAL HIGH (ref 70–99)

## 2020-03-06 LAB — COOXEMETRY PANEL
Carboxyhemoglobin: 0.8 % (ref 0.5–1.5)
Methemoglobin: 1.1 % (ref 0.0–1.5)
O2 Saturation: 58.3 %
Total hemoglobin: 10.7 g/dL — ABNORMAL LOW (ref 12.0–16.0)

## 2020-03-06 LAB — MAGNESIUM: Magnesium: 1.7 mg/dL (ref 1.7–2.4)

## 2020-03-06 MED ORDER — FUROSEMIDE 10 MG/ML IJ SOLN
20.0000 mg | Freq: Two times a day (BID) | INTRAMUSCULAR | Status: DC
  Administered 2020-03-07: 20 mg via INTRAVENOUS
  Filled 2020-03-06: qty 2

## 2020-03-06 MED ORDER — AMIODARONE HCL IN DEXTROSE 360-4.14 MG/200ML-% IV SOLN
60.0000 mg/h | INTRAVENOUS | Status: AC
  Administered 2020-03-06 (×2): 60 mg/h via INTRAVENOUS
  Filled 2020-03-06 (×2): qty 200

## 2020-03-06 MED ORDER — ENOXAPARIN SODIUM 40 MG/0.4ML ~~LOC~~ SOLN
40.0000 mg | SUBCUTANEOUS | Status: DC
  Administered 2020-03-06 – 2020-03-11 (×6): 40 mg via SUBCUTANEOUS
  Filled 2020-03-06 (×6): qty 0.4

## 2020-03-06 MED ORDER — AMIODARONE LOAD VIA INFUSION
150.0000 mg | Freq: Once | INTRAVENOUS | Status: AC
  Administered 2020-03-06: 150 mg via INTRAVENOUS
  Filled 2020-03-06: qty 83.34

## 2020-03-06 MED ORDER — POTASSIUM CHLORIDE CRYS ER 20 MEQ PO TBCR
20.0000 meq | EXTENDED_RELEASE_TABLET | ORAL | Status: AC
  Administered 2020-03-06 (×3): 20 meq via ORAL
  Filled 2020-03-06 (×3): qty 1

## 2020-03-06 MED ORDER — FE FUMARATE-B12-VIT C-FA-IFC PO CAPS
1.0000 | ORAL_CAPSULE | Freq: Three times a day (TID) | ORAL | Status: DC
  Administered 2020-03-06 – 2020-03-12 (×17): 1 via ORAL
  Filled 2020-03-06 (×17): qty 1

## 2020-03-06 MED ORDER — AMIODARONE HCL IN DEXTROSE 360-4.14 MG/200ML-% IV SOLN
30.0000 mg/h | INTRAVENOUS | Status: DC
  Administered 2020-03-06 – 2020-03-07 (×2): 30 mg/h via INTRAVENOUS
  Filled 2020-03-06 (×2): qty 200

## 2020-03-06 MED ORDER — SORBITOL 70 % SOLN
30.0000 mL | Freq: Once | Status: AC
  Administered 2020-03-06: 30 mL via ORAL
  Filled 2020-03-06: qty 30

## 2020-03-06 MED ORDER — LEVALBUTEROL HCL 0.63 MG/3ML IN NEBU
0.6300 mg | INHALATION_SOLUTION | Freq: Three times a day (TID) | RESPIRATORY_TRACT | Status: DC
  Administered 2020-03-06: 0.63 mg via RESPIRATORY_TRACT
  Filled 2020-03-06: qty 3

## 2020-03-06 MED ORDER — CARVEDILOL 6.25 MG PO TABS
6.2500 mg | ORAL_TABLET | Freq: Two times a day (BID) | ORAL | Status: DC
  Administered 2020-03-06: 6.25 mg via ORAL
  Administered 2020-03-06: 3.125 mg via ORAL
  Administered 2020-03-07 – 2020-03-08 (×4): 6.25 mg via ORAL
  Filled 2020-03-06 (×6): qty 1

## 2020-03-06 MED ORDER — POTASSIUM CHLORIDE 10 MEQ/50ML IV SOLN
10.0000 meq | INTRAVENOUS | Status: AC
  Administered 2020-03-06 (×2): 10 meq via INTRAVENOUS
  Filled 2020-03-06 (×2): qty 50

## 2020-03-06 MED ORDER — LEVALBUTEROL HCL 0.63 MG/3ML IN NEBU
0.6300 mg | INHALATION_SOLUTION | Freq: Four times a day (QID) | RESPIRATORY_TRACT | Status: DC
  Administered 2020-03-07 (×2): 0.63 mg via RESPIRATORY_TRACT
  Filled 2020-03-06 (×3): qty 3

## 2020-03-06 MED ORDER — INSULIN ASPART 100 UNIT/ML ~~LOC~~ SOLN
0.0000 [IU] | Freq: Three times a day (TID) | SUBCUTANEOUS | Status: DC
  Administered 2020-03-06: 4 [IU] via SUBCUTANEOUS
  Administered 2020-03-06 – 2020-03-08 (×8): 2 [IU] via SUBCUTANEOUS
  Administered 2020-03-09: 4 [IU] via SUBCUTANEOUS
  Administered 2020-03-10 (×3): 2 [IU] via SUBCUTANEOUS

## 2020-03-06 MED ORDER — MAGNESIUM SULFATE 2 GM/50ML IV SOLN
2.0000 g | Freq: Once | INTRAVENOUS | Status: AC
  Administered 2020-03-06: 2 g via INTRAVENOUS
  Filled 2020-03-06: qty 50

## 2020-03-06 NOTE — Progress Notes (Signed)
  Amiodarone Drug - Drug Interaction Consult Note  Recommendations: Simvastatin dose-adjusted already  Amiodarone is metabolized by the cytochrome P450 system and therefore has the potential to cause many drug interactions. Amiodarone has an average plasma half-life of 50 days (range 20 to 100 days).   There is potential for drug interactions to occur several weeks or months after stopping treatment and the onset of drug interactions may be slow after initiating amiodarone.   [x]  Statins: Increased risk of myopathy. Simvastatin- restrict dose to 20mg  daily. Other statins: counsel patients to report any muscle pain or weakness immediately.  []  Anticoagulants: Amiodarone can increase anticoagulant effect. Consider warfarin dose reduction. Patients should be monitored closely and the dose of anticoagulant altered accordingly, remembering that amiodarone levels take several weeks to stabilize.  []  Antiepileptics: Amiodarone can increase plasma concentration of phenytoin, the dose should be reduced. Note that small changes in phenytoin dose can result in large changes in levels. Monitor patient and counsel on signs of toxicity.  []  Beta blockers: increased risk of bradycardia, AV block and myocardial depression. Sotalol - avoid concomitant use.  []   Calcium channel blockers (diltiazem and verapamil): increased risk of bradycardia, AV block and myocardial depression.  []   Cyclosporine: Amiodarone increases levels of cyclosporine. Reduced dose of cyclosporine is recommended.  []  Digoxin dose should be halved when amiodarone is started.  []  Diuretics: increased risk of cardiotoxicity if hypokalemia occurs.  []  Oral hypoglycemic agents (glyburide, glipizide, glimepiride): increased risk of hypoglycemia. Patient's glucose levels should be monitored closely when initiating amiodarone therapy.   []  Drugs that prolong the QT interval:  Torsades de pointes risk may be increased with concurrent use -  avoid if possible.  Monitor QTc, also keep magnesium/potassium WNL if concurrent therapy can't be avoided. Marland Kitchen Antibiotics: e.g. fluoroquinolones, erythromycin. . Antiarrhythmics: e.g. quinidine, procainamide, disopyramide, sotalol. . Antipsychotics: e.g. phenothiazines, haloperidol.  . Lithium, tricyclic antidepressants, and methadone. Thank Concha Pyo  03/06/2020 10:12 AM

## 2020-03-06 NOTE — Progress Notes (Signed)
CT surgery p.m. Rounds  Patient converted to sinus rhythm on IV amiodarone, currently atrially paced  Oxygen saturation improved and patient now on high flow oxygen and off BiPAP.  He was able to get up to the chair twice today.  Blood pressure stable good urine output.  We will transition off Lasix drip.  Blood pressure 112/61, pulse 78, temperature 98.4 F (36.9 C), temperature source Oral, resp. rate 16, height 5\' 6"  (1.676 m), weight 90.7 kg, SpO2 100 %.

## 2020-03-06 NOTE — Progress Notes (Signed)
Pt sitting in chair unable to do cpt at this time

## 2020-03-06 NOTE — Progress Notes (Signed)
3 Days Post-Op Procedure(s) (LRB): AORTIC VALVE REPLACEMENT (AVR) Inspiris 49m (N/A) CORONARY ARTERY BYPASS GRAFTING (CABG) x3. Endoscopic saphenous vein harvest   (N/A) TRANSESOPHAGEAL ECHOCARDIOGRAM (TEE) (N/A) FLEXIBLE BRONCHOSCOPY (N/A) Subjective: Remains on BiPAP for adequate O2 sats Developed atrial fibrillation this a.m. now on amiodarone drip chst x-ray with improved aeration, chest tubes to be removed   Objective: Vital signs in last 24 hours: Temp:  [98.6 F (37 C)-100.5 F (38.1 C)] 98.9 F (37.2 C) (07/05 0722) Pulse Rate:  [67-149] 79 (07/05 1100) Cardiac Rhythm: Normal sinus rhythm (07/05 0800) Resp:  [13-29] 18 (07/05 1100) BP: (97-155)/(57-86) 139/71 (07/05 1100) SpO2:  [90 %-100 %] 93 % (07/05 1100) FiO2 (%):  [50 %-80 %] 50 % (07/05 0800) Weight:  [90.7 kg] 90.7 kg (07/05 0600)  Hemodynamic parameters for last 24 hours: CVP:  [16 mmHg-28 mmHg] 16 mmHg  Intake/Output from previous day: 07/04 0701 - 07/05 0700 In: 912.4 [P.O.:100; I.V.:495.4; IV Piggyback:317] Out: 49211[[HERDE:0814 Chest Tube:290] Intake/Output this shift: Total I/O In: 379.6 [I.V.:224.3; IV Piggyback:155.3] Out: 400 [Urine:350; Chest Tube:50]       Exam    General- alert and comfortable    Neck- no JVD, no cervical adenopathy palpable, no carotid bruit   Lungs- clear without rales, wheezes   Cor- regular rate and rhythm, no murmur , gallop   Abdomen- soft, non-tender   Extremities - warm, non-tender, minimal edema   Neuro- oriented, appropriate, no focal weakness   Lab Results: Recent Labs    03/05/20 1734 03/06/20 0319  WBC 18.8* 18.6*  HGB 8.1* 7.8*  HCT 25.9* 25.1*  PLT 111* 110*   BMET:  Recent Labs    03/05/20 1734 03/06/20 0319  NA 132* 134*  K 3.8 3.7  CL 99 97*  CO2 22 28  GLUCOSE 183* 121*  BUN 14 13  CREATININE 1.26* 1.12  CALCIUM 8.5* 8.6*    PT/INR:  Recent Labs    03/03/20 2141  LABPROT 15.3*  INR 1.3*   ABG    Component Value Date/Time    PHART 7.368 03/04/2020 1650   HCO3 21.3 03/04/2020 1650   TCO2 22 03/04/2020 1650   ACIDBASEDEF 3.0 (H) 03/04/2020 1650   O2SAT 58.3 03/06/2020 0319   CBG (last 3)  Recent Labs    03/06/20 0316 03/06/20 0630 03/06/20 1113  GLUCAP 127* 137* 153*    Assessment/Plan: S/P Procedure(s) (LRB): AORTIC VALVE REPLACEMENT (AVR) Inspiris 244m(N/A) CORONARY ARTERY BYPASS GRAFTING (CABG) x3. Endoscopic saphenous vein harvest   (N/A) TRANSESOPHAGEAL ECHOCARDIOGRAM (TEE) (N/A) FLEXIBLE BRONCHOSCOPY (N/A) Continue diuresis with Lasix drip Wean oxygen from BiPAP to high flow nasal cannula as tolerated Continue IV amiodarone for postop A. Fib Mobilize  as patient tolerates with BiPAP   LOS: 3 days    Curtis Maxwell 03/06/2020

## 2020-03-06 NOTE — Discharge Summary (Addendum)
Physician Discharge Summary        [address]       [place] [address]             [phone]      Patient ID: Curtis Maxwell MRN: [id number] DOB/AGE: [birth year] 59 y.o.  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  63 year old employed married male with type 2 diabetes and history of PCI 10 years ago presents after recent diagnosis of severe aortic stenosis with multivessel coronary artery disease.  He works at a [employer] and has had 2 episodes of dizziness with near syncope and 1 episode of dizziness with full syncopal episode.  An echocardiogram showed severe aortic stenosis with valve area of less than 0.7 and mean gradient of 45.  Echo shows LVH with good LV systolic function and no other significant valvular disease.  Coronary arteriography shows patent LAD with high-grade 90% proximal circumflex stenosis, 80% stenosis at the takeoff of the OM1, and chronic occlusion of the RCA with filling via collaterals.  LVEDP was 14 and EF normal.  Patient is referred for aortic valve replacement with combined coronary bypass grafting.   States he has had a cardiac murmur for most of his life.  He denies family history of cardiac valve disease.  He denies personal history of  rheumatic or scarlet fever as an adolescent.  He is right-hand dominant and denies any active dental complaints or chronic dental disease   He has been healthy and has worked continuously without hospitalization until being admitted thru the ED after his syncopal event.  Hospital Course:  As dictated by Nicholes Rough PA-C: Curtis Maxwell was admitted for elective surgery on 03/03/2020.  He was prepared and taken to the operating room where aortic valve replacement was accomplished using an Edwards InspirEase pericardial tissue valve, 21 mm.  Coronary bypass grafting x3 was also accomplished with the left internal mammary artery grafted to OM1 and saphenous vein was grafted to the distal circumflex and right coronary arteries.  Following the procedure, he was transferred to the cardiovascular ICU in stable condition.  He remained hemodynamically stable.  He was weaned from mechanical ventilation and extubated on the morning after surgery.  His respiratory status was marginal and he required BiPAP early postoperatively in order to maintain adequate oxygenation.  Chest tube drainage subsided and the tubes were removed routinely.  He developed atrial fibrillation on postop day 3 and was started on an amiodarone drip.  By this time, is chest x-ray was demonstrating improved aeration. Post-op day 4 he continued to progress. He was still needing high flow nasal cannula which we were weaning. We continued to encourage ambulation and use of incentive spirometer/pulm toilet. He was stable to transfer to the telemetry floor on 03/08/2020. His transfer was held since he continued to need atrial pacing due to some bradycardia. We decreased his coreg to assist with his heart rate. We continued to  encourage incentive spirometry and ambulation around the unit. POD 7, his heart rate and BP were much improved with with medication changes. He was still requiring 6L HFNC but making progress. He no longer required pacing since his native  rhythm was in the 70s and regular. He remained on IV antibiotics for his elevated WBC however, this was improving.   Addendum: Patient remained afebrile;however, his WBC on 07/10 increased again to 18,300. He had no sign of wound infection. UA was negative for infection. CXR did not show pneumonia (cardiomegaly, atelectasis, small left effusion). He did have a PICC line so this was removed. EPW were removed on 07/10. WBC decreased to 15,200 on 07/11. Rocephin was continued. As discussed with Dr. Kipp Brood, no need for antibiotic at discharge. Patient's creatinine was slightly elevated on 07/10 to 1.33. IV Lasix and Metolazone were stopped. He was given oral Lasix on 07/11 and creatinine on this date was slightly decreased to 1.29. He was also on Colchicine, which I presume was for possible pericarditis. This will be continued at discharge. He will be restarted on low dose Lisinopril for better BP control. He will be continued on daily Lasix/potassium for several days after discharge. He was on Simvastatin prior to admission;however, because he is on Amiodarone this will be stopped and Atorvastatin 10 mg daily will be prescribed. Patient was on Effient prior to admission but as discussed with surgeon, will not restart. Patient has been ambulating well on room air. Wounds are clean, dry and continuing to heal. He has been tolerating a diet and has had a bowel movement. As discussed with Dr. Kipp Brood, he is felt surgically stable for discharge home today.  Consults:  none  Significant Diagnostic Studies:   CLINICAL DATA:  Status post AVR.   EXAM: PORTABLE CHEST 1 VIEW   COMPARISON:  03/06/2020.   FINDINGS: Interim removal of bilateral chest tubes. Interval removal of mediastinal drainage catheter. Right PICC line in stable position. Prior CABG and cardiac valve replacement. Stable cardiomegaly. Stable bilateral subsegmental atelectasis. No prominent pleural effusion. No pneumothorax.    IMPRESSION: 1. Interim removal of bilateral chest tubes. Interim removal of mediastinal drainage catheter. Right PICC line in stable position. No evidence of pneumothorax.   2.  Prior CABG and cardiac valve replacement.  Stable cardiomegaly.   3.  Stable bilateral subsegmental atelectasis     Electronically Signed   By: Marcello Moores  Register   On: 03/07/2020 06:17  Treatments:  OPERATIVE REPORT   DATE OF PROCEDURE:  03/03/2020   OPERATION: 1.  Aortic valve replacement with an Inspiris Edwards pericardial tissue valve 21 mm, serial #[id number]. 2.  Coronary artery bypass grafting x3 using left internal mammary artery to OM1, saphenous vein graft to distal circumflex, saphenous vein graft to RCA.   3.  Endoscopic harvest of right leg greater saphenous vein.   4.  Video bronchoscopy for airway secretions and subsegmental right lower lobe atelectasis on postoperative chest x-ray in the OR.    SURGEON:  Ivin Poot, MD   ASSISTANT:  Nicholes Rough PA-C.   ANESTHESIA:  General by Dr. Janice Norrie.   PREOPERATIVE DIAGNOSES:  Severe aortic stenosis with preoperative syncope, left ventricular hypertrophy, severe multivessel coronary artery disease with previous drug-eluting stent to the proximal circumflex.   CLINICAL NOTE:  The patient is a 59 year old obese male followed at [facility] for coronary disease.  His last intervention was approximately 2-1/2 years ago.  He recently had an episode of syncope and a  subsequent  echocardiogram showed his moderate aortic stenosis was now severe with a peak transvalvular gradient greater than 50 mmHg.  He underwent repeat cardiac catheterization, which showed chronically occluded RCA, proximal 90% ostial stenosis of the circumflex  with a patent drug-eluting stent distal to the stenosis and 70% stenosis of an OM1 with minimal disease of the LAD.  He was referred for aortic valve replacement and coronary bypass grafting.  I saw  the patient and evaluated him for that operation in  the office and agreed with his cardiologist's recommendation for AVR-CABG as his best long-term therapy.  His ejection fraction was moderately reduced at 35-45%.  The patient wished a bioprosthetic valve, which I thought was reasonable at age 57.  He  understood the details of surgery including the use of general anesthesia and cardiopulmonary bypass, the location of the surgical incisions, and the expected postoperative recovery.  He understood the risks of bleeding, blood transfusion, stroke,  infection, organ failure, and death.  He agreed to proceed with surgery under what I felt was an informed consent.  Discharge Exam: Blood pressure (!) 156/71, pulse 84, temperature 98.2 F (36.8 C), temperature source Oral, resp. rate 19, height 5\' 6"  (1.676 m), weight 89 kg, SpO2 96 %. Cardiovascular: RRR, murmur heard best along left sternal border (? Flow murmur) Pulmonary: Clear bilaterally Abdomen: Soft, non tender, bowel sounds present. Extremities: Mild bilateral lower extremity edema R>L Wounds: Clean and dry.  No erythema or signs of infection.   Disposition: Discharge disposition: 01-Home or Self Care         Allergies as of 03/12/2020   No Known Allergies      Medication List     STOP taking these medications    amLODipine 10 MG tablet Commonly known as: NORVASC   hydrochlorothiazide 25 MG tablet Commonly known as: HYDRODIURIL   metoprolol succinate 25 MG 24 hr tablet Commonly known as: TOPROL-XL   prasugrel 10 MG Tabs tablet Commonly known as: EFFIENT   simvastatin 20 MG tablet Commonly known as: ZOCOR   vitamin B-12 1000 MCG tablet Commonly known as: CYANOCOBALAMIN       TAKE these medications    acetaminophen 325 MG tablet Commonly known as: TYLENOL Take 2 tablets (650 mg total) by mouth every 6 (six) hours as needed for mild pain or fever.   amiodarone 200 MG tablet Commonly known as: PACERONE Take  1 tablet (200 mg total) by mouth 2 (two) times daily. For 2 days then take 200 mg daily thereafter   aspirin 325 MG EC tablet Take 1 tablet (325 mg total) by mouth daily. Start taking on: March 13, 2020 What changed:  medication strength how much to take additional instructions   atorvastatin 10 MG tablet Commonly known as: Lipitor Take 1 tablet (10 mg total) by mouth daily.   carvedilol 3.125 MG tablet Commonly known as: COREG Take 1 tablet (3.125 mg total) by mouth 2 (two) times daily with a meal.   Cholecalciferol 50 MCG (2000 UT) Caps Take 2,000 Units by mouth daily.   colchicine 0.6 MG tablet Take 1 tablet (0.6 mg total) by mouth 2 (two) times daily.   ferrous sulfate 325 (65 FE) MG tablet Take 1 tablet (325 mg total) by mouth daily with breakfast. For one month then stop. If develops constipation, may stop sooner   Fish Oil 1200 MG Caps Take 1,200 mg by mouth 2 (two) times a week.   furosemide 40 MG tablet Commonly known as: LASIX  Take 1 tablet (40 mg total) by mouth daily. For 5 days then stop. Start taking on: March 13, 2020   lisinopril 20 MG tablet Commonly known as: ZESTRIL Take 1 tablet (20 mg total) by mouth daily. What changed:  medication strength how much to take   metFORMIN 500 MG tablet Commonly known as: GLUCOPHAGE Take 500 mg by mouth daily.   omeprazole 20 MG capsule Commonly known as: PRILOSEC Take 20-40 mg by mouth daily as needed.   oxyCODONE 5 MG immediate release tablet Commonly known as: Oxy IR/ROXICODONE Take 1 tablet (5 mg total) by mouth every 4 (four) hours as needed for severe pain.   potassium chloride SA 20 MEQ tablet Commonly known as: KLOR-CON Take 1 tablet (20 mEq total) by mouth daily. For 5 days then stop.   sildenafil 20 MG tablet Commonly known as: REVATIO Take 60-80 mg by mouth daily as needed (erectile dysfuntion.).        Follow-up Information     Paraschos, Alexander, MD. Call in 1 day(s).   Specialty:  Cardiology Why: Please call and set up a follow-up appointment with your cardiologist in 2 weeks following discharge.  Contact information: [address] [facility] [place] [address] [phone]         Ivin Poot, MD Follow up.   Specialty: Cardiothoracic Surgery Why: Your routine follow-up appointment is on 04/05/2020 at 11:00am. Please arrive at 10:30am for a chest xray at [facility] which is on the first floor of our building.  Contact information: [address] [place] [address] [phone]                The patient has been discharged on:   1.Beta Blocker:  Yes [ yes  ]                              No   [   ]                              If No, reason:  2.Ace Inhibitor/ARB: Yes [   ]                                     No  [ yes ]                                     If No, reason:  3.Statin:   Yes [ yes  ]                  No  [   ]                   If No, reason:  4.Ecasa:  Yes  [ yes  ]                  No   [   ]                  If No, reason:     Signed: Arnoldo Lenis 03/12/2020, 11:31 AM  patient examined and medical record reviewed,agree with above note. Tharon Aquas Trigt III 03/13/2020

## 2020-03-06 NOTE — H&P (Signed)
PCP is [place] Referring Provider is No ref. provider found  No chief complaint on file.   HPI: Patient examined, images of coronary angiogram and 2D echocardiogram personally reviewed and discussed with patient and wife.  59 year old employed married male with type 2 diabetes and history of PCI 10 years ago presents after recent diagnosis of severe aortic stenosis with multivessel coronary artery disease.  He works at a [employer] and has had 2 episodes of dizziness with near syncope and 1 episode of dizziness with full syncopal episode.  An echocardiogram showed severe aortic stenosis with valve area of less than 0.7 and mean gradient of 45.  Echo shows LVH with good LV systolic function and no other significant valvular disease.  Coronary arteriography shows patent LAD with high-grade 90% proximal circumflex stenosis, 80% stenosis at the takeoff of the OM1, and chronic occlusion of the RCA with filling via collaterals.  LVEDP was 14 and EF normal.  Patient is referred for aortic valve replacement with combined coronary bypass grafting.  States he has had a cardiac murmur for most of his life.  He denies family history of cardiac valve disease.  He denies personal history of rheumatic or scarlet fever as an adolescent.  He is right-hand dominant and denies any active dental complaints or chronic dental disease  He has been healthy and has worked continuously without hospitalization until being admitted thru the ED after his syncopal event.   Past Medical History:  Diagnosis Date  . Coronary artery disease   . High cholesterol   . Hypertension   . Myocardial infarct, old   . Plantar fasciitis     Past Surgical History:  Procedure Laterality Date  . CARDIAC SURGERY    . CORONARY ANGIOPLASTY WITH STENT PLACEMENT    . RIGHT/LEFT HEART CATH AND CORONARY ANGIOGRAPHY N/A 12/31/2017   Procedure: RIGHT/LEFT HEART CATH AND CORONARY ANGIOGRAPHY;  Surgeon: Isaias Cowman, MD;   Location: [facility];  Service: Cardiovascular;  Laterality: N/A;  . RIGHT/LEFT HEART CATH AND CORONARY ANGIOGRAPHY N/A 02/15/2020   Procedure: RIGHT/LEFT HEART CATH AND CORONARY ANGIOGRAPHY;  Surgeon: Isaias Cowman, MD;  Location: [facility];  Service: Cardiovascular;  Laterality: N/A;    Family History  Problem Relation Age of Onset  . Gout Brother     Social History Social History   Tobacco Use  . Smoking status: Former Smoker    Types: E-cigarettes  . Smokeless tobacco: Never Used  Vaping Use  . Vaping Use: Former  Substance Use Topics  . Alcohol use: Yes    Alcohol/week: 10.0 standard drinks    Types: 10 Cans of beer per week  . Drug use: Never    Current Facility-Administered Medications  Medication Dose Route Frequency Provider Last Rate Last Admin  . 0.45 % sodium chloride infusion   Intravenous Continuous PRN Elgie Collard, PA-C   Stopped at 03/05/20 1741  . 0.9 %  sodium chloride infusion (Manually program via Guardrails IV Fluids)   Intravenous Once Prescott Gum, Collier Salina, MD      . 0.9 %  sodium chloride infusion (Manually program via Guardrails IV Fluids)   Intravenous Once Prescott Gum, Collier Salina, MD      . 0.9 %  sodium chloride infusion  250 mL Intravenous Continuous Conte, Tessa N, PA-C      . 0.9 %  sodium chloride infusion   Intravenous Continuous Elgie Collard, PA-C 20 mL/hr at 03/03/20 2100 Rate Verify at 03/03/20 2100  .  acetaminophen (TYLENOL) tablet 1,000 mg  1,000 mg Oral Q6H Conte, Tessa N, PA-C   1,000 mg at 03/06/20 1112   Or  . acetaminophen (TYLENOL) 160 MG/5ML solution 1,000 mg  1,000 mg Per Tube Q6H Conte, Tessa N, PA-C   1,000 mg at 03/04/20 0513  . acetylcysteine (MUCOMYST) 20 % nebulizer / oral solution 4 mL  4 mL Nebulization Q6H Prescott Gum, Collier Salina, MD   4 mL at 03/06/20 1508  . amiodarone (NEXTERONE PREMIX) 360-4.14 MG/200ML-% (1.8 mg/mL) IV infusion  30 mg/hr Intravenous Continuous Prescott Gum, Collier Salina, MD 16.67 mL/hr at 03/06/20  1700 30 mg/hr at 03/06/20 1700  . aspirin EC tablet 325 mg  325 mg Oral Daily Elgie Collard, [place]   325 mg at 03/06/20 1017   Or  . aspirin chewable tablet 324 mg  324 mg Per Tube Daily Conte, Tessa N, PA-C      . bisacodyl (DULCOLAX) EC tablet 10 mg  10 mg Oral Daily Nicholes Rough N, [place]   10 mg at 03/06/20 5102   Or  . bisacodyl (DULCOLAX) suppository 10 mg  10 mg Rectal Daily Conte, Tessa N, PA-C      . carvedilol (COREG) tablet 6.25 mg  6.25 mg Oral BID WC Prescott Gum, Collier Salina, MD   3.125 mg at 03/06/20 0954  . cefTRIAXone (ROCEPHIN) 2 g in sodium chloride 0.9 % 100 mL IVPB  2 g Intravenous Q24H Ivin Poot, MD   Stopped at 03/05/20 2117  . chlorhexidine (PERIDEX) 0.12 % solution 15 mL  15 mL Mouth Rinse BID Prescott Gum, Collier Salina, MD   15 mL at 03/06/20 1000  . Chlorhexidine Gluconate Cloth 2 % PADS 6 each  6 each Topical Daily Ivin Poot, MD   6 each at 03/06/20 1100  . docusate sodium (COLACE) capsule 200 mg  200 mg Oral Daily Nicholes Rough N, PA-C   200 mg at 03/06/20 5852  . fentaNYL (SUBLIMAZE) injection 50 mcg  50 mcg Intravenous Q2H PRN Prescott Gum, Collier Salina, MD      . furosemide (LASIX) 250 mg in dextrose 5 % 250 mL (1 mg/mL) infusion  8 mg/hr Intravenous Continuous Prescott Gum, Collier Salina, MD 8 mL/hr at 03/06/20 1700 8 mg/hr at 03/06/20 1700  . guaiFENesin (MUCINEX) 12 hr tablet 600 mg  600 mg Oral BID Prescott Gum, Collier Salina, MD   600 mg at 03/06/20 0951  . insulin aspart (novoLOG) injection 0-24 Units  0-24 Units Subcutaneous TID WC & HS Ivin Poot, MD   4 Units at 03/06/20 1535  . insulin detemir (LEVEMIR) injection 5 Units  5 Units Subcutaneous BID Ivin Poot, MD   5 Units at 03/06/20 [id number]  . lactated ringers infusion   Intravenous Continuous Conte, Tessa N, PA-C      . levalbuterol [facility]) nebulizer solution 0.63 mg  0.63 mg Nebulization Q8H Prescott Gum, Collier Salina, MD      . levalbuterol [facility]) nebulizer solution 1.25 mg  1.25 mg Nebulization Q6H PRN Ivin Poot, MD   1.25 mg at  03/05/20 0204  . MEDLINE mouth rinse  15 mL Mouth Rinse q12n4p Prescott Gum, Collier Salina, MD   15 mL at 03/06/20 1536  . metoCLOPramide (REGLAN) injection 10 mg  10 mg Intravenous Q6H Ivin Poot, MD   10 mg at 03/06/20 1112  . metoprolol tartrate (LOPRESSOR) injection 2.5-5 mg  2.5-5 mg Intravenous Q2H PRN Conte, Tessa N, PA-C      . milrinone (PRIMACOR) 20 MG/100  ML (0.2 mg/mL) infusion  0.25 mcg/kg/min Intravenous Continuous Prescott Gum, Collier Salina, MD 6.64 mL/hr at 03/06/20 1700 0.25 mcg/kg/min at 03/06/20 1700  . ondansetron (ZOFRAN) injection 4 mg  4 mg Intravenous Q6H PRN Elgie Collard, PA-C   4 mg at 03/03/20 2336  . oxyCODONE (Oxy IR/ROXICODONE) immediate release tablet 5 mg  5 mg Oral Q3H PRN Ivin Poot, MD   5 mg at 03/06/20 1432  . pantoprazole (PROTONIX) EC tablet 40 mg  40 mg Oral Daily Elgie Collard, PA-C   40 mg at 03/06/20 0951  . simethicone (MYLICON) chewable tablet 80 mg  80 mg Oral QID Prescott Gum, Collier Salina, MD   80 mg at 03/06/20 1537  . simvastatin (ZOCOR) tablet 20 mg  20 mg Oral QPM Conte, Tessa N, PA-C   20 mg at 03/05/20 1704  . sodium chloride flush (NS) 0.9 % injection 10-40 mL  10-40 mL Intracatheter Q12H Ivin Poot, MD   10 mL at 03/05/20 2113  . sodium chloride flush (NS) 0.9 % injection 10-40 mL  10-40 mL Intracatheter PRN Ivin Poot, MD   10 mL at 03/03/20 2228  . sodium chloride flush (NS) 0.9 % injection 3 mL  3 mL Intravenous Q12H Conte, Tessa N, PA-C   3 mL at 03/06/20 7035  . sodium chloride flush (NS) 0.9 % injection 3 mL  3 mL Intravenous PRN Conte, Tessa N, PA-C      . traMADol (ULTRAM) tablet 50-100 mg  50-100 mg Oral Q4H PRN Harriet Pho, Tessa N, PA-C   50 mg at 03/06/20 0551    No Known Allergies  Review of Systems       Right-hand-dominant History right clavicle fracture as a adolescent Remote history of smoking Drinks beer 2 to 3/day No history of DVT claudication varicose veins             Review of Systems :  [ y ] = yes, [  ] = no         General :  Weight gain [   ]    Weight loss  [   ]  Fatigue [  ]  Fever [  ]  Chills  [  ]                                          HEENT    Headache [  ]  Dizziness Blue.Reese ]  Blurred vision [  ] Glaucoma  [  ]                          Nosebleeds [  ] Painful or loose teeth [  ]        Cardiac :  Chest pain/ pressure [  ]  Resting SOB [  ] exertional SOB [  ]                        Orthopnea [  ]  Pedal edema  [  ]  Palpitations [  ] Syncope/presyncope Curtis Maxwell ]                        Paroxysmal nocturnal dyspnea [  ]         Pulmonary : cough [  ]  wheezing [  ]  Hemoptysis [  ] Sputum [  ] Snoring [  ]                              Pneumothorax [  ]  Sleep apnea [  ]        GI : Vomiting [  ]  Dysphagia [  ]  Melena  [  ]  Abdominal pain [  ] BRBPR [  ]              Heart burn [  ]  Constipation [  ] Diarrhea  [  ] Colonoscopy [   ]        GU : Hematuria [  ]  Dysuria [  ]  Nocturia [  ] UTI's [  ]        Vascular : Claudication [  ]  Rest pain [  ]  DVT [  ] Vein stripping [  ] leg ulcers [  ]                          TIA [  ] Stroke [  ]  Varicose veins [  ]        NEURO :  Headaches  [  ] Seizures [  ] Vision changes [  ] Paresthesias [  ]                                               Musculoskeletal :  Arthritis [  ] Gout  [  ]  Back pain [  ]  Joint pain [  ]        Skin :  Rash [  ]  Melanoma [  ] Sores [  ]        Heme : Bleeding problems [  ]Clotting Disorders [  ] Anemia [  ]Blood Transfusion [ ]         Endocrine : Diabetes Blue.Reese  ] Heat or Cold intolerance [  ] Polyuria [  ]excessive thirst [ ]         Psych : Depression [  ]  Anxiety [  ]  Psych hospitalizations [  ] Memory change [  ]                                                                            BP 121/62   Pulse 73   Temp 98.4 F (36.9 C) (Oral)   Resp (!) 40   Ht 5\' 6"  (1.676 m)   Wt 90.7 kg   SpO2 93%   BMI 32.27 kg/m  Physical Exam      Physical Exam  General: Very sharp middle-aged male  accompanied by his wife no acute distress HEENT: Normocephalic pupils equal , dentition adequate Neck: Supple without JVD, adenopathy, or bruit Chest: Clear to auscultation, symmetrical breath sounds, no rhonchi, no tenderness             or deformity Cardiovascular: Regular  rate and rhythm, 3/6 systolic ejection murmur of AS without  gallop, peripheral pulses             palpable in all extremities Abdomen:  Soft, nontender, no palpable mass or organomegaly Extremities: Warm, well-perfused, no clubbing cyanosis edema or tenderness,              no venous stasis changes of the legs Rectal/GU: Deferred Neuro: Grossly non--focal and symmetrical throughout Skin: Clean and dry without rash or ulceration   Diagnostic Tests: Images of coronary angiogram and echocardiogram personally reviewed as noted above  Impression: Severe aortic stenosis with syncope Severe multivessel coronary disease with chronic RCA occlusion and 90% ostial circumflex stenosis Preserved LV systolic function Hypertension Type 2 diabetes  Plan: Patient would benefit from aortic valve replacement and combined CABG. We discussed the types of valves available and both agree that a bioprosthetic valve would be his best long-term option. Plan bypass graft to the OM1, distal circumflex, and posterior descending. Patient understands the benefits and risks of surgery as well as the expected recovery and his inability to return to his physical job for approximately 3 months.  Surgery scheduled at [facility] on July 2 with preoperative visit to be arranged 2 to 3 days before surgery   Len Childs, MD [facility] [phone]

## 2020-03-07 ENCOUNTER — Inpatient Hospital Stay (HOSPITAL_COMMUNITY): Payer: Commercial Managed Care - PPO

## 2020-03-07 LAB — POCT I-STAT 7, (LYTES, BLD GAS, ICA,H+H)
Acid-base deficit: 3 mmol/L — ABNORMAL HIGH (ref 0.0–2.0)
Acid-base deficit: 4 mmol/L — ABNORMAL HIGH (ref 0.0–2.0)
Acid-base deficit: 1 mmol/L (ref 0.0–2.0)
Acid-base deficit: 1 mmol/L (ref 0.0–2.0)
Acid-base deficit: 5 mmol/L — ABNORMAL HIGH (ref 0.0–2.0)
Acid-base deficit: 4 mmol/L — ABNORMAL HIGH (ref 0.0–2.0)
Bicarbonate: 22.2 mmol/L (ref 20.0–28.0)
Bicarbonate: 21.4 mmol/L (ref 20.0–28.0)
Bicarbonate: 23.5 mmol/L (ref 20.0–28.0)
Bicarbonate: 23.7 mmol/L (ref 20.0–28.0)
Bicarbonate: 20.6 mmol/L (ref 20.0–28.0)
Bicarbonate: 21.6 mmol/L (ref 20.0–28.0)
Calcium, Ion: 1.28 mmol/L (ref 1.15–1.40)
Calcium, Ion: 1.26 mmol/L (ref 1.15–1.40)
Calcium, Ion: 1.02 mmol/L — ABNORMAL LOW (ref 1.15–1.40)
Calcium, Ion: 1.06 mmol/L — ABNORMAL LOW (ref 1.15–1.40)
Calcium, Ion: 1.05 mmol/L — ABNORMAL LOW (ref 1.15–1.40)
Calcium, Ion: 0.96 mmol/L — ABNORMAL LOW (ref 1.15–1.40)
HCT: 41 % (ref 39.0–52.0)
HCT: 37 % — ABNORMAL LOW (ref 39.0–52.0)
HCT: 27 % — ABNORMAL LOW (ref 39.0–52.0)
HCT: 27 % — ABNORMAL LOW (ref 39.0–52.0)
HCT: 25 % — ABNORMAL LOW (ref 39.0–52.0)
HCT: 28 % — ABNORMAL LOW (ref 39.0–52.0)
Hemoglobin: 13.9 g/dL (ref 13.0–17.0)
Hemoglobin: 12.6 g/dL — ABNORMAL LOW (ref 13.0–17.0)
Hemoglobin: 9.2 g/dL — ABNORMAL LOW (ref 13.0–17.0)
Hemoglobin: 9.2 g/dL — ABNORMAL LOW (ref 13.0–17.0)
Hemoglobin: 8.5 g/dL — ABNORMAL LOW (ref 13.0–17.0)
Hemoglobin: 9.5 g/dL — ABNORMAL LOW (ref 13.0–17.0)
O2 Saturation: 97 %
O2 Saturation: 97 %
O2 Saturation: 100 %
O2 Saturation: 100 %
O2 Saturation: 93 %
O2 Saturation: 98 %
Potassium: 4.1 mmol/L (ref 3.5–5.1)
Potassium: 4 mmol/L (ref 3.5–5.1)
Potassium: 4.1 mmol/L (ref 3.5–5.1)
Potassium: 4 mmol/L (ref 3.5–5.1)
Potassium: 3.8 mmol/L (ref 3.5–5.1)
Potassium: 3.6 mmol/L (ref 3.5–5.1)
Sodium: 138 mmol/L (ref 135–145)
Sodium: 139 mmol/L (ref 135–145)
Sodium: 141 mmol/L (ref 135–145)
Sodium: 136 mmol/L (ref 135–145)
Sodium: 141 mmol/L (ref 135–145)
Sodium: 142 mmol/L (ref 135–145)
TCO2: 23 mmol/L (ref 22–32)
TCO2: 23 mmol/L (ref 22–32)
TCO2: 25 mmol/L (ref 22–32)
TCO2: 25 mmol/L (ref 22–32)
TCO2: 22 mmol/L (ref 22–32)
TCO2: 23 mmol/L (ref 22–32)
pCO2 arterial: 38.6 mmHg (ref 32.0–48.0)
pCO2 arterial: 40.5 mmHg (ref 32.0–48.0)
pCO2 arterial: 36.7 mmHg (ref 32.0–48.0)
pCO2 arterial: 39.1 mmHg (ref 32.0–48.0)
pCO2 arterial: 41.1 mmHg (ref 32.0–48.0)
pCO2 arterial: 43.4 mmHg (ref 32.0–48.0)
pH, Arterial: 7.367 (ref 7.350–7.450)
pH, Arterial: 7.332 — ABNORMAL LOW (ref 7.350–7.450)
pH, Arterial: 7.414 (ref 7.350–7.450)
pH, Arterial: 7.39 (ref 7.350–7.450)
pH, Arterial: 7.307 — ABNORMAL LOW (ref 7.350–7.450)
pH, Arterial: 7.306 — ABNORMAL LOW (ref 7.350–7.450)
pO2, Arterial: 94 mmHg (ref 83.0–108.0)
pO2, Arterial: 97 mmHg (ref 83.0–108.0)
pO2, Arterial: 310 mmHg — ABNORMAL HIGH (ref 83.0–108.0)
pO2, Arterial: 374 mmHg — ABNORMAL HIGH (ref 83.0–108.0)
pO2, Arterial: 74 mmHg — ABNORMAL LOW (ref 83.0–108.0)
pO2, Arterial: 126 mmHg — ABNORMAL HIGH (ref 83.0–108.0)

## 2020-03-07 LAB — POCT I-STAT, CHEM 8
BUN: 17 mg/dL (ref 6–20)
BUN: 15 mg/dL (ref 6–20)
BUN: 14 mg/dL (ref 6–20)
BUN: 15 mg/dL (ref 6–20)
BUN: 14 mg/dL (ref 6–20)
BUN: 15 mg/dL (ref 6–20)
BUN: 14 mg/dL (ref 6–20)
Calcium, Ion: 1.31 mmol/L (ref 1.15–1.40)
Calcium, Ion: 1.28 mmol/L (ref 1.15–1.40)
Calcium, Ion: 1.05 mmol/L — ABNORMAL LOW (ref 1.15–1.40)
Calcium, Ion: 1.14 mmol/L — ABNORMAL LOW (ref 1.15–1.40)
Calcium, Ion: 1.06 mmol/L — ABNORMAL LOW (ref 1.15–1.40)
Calcium, Ion: 1.09 mmol/L — ABNORMAL LOW (ref 1.15–1.40)
Calcium, Ion: 0.96 mmol/L — ABNORMAL LOW (ref 1.15–1.40)
Chloride: 102 mmol/L (ref 98–111)
Chloride: 104 mmol/L (ref 98–111)
Chloride: 101 mmol/L (ref 98–111)
Chloride: 101 mmol/L (ref 98–111)
Chloride: 102 mmol/L (ref 98–111)
Chloride: 101 mmol/L (ref 98–111)
Chloride: 104 mmol/L (ref 98–111)
Creatinine, Ser: 0.7 mg/dL (ref 0.61–1.24)
Creatinine, Ser: 0.8 mg/dL (ref 0.61–1.24)
Creatinine, Ser: 0.6 mg/dL — ABNORMAL LOW (ref 0.61–1.24)
Creatinine, Ser: 0.7 mg/dL (ref 0.61–1.24)
Creatinine, Ser: 0.7 mg/dL (ref 0.61–1.24)
Creatinine, Ser: 0.9 mg/dL (ref 0.61–1.24)
Creatinine, Ser: 0.8 mg/dL (ref 0.61–1.24)
Glucose, Bld: 123 mg/dL — ABNORMAL HIGH (ref 70–99)
Glucose, Bld: 93 mg/dL (ref 70–99)
Glucose, Bld: 80 mg/dL (ref 70–99)
Glucose, Bld: 155 mg/dL — ABNORMAL HIGH (ref 70–99)
Glucose, Bld: 142 mg/dL — ABNORMAL HIGH (ref 70–99)
Glucose, Bld: 148 mg/dL — ABNORMAL HIGH (ref 70–99)
Glucose, Bld: 127 mg/dL — ABNORMAL HIGH (ref 70–99)
HCT: 36 % — ABNORMAL LOW (ref 39.0–52.0)
HCT: 36 % — ABNORMAL LOW (ref 39.0–52.0)
HCT: 26 % — ABNORMAL LOW (ref 39.0–52.0)
HCT: 28 % — ABNORMAL LOW (ref 39.0–52.0)
HCT: 22 % — ABNORMAL LOW (ref 39.0–52.0)
HCT: 24 % — ABNORMAL LOW (ref 39.0–52.0)
HCT: 26 % — ABNORMAL LOW (ref 39.0–52.0)
Hemoglobin: 12.2 g/dL — ABNORMAL LOW (ref 13.0–17.0)
Hemoglobin: 12.2 g/dL — ABNORMAL LOW (ref 13.0–17.0)
Hemoglobin: 8.8 g/dL — ABNORMAL LOW (ref 13.0–17.0)
Hemoglobin: 9.5 g/dL — ABNORMAL LOW (ref 13.0–17.0)
Hemoglobin: 7.5 g/dL — ABNORMAL LOW (ref 13.0–17.0)
Hemoglobin: 8.2 g/dL — ABNORMAL LOW (ref 13.0–17.0)
Hemoglobin: 8.8 g/dL — ABNORMAL LOW (ref 13.0–17.0)
Potassium: 4.1 mmol/L (ref 3.5–5.1)
Potassium: 4.2 mmol/L (ref 3.5–5.1)
Potassium: 4.1 mmol/L (ref 3.5–5.1)
Potassium: 3.9 mmol/L (ref 3.5–5.1)
Potassium: 4.7 mmol/L (ref 3.5–5.1)
Potassium: 3.9 mmol/L (ref 3.5–5.1)
Potassium: 3.7 mmol/L (ref 3.5–5.1)
Sodium: 138 mmol/L (ref 135–145)
Sodium: 139 mmol/L (ref 135–145)
Sodium: 139 mmol/L (ref 135–145)
Sodium: 137 mmol/L (ref 135–145)
Sodium: 138 mmol/L (ref 135–145)
Sodium: 140 mmol/L (ref 135–145)
Sodium: 141 mmol/L (ref 135–145)
TCO2: 24 mmol/L (ref 22–32)
TCO2: 22 mmol/L (ref 22–32)
TCO2: 26 mmol/L (ref 22–32)
TCO2: 26 mmol/L (ref 22–32)
TCO2: 24 mmol/L (ref 22–32)
TCO2: 21 mmol/L — ABNORMAL LOW (ref 22–32)
TCO2: 23 mmol/L (ref 22–32)

## 2020-03-07 LAB — CBC
HCT: 23.6 % — ABNORMAL LOW (ref 39.0–52.0)
Hemoglobin: 7.4 g/dL — ABNORMAL LOW (ref 13.0–17.0)
MCH: 29.1 pg (ref 26.0–34.0)
MCHC: 31.4 g/dL (ref 30.0–36.0)
MCV: 92.9 fL (ref 80.0–100.0)
Platelets: 145 10*3/uL — ABNORMAL LOW (ref 150–400)
RBC: 2.54 MIL/uL — ABNORMAL LOW (ref 4.22–5.81)
RDW: 13.6 % (ref 11.5–15.5)
WBC: 19.5 10*3/uL — ABNORMAL HIGH (ref 4.0–10.5)
nRBC: 0.3 % — ABNORMAL HIGH (ref 0.0–0.2)

## 2020-03-07 LAB — BASIC METABOLIC PANEL
Anion gap: 11 (ref 5–15)
Anion gap: 13 (ref 5–15)
BUN: 19 mg/dL (ref 6–20)
BUN: 26 mg/dL — ABNORMAL HIGH (ref 6–20)
CO2: 25 mmol/L (ref 22–32)
CO2: 23 mmol/L (ref 22–32)
Calcium: 8.1 mg/dL — ABNORMAL LOW (ref 8.9–10.3)
Calcium: 8.4 mg/dL — ABNORMAL LOW (ref 8.9–10.3)
Chloride: 96 mmol/L — ABNORMAL LOW (ref 98–111)
Chloride: 98 mmol/L (ref 98–111)
Creatinine, Ser: 1.11 mg/dL (ref 0.61–1.24)
Creatinine, Ser: 1.38 mg/dL — ABNORMAL HIGH (ref 0.61–1.24)
GFR calc Af Amer: >60 mL/min (ref >60)
GFR calc Af Amer: >60 mL/min (ref >60)
GFR calc non Af Amer: >60 mL/min (ref >60)
GFR calc non Af Amer: 56 mL/min — ABNORMAL LOW (ref >60)
Glucose, Bld: 193 mg/dL — ABNORMAL HIGH (ref 70–99)
Glucose, Bld: 222 mg/dL — ABNORMAL HIGH (ref 70–99)
Potassium: 3.8 mmol/L (ref 3.5–5.1)
Potassium: 4.1 mmol/L (ref 3.5–5.1)
Sodium: 132 mmol/L — ABNORMAL LOW (ref 135–145)
Sodium: 134 mmol/L — ABNORMAL LOW (ref 135–145)

## 2020-03-07 LAB — GLUCOSE, CAPILLARY
Glucose-Capillary: 140 mg/dL — ABNORMAL HIGH (ref 70–99)
Glucose-Capillary: 119 mg/dL — ABNORMAL HIGH (ref 70–99)
Glucose-Capillary: 147 mg/dL — ABNORMAL HIGH (ref 70–99)
Glucose-Capillary: 134 mg/dL — ABNORMAL HIGH (ref 70–99)

## 2020-03-07 LAB — COOXEMETRY PANEL
Carboxyhemoglobin: 0.9 % (ref 0.5–1.5)
Methemoglobin: 1 % (ref 0.0–1.5)
O2 Saturation: 58.8 %
Total hemoglobin: 7.7 g/dL — ABNORMAL LOW (ref 12.0–16.0)

## 2020-03-07 LAB — SURGICAL PATHOLOGY

## 2020-03-07 MED ORDER — NAPROXEN 250 MG PO TABS
500.0000 mg | ORAL_TABLET | Freq: Once | ORAL | Status: AC
  Administered 2020-03-07: 500 mg via ORAL
  Filled 2020-03-07: qty 2

## 2020-03-07 MED ORDER — INSULIN DETEMIR 100 UNIT/ML ~~LOC~~ SOLN
10.0000 [IU] | Freq: Two times a day (BID) | SUBCUTANEOUS | Status: DC
  Administered 2020-03-07 – 2020-03-12 (×10): 10 [IU] via SUBCUTANEOUS
  Filled 2020-03-07 (×12): qty 0.1

## 2020-03-07 MED ORDER — LEVALBUTEROL HCL 0.63 MG/3ML IN NEBU
0.6300 mg | INHALATION_SOLUTION | Freq: Two times a day (BID) | RESPIRATORY_TRACT | Status: DC
  Administered 2020-03-07 – 2020-03-09 (×4): 0.63 mg via RESPIRATORY_TRACT
  Filled 2020-03-07 (×4): qty 3

## 2020-03-07 MED ORDER — POTASSIUM CHLORIDE CRYS ER 20 MEQ PO TBCR
20.0000 meq | EXTENDED_RELEASE_TABLET | ORAL | Status: AC
  Administered 2020-03-07 (×3): 20 meq via ORAL
  Filled 2020-03-07 (×3): qty 1

## 2020-03-07 MED ORDER — FUROSEMIDE 10 MG/ML IJ SOLN
40.0000 mg | Freq: Every day | INTRAMUSCULAR | Status: DC
  Administered 2020-03-07 – 2020-03-10 (×4): 40 mg via INTRAVENOUS
  Filled 2020-03-07 (×4): qty 4

## 2020-03-07 MED ORDER — MILRINONE LACTATE IN DEXTROSE 20-5 MG/100ML-% IV SOLN
0.1250 ug/kg/min | INTRAVENOUS | Status: DC
  Administered 2020-03-07: 0.125 ug/kg/min via INTRAVENOUS

## 2020-03-07 MED ORDER — AMIODARONE HCL 200 MG PO TABS
400.0000 mg | ORAL_TABLET | Freq: Two times a day (BID) | ORAL | Status: DC
  Administered 2020-03-07 – 2020-03-08 (×4): 400 mg via ORAL
  Filled 2020-03-07 (×4): qty 2

## 2020-03-07 MED ORDER — COLCHICINE 0.6 MG PO TABS
0.6000 mg | ORAL_TABLET | Freq: Two times a day (BID) | ORAL | Status: DC
  Administered 2020-03-07 – 2020-03-12 (×10): 0.6 mg via ORAL
  Filled 2020-03-07 (×11): qty 1

## 2020-03-07 MED FILL — Magnesium Sulfate Inj 50%: INTRAMUSCULAR | Qty: 10 | Status: AC

## 2020-03-07 MED FILL — Potassium Chloride Inj 2 mEq/ML: INTRAVENOUS | Qty: 40 | Status: AC

## 2020-03-07 MED FILL — Heparin Sodium (Porcine) Inj 1000 Unit/ML: INTRAMUSCULAR | Qty: 30 | Status: AC

## 2020-03-07 NOTE — Progress Notes (Signed)
Patient titratedfrom HFNC at 15L to [place] at 4L throughout the day. O2 sat 94.

## 2020-03-07 NOTE — Anesthesia Postprocedure Evaluation (Signed)
Anesthesia Post Note  Patient: NAM VOSSLER  Procedure(s) Performed: AORTIC VALVE REPLACEMENT (AVR) Inspiris 86m (N/A Chest) CORONARY ARTERY BYPASS GRAFTING (CABG) x3. Endoscopic saphenous vein harvest   (N/A Chest) TRANSESOPHAGEAL ECHOCARDIOGRAM (TEE) (N/A Esophagus) FLEXIBLE BRONCHOSCOPY (N/A Bronchus)     Patient location during evaluation: SICU Anesthesia Type: General Level of consciousness: sedated and patient cooperative Pain management: pain level controlled Vital Signs Assessment: post-procedure vital signs reviewed and stable Respiratory status: spontaneous breathing Cardiovascular status: stable Anesthetic complications: no   No complications documented.  Last Vitals:  Vitals:   03/07/20 1500 03/07/20 1503  BP: 133/66   Pulse: 80   Resp: (!) 21   Temp:  37.4 C  SpO2: 96%     Last Pain:  Vitals:   03/07/20 1200  TempSrc:   PainSc: 0-No pain                 JNolon Nations

## 2020-03-07 NOTE — Progress Notes (Signed)
Patient ID: Curtis Maxwell, male   DOB: Apr 22, 1961, 59 y.o.   MRN: [id number] [facility] Evening Rounds:  Hemodynamically stable on milrinone 0.125.  sats 91% on 4L [place].  Urine output ok  BMET    Component Value Date/Time   NA 134 (L) 03/07/2020 1700   K 4.1 03/07/2020 1700   CL 98 03/07/2020 1700   CO2 23 03/07/2020 1700   GLUCOSE 222 (H) 03/07/2020 1700   BUN 26 (H) 03/07/2020 1700   CREATININE 1.38 (H) 03/07/2020 1700   CALCIUM 8.4 (L) 03/07/2020 1700   GFRNONAA 56 (L) 03/07/2020 1700   GFRAA >60 03/07/2020 1700

## 2020-03-07 NOTE — Progress Notes (Addendum)
[facility] DAILY ICU PROGRESS NOTE                   [address]            [place] [address]          [phone]   4 Days Post-Op Procedure(s) (LRB): AORTIC VALVE REPLACEMENT (AVR) Inspiris 19m (N/A) CORONARY ARTERY BYPASS GRAFTING (CABG) x3. Endoscopic saphenous vein harvest   (N/A) TRANSESOPHAGEAL ECHOCARDIOGRAM (TEE) (N/A) FLEXIBLE BRONCHOSCOPY (N/A)  Total Length of Stay:  LOS: 4 days   Subjective: Feels okay this morning. His appetite is coming back.   Objective: Vital signs in last 24 hours: Temp:  [98.3 F (36.8 C)-99.3 F (37.4 C)] 99.3 F (37.4 C) (07/06 0741) Pulse Rate:  [67-80] 80 (07/06 0600) Cardiac Rhythm: Atrial paced (07/06 0400) Resp:  [11-40] 17 (07/06 0600) BP: (94-146)/(53-85) 145/60 (07/06 0600) SpO2:  [90 %-100 %] 93 % (07/06 0600) FiO2 (%):  [50 %] 50 % (07/05 1237) Weight:  [91.6 kg] 91.6 kg (07/06 0600)  Filed Weights   03/05/20 0600 03/06/20 0600 03/07/20 0600  Weight: 93.1 kg 90.7 kg 91.6 kg    Weight change: 0.9 kg   Hemodynamic parameters for last 24 hours: CVP:  [14 mmHg-21 mmHg] 17 mmHg  Intake/Output from previous day: 07/05 0701 - 07/06 0700 In: 2657.6 [I.V.:2402.3; IV Piggyback:255.3] Out: 19147[Urine:1520; Chest Tube:50]  Intake/Output this shift: No intake/output data recorded.  Current Meds: Scheduled Meds: . sodium chloride   Intravenous Once  . sodium chloride   Intravenous Once  . acetaminophen  1,000 mg Oral Q6H   Or  . acetaminophen (TYLENOL) oral liquid 160 mg/5 mL  1,000 mg Per Tube Q6H  . acetylcysteine  4 mL Nebulization Q6H  . aspirin EC  325 mg Oral Daily   Or  . aspirin  324 mg Per Tube Daily  . bisacodyl  10 mg Oral Daily   Or  . bisacodyl  10 mg Rectal Daily  . carvedilol  6.25 mg Oral BID WC  . chlorhexidine  15 mL Mouth Rinse BID  . Chlorhexidine Gluconate Cloth  6 each Topical Daily  . docusate sodium  200 mg Oral Daily  . enoxaparin (LOVENOX) injection  40 mg Subcutaneous Q24H    . ferrous [id number]-folic acid  1 capsule Oral TID PC  . furosemide  20 mg Intravenous BID  . guaiFENesin  600 mg Oral BID  . insulin aspart  0-24 Units Subcutaneous TID WC & HS  . insulin detemir  5 Units Subcutaneous BID  . levalbuterol  0.63 mg Nebulization Q6H  . mouth rinse  15 mL Mouth Rinse q12n4p  . metoCLOPramide (REGLAN) injection  10 mg Intravenous Q6H  . pantoprazole  40 mg Oral Daily  . simethicone  80 mg Oral QID  . simvastatin  20 mg Oral QPM  . sodium chloride flush  10-40 mL Intracatheter Q12H  . sodium chloride flush  3 mL Intravenous Q12H   Continuous Infusions: . sodium chloride Stopped (03/05/20 1741)  . sodium chloride    . sodium chloride 20 mL/hr at 03/03/20 2100  . amiodarone 30 mg/hr (03/07/20 0600)  . cefTRIAXone (ROCEPHIN)  IV Stopped (03/06/20 2056)  . lactated ringers    . milrinone 0.25 mcg/kg/min (03/07/20 0600)   PRN Meds:.sodium chloride, fentaNYL (SUBLIMAZE) injection, levalbuterol, metoprolol tartrate, ondansetron (ZOFRAN) IV, oxyCODONE, sodium chloride flush, sodium chloride flush, traMADol  General appearance: alert, cooperative and no distress Heart: regular rate  and rhythm, S1, S2 normal, no murmur, click, rub or gallop Lungs: + wheezing, diminished in the lower lobes Abdomen: soft, non-tender; bowel sounds normal; no masses,  no organomegaly Extremities: extremities normal, atraumatic, no cyanosis or edema Wound: clean and dry  Lab Results: CBC: Recent Labs    03/06/20 1754 03/07/20 0355  WBC 17.9* 19.5*  HGB 7.4* 7.4*  HCT 23.9* 23.6*  PLT 124* 145*   BMET:  Recent Labs    03/06/20 1754 03/07/20 0355  NA 129* 132*  K 3.9 3.8  CL 93* 96*  CO2 25 25  GLUCOSE 125* 193*  BUN 16 19  CREATININE 1.09 1.11  CALCIUM 8.2* 8.1*    CMET: Lab Results  Component Value Date   WBC 19.5 (H) 03/07/2020   HGB 7.4 (L) 03/07/2020   HCT 23.6 (L) 03/07/2020   PLT 145 (L) 03/07/2020   GLUCOSE 193 (H) 03/07/2020   ALT 35  03/02/2020   AST 28 03/02/2020   NA 132 (L) 03/07/2020   K 3.8 03/07/2020   CL 96 (L) 03/07/2020   CREATININE 1.11 03/07/2020   BUN 19 03/07/2020   CO2 25 03/07/2020   INR 1.3 (H) 03/03/2020   HGBA1C 6.1 (H) 03/02/2020      PT/INR: No results for input(s): LABPROT, INR in the last 72 hours. Radiology: [facility] Chest Port 1 View  Result Date: 03/07/2020 CLINICAL DATA:  Status post AVR. EXAM: PORTABLE CHEST 1 VIEW COMPARISON:  03/06/2020. FINDINGS: Interim removal of bilateral chest tubes. Interval removal of mediastinal drainage catheter. Right PICC line in stable position. Prior CABG and cardiac valve replacement. Stable cardiomegaly. Stable bilateral subsegmental atelectasis. No prominent pleural effusion. No pneumothorax. IMPRESSION: 1. Interim removal of bilateral chest tubes. Interim removal of mediastinal drainage catheter. Right PICC line in stable position. No evidence of pneumothorax. 2.  Prior CABG and cardiac valve replacement.  Stable cardiomegaly. 3.  Stable bilateral subsegmental atelectasis Electronically Signed   By: Marcello Moores  Register   On: 03/07/2020 06:17     Assessment/Plan: S/P Procedure(s) (LRB): AORTIC VALVE REPLACEMENT (AVR) Inspiris 27m (N/A) CORONARY ARTERY BYPASS GRAFTING (CABG) x3. Endoscopic saphenous vein harvest   (N/A) TRANSESOPHAGEAL ECHOCARDIOGRAM (TEE) (N/A) FLEXIBLE BRONCHOSCOPY (N/A)  1. CV-NSR in the 711s A-paced. Post-op afib and was started on Amio IV. BP well controlled. coox 58.8. On milrinone 0.25 2. Pulm-remains on 15L on HFNC. CXR stable, no pneumo. Off lasix drip. He is on xopenex and mucinex. Continue pulmonary toilet.  3. Renal-creatinine 1.11, electrolytes okay 4. H and H 7.4/23.6, expected acute blood loss anemia 5. Endo-blood glucose well controlled 6. WBC 19.5, continue Rocephin IV  Plan: discontinue foley catheter. Continue to ambulate as needed. Off lasix drip but continues to need 15L HFNC. Continue breathing nebs, and  pulm toilet.  Remains on Amio IV for rhythm.     TElgie Collard7/02/2020 7:46 AM   Postop progression now improving- needs ambulation in hall and wean from high flow O2 in next 24 hrs Amiodarone transition to po, resume IV lasix daily dose, wean milrinone to 0.125. Cont inv antibiotics  patient examined and medical record reviewed,agree with above note. PTharon AquasTrigt III 03/07/2020

## 2020-03-07 NOTE — Progress Notes (Signed)
Metaneb not performed at this time. Pt refused metaneb and breathing treatments. No distress noted. Pt with good productive cough. RT will continue to monitor.

## 2020-03-08 LAB — BASIC METABOLIC PANEL
Anion gap: 10 (ref 5–15)
BUN: 28 mg/dL — ABNORMAL HIGH (ref 6–20)
CO2: 25 mmol/L (ref 22–32)
Calcium: 8.6 mg/dL — ABNORMAL LOW (ref 8.9–10.3)
Chloride: 100 mmol/L (ref 98–111)
Creatinine, Ser: 1.24 mg/dL (ref 0.61–1.24)
GFR calc Af Amer: >60 mL/min (ref >60)
GFR calc non Af Amer: >60 mL/min (ref >60)
Glucose, Bld: 132 mg/dL — ABNORMAL HIGH (ref 70–99)
Potassium: 3.7 mmol/L (ref 3.5–5.1)
Sodium: 135 mmol/L (ref 135–145)

## 2020-03-08 LAB — COOXEMETRY PANEL
Carboxyhemoglobin: 1.3 % (ref 0.5–1.5)
Methemoglobin: 1.4 % (ref 0.0–1.5)
O2 Saturation: 65.6 %
Total hemoglobin: 7.3 g/dL — ABNORMAL LOW (ref 12.0–16.0)

## 2020-03-08 LAB — CBC
HCT: 22.9 % — ABNORMAL LOW (ref 39.0–52.0)
Hemoglobin: 7.3 g/dL — ABNORMAL LOW (ref 13.0–17.0)
MCH: 29.8 pg (ref 26.0–34.0)
MCHC: 31.9 g/dL (ref 30.0–36.0)
MCV: 93.5 fL (ref 80.0–100.0)
Platelets: 153 10*3/uL (ref 150–400)
RBC: 2.45 MIL/uL — ABNORMAL LOW (ref 4.22–5.81)
RDW: 13.4 % (ref 11.5–15.5)
WBC: 15 10*3/uL — ABNORMAL HIGH (ref 4.0–10.5)
nRBC: 0.5 % — ABNORMAL HIGH (ref 0.0–0.2)

## 2020-03-08 LAB — GLUCOSE, CAPILLARY
Glucose-Capillary: 151 mg/dL — ABNORMAL HIGH (ref 70–99)
Glucose-Capillary: 120 mg/dL — ABNORMAL HIGH (ref 70–99)
Glucose-Capillary: 125 mg/dL — ABNORMAL HIGH (ref 70–99)
Glucose-Capillary: 125 mg/dL — ABNORMAL HIGH (ref 70–99)
Glucose-Capillary: 132 mg/dL — ABNORMAL HIGH (ref 70–99)

## 2020-03-08 LAB — PREPARE RBC (CROSSMATCH)

## 2020-03-08 MED ORDER — POTASSIUM CHLORIDE CRYS ER 20 MEQ PO TBCR
20.0000 meq | EXTENDED_RELEASE_TABLET | Freq: Two times a day (BID) | ORAL | Status: DC
  Administered 2020-03-08: 20 meq via ORAL
  Filled 2020-03-08: qty 1

## 2020-03-08 MED ORDER — POTASSIUM CHLORIDE CRYS ER 20 MEQ PO TBCR
20.0000 meq | EXTENDED_RELEASE_TABLET | ORAL | Status: AC
  Administered 2020-03-08 (×3): 20 meq via ORAL
  Filled 2020-03-08 (×3): qty 1

## 2020-03-08 NOTE — Progress Notes (Signed)
CARDIAC REHAB PHASE I   PRE:  Rate/Rhythm: 80 apacing    BP: sitting 139/61    SaO2: 97 10 L  MODE:  Ambulation: 370 ft   POST:  Rate/Rhythm: 80 pacing    BP: sitting 177/81     SaO2: 83 15L  Pt up in recliner, eager to walk. Stood well, used Probation officer, assist x2 standby for equipment, and began on 10 L. Pt SaO2 86 after 120 ft therefore increased to 15L HFNC. SaO2 slow to increase, up to 88 15 L. However by end of walk SaO2 83 15L. Pt not overtly SOB, sts he feels fairly well. Return to recliner. Encouraged IS, flutter and another walk later. [place], ACSM 03/08/2020 2:15 PM

## 2020-03-08 NOTE — Progress Notes (Signed)
5 Days Post-Op Procedure(s) (LRB): AORTIC VALVE REPLACEMENT (AVR) Inspiris 83m (N/A) CORONARY ARTERY BYPASS GRAFTING (CABG) x3. Endoscopic saphenous vein harvest   (N/A) TRANSESOPHAGEAL ECHOCARDIOGRAM (TEE) (N/A) FLEXIBLE BRONCHOSCOPY (N/A) Subjective: Pain R ankle  [prob gout] better NSR a-pacing Co-ox > .60 milrinone off Will move to 2 C  Objective: Vital signs in last 24 hours: Temp:  [97.6 F (36.4 C)-99.3 F (37.4 C)] 97.6 F (36.4 C) (07/07 0650) Pulse Rate:  [78-197] 79 (07/07 0500) Cardiac Rhythm: Atrial paced (07/07 0400) Resp:  [12-26] 17 (07/07 0500) BP: (98-157)/(56-109) 136/71 (07/07 0500) SpO2:  [91 %-99 %] 95 % (07/07 0500) Weight:  [91.7 kg] 91.7 kg (07/07 0600)  Hemodynamic parameters for last 24 hours: CVP:  [13 mmHg-20 mmHg] 15 mmHg  Intake/Output from previous day: 07/06 0701 - 07/07 0700 In: 962.4 [P.O.:680; I.V.:182.4; IV Piggyback:100] Out: 600 [Urine:600] Intake/Output this shift: No intake/output data recorded.       Exam    General- alert and comfortable    Neck- no JVD, no cervical adenopathy palpable, no carotid bruit   Lungs- clear without rales, wheezes   Cor- regular rate and rhythm, no murmur , gallop   Abdomen- soft, non-tender   Extremities - warm, non-tender, minimal edema   Neuro- oriented, appropriate, no focal weakness   Lab Results: Recent Labs    03/07/20 0355 03/08/20 0346  WBC 19.5* 15.0*  HGB 7.4* 7.3*  HCT 23.6* 22.9*  PLT 145* 153   BMET:  Recent Labs    03/07/20 1700 03/08/20 0346  NA 134* 135  K 4.1 3.7  CL 98 100  CO2 23 25  GLUCOSE 222* 132*  BUN 26* 28*  CREATININE 1.38* 1.24  CALCIUM 8.4* 8.6*    PT/INR: No results for input(s): LABPROT, INR in the last 72 hours. ABG    Component Value Date/Time   PHART 7.368 03/04/2020 1650   HCO3 21.3 03/04/2020 1650   TCO2 22 03/04/2020 1650   ACIDBASEDEF 3.0 (H) 03/04/2020 1650   O2SAT 65.6 03/08/2020 0455   CBG (last 3)  Recent Labs     03/07/20 1501 03/07/20 2156 03/08/20 0649  GLUCAP 147* 134* 151*    Assessment/Plan: S/P Procedure(s) (LRB): AORTIC VALVE REPLACEMENT (AVR) Inspiris 275m(N/A) CORONARY ARTERY BYPASS GRAFTING (CABG) x3. Endoscopic saphenous vein harvest   (N/A) TRANSESOPHAGEAL ECHOCARDIOGRAM (TEE) (N/A) FLEXIBLE BRONCHOSCOPY (N/A) Expected postop anemia but Hb 7.2 despite iv and po iron 1 U PRBCs today Tx 2 C  LOS: 5 days    PeTharon Aquasrigt III 03/08/2020

## 2020-03-09 ENCOUNTER — Inpatient Hospital Stay (HOSPITAL_COMMUNITY): Payer: Commercial Managed Care - PPO

## 2020-03-09 LAB — TYPE AND SCREEN
ABO/RH(D): O POS
Antibody Screen: NEGATIVE
Unit division: 0

## 2020-03-09 LAB — CBC
HCT: 27.4 % — ABNORMAL LOW (ref 39.0–52.0)
Hemoglobin: 8.5 g/dL — ABNORMAL LOW (ref 13.0–17.0)
MCH: 29.7 pg (ref 26.0–34.0)
MCHC: 31 g/dL (ref 30.0–36.0)
MCV: 95.8 fL (ref 80.0–100.0)
Platelets: 197 10*3/uL (ref 150–400)
RBC: 2.86 MIL/uL — ABNORMAL LOW (ref 4.22–5.81)
RDW: 14.6 % (ref 11.5–15.5)
WBC: 14.4 10*3/uL — ABNORMAL HIGH (ref 4.0–10.5)
nRBC: 0.8 % — ABNORMAL HIGH (ref 0.0–0.2)

## 2020-03-09 LAB — BASIC METABOLIC PANEL
Anion gap: 11 (ref 5–15)
BUN: 33 mg/dL — ABNORMAL HIGH (ref 6–20)
CO2: 22 mmol/L (ref 22–32)
Calcium: 8.9 mg/dL (ref 8.9–10.3)
Chloride: 105 mmol/L (ref 98–111)
Creatinine, Ser: 1.25 mg/dL — ABNORMAL HIGH (ref 0.61–1.24)
GFR calc Af Amer: >60 mL/min (ref >60)
GFR calc non Af Amer: >60 mL/min (ref >60)
Glucose, Bld: 113 mg/dL — ABNORMAL HIGH (ref 70–99)
Potassium: 4.7 mmol/L (ref 3.5–5.1)
Sodium: 138 mmol/L (ref 135–145)

## 2020-03-09 LAB — COOXEMETRY PANEL
Carboxyhemoglobin: 1.1 % (ref 0.5–1.5)
Methemoglobin: 1.4 % (ref 0.0–1.5)
O2 Saturation: 49.9 %
Total hemoglobin: 9.3 g/dL — ABNORMAL LOW (ref 12.0–16.0)

## 2020-03-09 LAB — BPAM RBC
Blood Product Expiration Date: 202108052359
ISSUE DATE / TIME: 202107071208
Unit Type and Rh: 5100

## 2020-03-09 LAB — GLUCOSE, CAPILLARY
Glucose-Capillary: 115 mg/dL — ABNORMAL HIGH (ref 70–99)
Glucose-Capillary: 165 mg/dL — ABNORMAL HIGH (ref 70–99)
Glucose-Capillary: 89 mg/dL (ref 70–99)
Glucose-Capillary: 109 mg/dL — ABNORMAL HIGH (ref 70–99)

## 2020-03-09 MED ORDER — LOSARTAN POTASSIUM 25 MG PO TABS
25.0000 mg | ORAL_TABLET | Freq: Every day | ORAL | Status: DC
  Administered 2020-03-09 – 2020-03-10 (×2): 25 mg via ORAL
  Filled 2020-03-09 (×2): qty 1

## 2020-03-09 MED ORDER — AMIODARONE HCL 200 MG PO TABS
200.0000 mg | ORAL_TABLET | Freq: Two times a day (BID) | ORAL | Status: DC
  Administered 2020-03-09 – 2020-03-12 (×7): 200 mg via ORAL
  Filled 2020-03-09 (×7): qty 1

## 2020-03-09 MED ORDER — CARVEDILOL 3.125 MG PO TABS
3.1250 mg | ORAL_TABLET | Freq: Two times a day (BID) | ORAL | Status: DC
  Administered 2020-03-09 – 2020-03-12 (×6): 3.125 mg via ORAL
  Filled 2020-03-09 (×6): qty 1

## 2020-03-09 NOTE — Progress Notes (Signed)
      [address]       [place] [address]             [phone]      POD # 6 AVR CABG  BP (!) 141/62   Pulse 66   Temp 98.5 F (36.9 C) (Oral)   Resp 20   Ht 5\' 6"  (1.676 m)   Wt 90.9 kg   SpO2 97%   BMI 32.35 kg/m  On 6L HFNC  Intake/Output Summary (Last 24 hours) at 03/09/2020 1814 Last data filed at 03/09/2020 1317 Gross per 24 hour  Intake 570.07 ml  Output 1075 ml  Net -504.93 ml   CBG well controlled, no other PM labs Check co-ox in AM  Stevana Dufner C. Roxan Hockey, MD [facility] [phone]

## 2020-03-09 NOTE — Progress Notes (Signed)
CARDIAC REHAB PHASE I   PRE:  Rate/Rhythm: 69 SR    BP: sitting 177/70    SaO2: 94 10L  MODE:  Ambulation: 370 ft   POST:  Rate/Rhythm: 90 SR    BP: sitting 175/70     SaO2: 89 10L  Pt walked with RW, on 10L O2. Steady. C/o right foot pain. He needs reminders to practice pursed lip breathing and to rest at times. SAO2 maintained 89 10L, better than yesterday. Encouraged IS and more walking. Will f/u tomorrow. [phone]   Darrick Meigs CES, ACSM 03/09/2020 11:01 AM

## 2020-03-09 NOTE — Progress Notes (Addendum)
[address]       [place] [address]             [phone]      6 Days Post-Op Procedure(s) (LRB): AORTIC VALVE REPLACEMENT (AVR) Inspiris 80m (N/A) CORONARY ARTERY BYPASS GRAFTING (CABG) x3. Endoscopic saphenous vein harvest   (N/A) TRANSESOPHAGEAL ECHOCARDIOGRAM (TEE) (N/A) FLEXIBLE BRONCHOSCOPY (N/A) Subjective: Feels okay this morning. His appetite is coming back.   Objective: Vital signs in last 24 hours: Temp:  [97.8 F (36.6 C)-98.8 F (37.1 C)] 97.8 F (36.6 C) (07/08 0400) Pulse Rate:  [56-109] 74 (07/08 0700) Cardiac Rhythm: Sinus bradycardia (07/08 0400) Resp:  [14-30] 24 (07/08 0700) BP: (108-177)/(60-118) 137/118 (07/08 0700) SpO2:  [87 %-97 %] 89 % (07/08 0700) Weight:  [90.9 kg] 90.9 kg (07/08 0500)  Hemodynamic parameters for last 24 hours: CVP:  [12 mmHg-21 mmHg] 18 mmHg  Intake/Output from previous day: 07/07 0701 - 07/08 0700 In: 648.4 [P.O.:120; I.V.:103.3; Blood:325; IV Piggyback:100.1] Out: 16384[Urine:1825] Intake/Output this shift: No intake/output data recorded.  General appearance: alert, cooperative and no distress Heart: regular rate and rhythm, S1, S2 normal, no murmur, click, rub or gallop Lungs: clear to auscultation bilaterally Abdomen: soft, non-tender; bowel sounds normal; no masses,  no organomegaly Extremities: extremities normal, atraumatic, no cyanosis or edema Wound: clean and dry, chest tube sites covered with some drainage  Lab Results: Recent Labs    03/08/20 0346 03/09/20 0500  WBC 15.0* 14.4*  HGB 7.3* 8.5*  HCT 22.9* 27.4*  PLT 153 197   BMET:  Recent Labs    03/08/20 0346 03/09/20 0500  NA 135 138  K 3.7 4.7  CL 100 105  CO2 25 22  GLUCOSE 132* 113*  BUN 28* 33*  CREATININE 1.24 1.25*  CALCIUM 8.6* 8.9    PT/INR: No results for input(s): LABPROT, INR in the last 72 hours. ABG    Component Value Date/Time   PHART 7.368 03/04/2020 1650   HCO3 21.3 03/04/2020 1650   TCO2  22 03/04/2020 1650   ACIDBASEDEF 3.0 (H) 03/04/2020 1650   O2SAT 49.9 03/09/2020 0445   CBG (last 3)  Recent Labs    03/08/20 1953 03/08/20 2301 03/09/20 0642  GLUCAP 125* 132* 115*    Assessment/Plan: S/P Procedure(s) (LRB): AORTIC VALVE REPLACEMENT (AVR) Inspiris 252m(N/A) CORONARY ARTERY BYPASS GRAFTING (CABG) x3. Endoscopic saphenous vein harvest   (N/A) TRANSESOPHAGEAL ECHOCARDIOGRAM (TEE) (N/A) FLEXIBLE BRONCHOSCOPY (N/A)   1. CV-NSR in the 60s. Hypertensive at times. Post-op afib, continue oral Amio. BP well controlled. coox 49.9 Off milrinone.  2. Pulm-CPAP at night.  10L HFNC. CXR stable, no pneumo. Off lasix drip. He is on xopenex and mucinex. Continue pulmonary toilet.  3. Renal-creatinine 1.25, electrolytes okay 4. H and H 8.5/27.4,  S/p 1 unit pRBC, expected acute blood loss anemia 5. Endo-blood glucose well controlled 6. WBC 14.4, continue Rocephin IV  Plan: May need to restart milrinone. HF consult? Hypertensive at times and was on several agents at home. Weaning high flow as able. Continue to ambulate in the halls. WBC trending down.    LOS: 6 days    TeElgie Collard/04/2020  milrinone off - recheck coox in am  still with low O2 sats at night- needs Bipap HR slow sinus- reduce dose of amio and coreg Keep in ICU for pulmonary support- on hi flow O2 and Bipap PRN patient examined and medical record reviewed,agree with above note. PeTharon Aquasrigt III  03/09/2020   

## 2020-03-09 NOTE — Discharge Instructions (Signed)
Discharge Instructions:  1. You may shower, please wash incisions daily with soap and water and keep dry.  If you wish to cover wounds with dressing you may do so but please keep clean and change daily.  No tub baths or swimming until incisions have completely healed.  If your incisions become red or develop any drainage please call our office at [phone]  2. No Driving until cleared by [facility] office and you are no longer using narcotic pain medications  3. Monitor your weight daily.. Please use the same scale and weigh at same time... If you gain 3-5 lbs in 48 hours with associated lower extremity swelling, please contact our office at [phone]  4. Fever of 101.5 for at least 24 hours with no source, please contact our office at [phone]  5. Activity- up as tolerated, please walk at least 3 times per day.  Avoid strenuous activity, no lifting, pushing, or pulling with your arms over 8-10 lbs for a minimum of 6 weeks  6. If any questions or concerns arise, please do not hesitate to contact our office at [phone]

## 2020-03-10 ENCOUNTER — Inpatient Hospital Stay (HOSPITAL_COMMUNITY): Payer: Commercial Managed Care - PPO

## 2020-03-10 LAB — BASIC METABOLIC PANEL
Anion gap: 10 (ref 5–15)
BUN: 28 mg/dL — ABNORMAL HIGH (ref 6–20)
CO2: 25 mmol/L (ref 22–32)
Calcium: 8.9 mg/dL (ref 8.9–10.3)
Chloride: 105 mmol/L (ref 98–111)
Creatinine, Ser: 1.14 mg/dL (ref 0.61–1.24)
GFR calc Af Amer: >60 mL/min (ref >60)
GFR calc non Af Amer: >60 mL/min (ref >60)
Glucose, Bld: 97 mg/dL (ref 70–99)
Potassium: 3.8 mmol/L (ref 3.5–5.1)
Sodium: 140 mmol/L (ref 135–145)

## 2020-03-10 LAB — COOXEMETRY PANEL
Carboxyhemoglobin: 0.9 % (ref 0.5–1.5)
Methemoglobin: 0.6 % (ref 0.0–1.5)
O2 Saturation: 54.7 %
Total hemoglobin: 11.3 g/dL — ABNORMAL LOW (ref 12.0–16.0)

## 2020-03-10 LAB — CBC
HCT: 27.1 % — ABNORMAL LOW (ref 39.0–52.0)
Hemoglobin: 8 g/dL — ABNORMAL LOW (ref 13.0–17.0)
MCH: 28 pg (ref 26.0–34.0)
MCHC: 29.5 g/dL — ABNORMAL LOW (ref 30.0–36.0)
MCV: 94.8 fL (ref 80.0–100.0)
Platelets: 217 10*3/uL (ref 150–400)
RBC: 2.86 MIL/uL — ABNORMAL LOW (ref 4.22–5.81)
RDW: 14.5 % (ref 11.5–15.5)
WBC: 13.1 10*3/uL — ABNORMAL HIGH (ref 4.0–10.5)
nRBC: 0.4 % — ABNORMAL HIGH (ref 0.0–0.2)

## 2020-03-10 LAB — GLUCOSE, CAPILLARY
Glucose-Capillary: 96 mg/dL (ref 70–99)
Glucose-Capillary: 129 mg/dL — ABNORMAL HIGH (ref 70–99)
Glucose-Capillary: 124 mg/dL — ABNORMAL HIGH (ref 70–99)
Glucose-Capillary: 129 mg/dL — ABNORMAL HIGH (ref 70–99)

## 2020-03-10 MED ORDER — METOLAZONE 5 MG PO TABS
5.0000 mg | ORAL_TABLET | Freq: Every morning | ORAL | Status: DC
  Administered 2020-03-10: 5 mg via ORAL
  Filled 2020-03-10: qty 1

## 2020-03-10 MED ORDER — POTASSIUM CHLORIDE CRYS ER 20 MEQ PO TBCR
20.0000 meq | EXTENDED_RELEASE_TABLET | Freq: Two times a day (BID) | ORAL | Status: DC
  Administered 2020-03-10 – 2020-03-12 (×5): 20 meq via ORAL
  Filled 2020-03-10 (×5): qty 1

## 2020-03-10 MED ORDER — GUAIFENESIN-DM 100-10 MG/5ML PO SYRP
5.0000 mL | ORAL_SOLUTION | ORAL | Status: DC | PRN
  Administered 2020-03-10: 5 mL via ORAL
  Filled 2020-03-10: qty 5

## 2020-03-10 MED ORDER — GUAIFENESIN 100 MG/5ML PO SOLN: 5.0000 mL | ORAL | Status: DC | PRN

## 2020-03-10 MED ORDER — INSULIN ASPART 100 UNIT/ML ~~LOC~~ SOLN: 0.0000 [IU] | Freq: Three times a day (TID) | SUBCUTANEOUS | Status: DC

## 2020-03-10 NOTE — Progress Notes (Addendum)
[facility] DAILY ICU PROGRESS NOTE                   [address]            [place] [address]          [phone]   7 Days Post-Op Procedure(s) (LRB): AORTIC VALVE REPLACEMENT (AVR) Inspiris 11m (N/A) CORONARY ARTERY BYPASS GRAFTING (CABG) x3. Endoscopic saphenous vein harvest   (N/A) TRANSESOPHAGEAL ECHOCARDIOGRAM (TEE) (N/A) FLEXIBLE BRONCHOSCOPY (N/A)  Total Length of Stay:  LOS: 7 days   Subjective: Feels good this morning. No complaints.   Objective: Vital signs in last 24 hours: Temp:  [98.1 F (36.7 C)-98.9 F (37.2 C)] 98.5 F (36.9 C) (07/09 0430) Pulse Rate:  [65-81] 78 (07/09 0500) Cardiac Rhythm: Normal sinus rhythm (07/09 0430) Resp:  [13-23] 15 (07/09 0500) BP: (110-172)/(58-101) 148/73 (07/09 0500) SpO2:  [92 %-98 %] 98 % (07/09 0500) Weight:  [90.4 kg] 90.4 kg (07/09 0423)  Filed Weights   03/08/20 0600 03/09/20 0500 03/10/20 0423  Weight: 91.7 kg 90.9 kg 90.4 kg    Weight change: -0.5 kg   Hemodynamic parameters for last 24 hours: CVP:  [14 mmHg] 14 mmHg  Intake/Output from previous day: 07/08 0701 - 07/09 0700 In: 450 [P.O.:350; IV Piggyback:100] Out: 802 [Urine:801; Stool:1]  Intake/Output this shift: No intake/output data recorded.  Current Meds: Scheduled Meds: . sodium chloride   Intravenous Once  . sodium chloride   Intravenous Once  . amiodarone  200 mg Oral BID  . aspirin EC  325 mg Oral Daily   Or  . aspirin  324 mg Per Tube Daily  . bisacodyl  10 mg Oral Daily   Or  . bisacodyl  10 mg Rectal Daily  . carvedilol  3.125 mg Oral BID WC  . chlorhexidine  15 mL Mouth Rinse BID  . Chlorhexidine Gluconate Cloth  6 each Topical Daily  . colchicine  0.6 mg Oral BID  . docusate sodium  200 mg Oral Daily  . enoxaparin (LOVENOX) injection  40 mg Subcutaneous Q24H  . ferrous [id number]-[id number]-folic acid  1 capsule Oral TID PC  . furosemide  40 mg Intravenous Daily  . guaiFENesin  600 mg Oral BID  . insulin aspart   0-24 Units Subcutaneous TID WC & HS  . insulin detemir  10 Units Subcutaneous BID  . losartan  25 mg Oral Daily  . mouth rinse  15 mL Mouth Rinse q12n4p  . pantoprazole  40 mg Oral Daily  . simethicone  80 mg Oral QID  . simvastatin  20 mg Oral QPM  . sodium chloride flush  10-40 mL Intracatheter Q12H  . sodium chloride flush  3 mL Intravenous Q12H   Continuous Infusions: . sodium chloride Stopped (03/05/20 1741)  . sodium chloride    . sodium chloride 20 mL/hr at 03/08/20 0800  . cefTRIAXone (ROCEPHIN)  IV Stopped (03/09/20 2257)   PRN Meds:.sodium chloride, levalbuterol, metoprolol tartrate, ondansetron (ZOFRAN) IV, oxyCODONE, sodium chloride flush, sodium chloride flush, traMADol  General appearance: alert, cooperative and no distress Heart: regular rate and rhythm, S1, S2 normal, no murmur, click, rub or gallop Lungs: clear to auscultation bilaterally and diminished in the lower lobes Abdomen: soft, non-tender; bowel sounds normal; no masses,  no organomegaly Extremities: extremities normal, atraumatic, no cyanosis or edema Wound: clean and dry  Lab Results: CBC: Recent Labs    03/09/20 0500 03/10/20 0235  WBC 14.4* 13.1*  HGB 8.5*  8.0*  HCT 27.4* 27.1*  PLT 197 217   BMET:  Recent Labs    03/09/20 0500 03/10/20 0235  NA 138 140  K 4.7 3.8  CL 105 105  CO2 22 25  GLUCOSE 113* 97  BUN 33* 28*  CREATININE 1.25* 1.14  CALCIUM 8.9 8.9    CMET: Lab Results  Component Value Date   WBC 13.1 (H) 03/10/2020   HGB 8.0 (L) 03/10/2020   HCT 27.1 (L) 03/10/2020   PLT 217 03/10/2020   GLUCOSE 97 03/10/2020   ALT 35 03/02/2020   AST 28 03/02/2020   NA 140 03/10/2020   K 3.8 03/10/2020   CL 105 03/10/2020   CREATININE 1.14 03/10/2020   BUN 28 (H) 03/10/2020   CO2 25 03/10/2020   INR 1.3 (H) 03/03/2020   HGBA1C 6.1 (H) 03/02/2020      PT/INR: No results for input(s): LABPROT, INR in the last 72 hours. Radiology: No results found.   Assessment/Plan: S/P  Procedure(s) (LRB): AORTIC VALVE REPLACEMENT (AVR) Inspiris 85m (N/A) CORONARY ARTERY BYPASS GRAFTING (CABG) x3. Endoscopic saphenous vein harvest   (N/A) TRANSESOPHAGEAL ECHOCARDIOGRAM (TEE) (N/A) FLEXIBLE BRONCHOSCOPY (N/A)  1. CV- NSR in the 70s, BP better controlled. Continue Amio 2058mBID, asa, cozaar, and low dose coreg. coox improved this morning at 54.7. off milrinone.  2. Pulm- tolerating HFNC at 6L. CXR stable, no pneumo, small bilateral pleural effusions.Continue lasix 4068mV daily.  He is on xopenex and mucinex. Continue pulmonary toilet.  3. Renal-creatinine 1.14, electrolytes okay 4. H and H 8.0/27.1,  S/p 1 unit pRBC on 7/7, expected acute blood loss anemia 5. Endo-blood glucose well controlled 6. WBC 13.1, continue Rocephin IV 7. + BM  Plan: Hopeful transfer to 4E if bed available. Continued to encourage ambulation TID. Continue IS/flutter valve and weaning of supplemental oxygen.    TesElgie Collard9/2021 7:29 AM\ Tx to 4E Remove EPWs in am Home on po lasix, no coumadin  patient examined and medical record reviewed,agree with above note. PetTharon Aquasigt III 03/10/2020

## 2020-03-10 NOTE — Progress Notes (Signed)
Mobility Specialist: Progress Note    03/10/20 1602  Mobility  Activity Ambulated in hall  Level of Assistance Modified independent, requires aide device or extra time  Assistive Device Four wheel walker  Distance Ambulated (ft) 980 ft  Mobility Response Tolerated well  Mobility performed by Mobility specialist  Bed Position Chair  $Mobility charge 1 Mobility   Pre-Mobility: 80 HR, 125/71 BP, 96% SpO2 During Mobility: 95 HR, 97% SpO2 Post-Mobility: 101 HR, 169/73 BP, 97% SpO2  Pt started out on 4L/min [place]. Throughout walk we stopped to check his SpO2, which maintained mid to high 90s even after dropping down to 1L/min [place]. Pt had no c/o SOB but did say his legs felt a little weak.   [facility] Styles Fambro Mobility Specialist

## 2020-03-10 NOTE — Progress Notes (Signed)
CARDIAC REHAB PHASE I   PRE:  Rate/Rhythm: 75 SR    BP: sitting 128/69    SaO2: 94 1 1/2L  MODE:  Ambulation: 470 ft   POST:  Rate/Rhythm: 94 SR    BP: sitting 167/88     SaO2: 92 4L  Pt SaO2 much improved. Able to walk on 4L (pts request) and with RW. SaO2 maintained mid 90s during walk.  Return to recliner. Encouraged IS, flutter, and more walking. He can walk with his wife if he would like. [place], ACSM 03/10/2020 1:50 PM

## 2020-03-11 ENCOUNTER — Inpatient Hospital Stay (HOSPITAL_COMMUNITY): Payer: Commercial Managed Care - PPO

## 2020-03-11 LAB — CBC
HCT: 30.2 % — ABNORMAL LOW (ref 39.0–52.0)
Hemoglobin: 9.4 g/dL — ABNORMAL LOW (ref 13.0–17.0)
MCH: 29.7 pg (ref 26.0–34.0)
MCHC: 31.1 g/dL (ref 30.0–36.0)
MCV: 95.6 fL (ref 80.0–100.0)
Platelets: 316 10*3/uL (ref 150–400)
RBC: 3.16 MIL/uL — ABNORMAL LOW (ref 4.22–5.81)
RDW: 15.3 % (ref 11.5–15.5)
WBC: 18.3 10*3/uL — ABNORMAL HIGH (ref 4.0–10.5)
nRBC: 0.2 % (ref 0.0–0.2)

## 2020-03-11 LAB — URINALYSIS, ROUTINE W REFLEX MICROSCOPIC
Bilirubin Urine: NEGATIVE
Glucose, UA: NEGATIVE mg/dL
Ketones, ur: NEGATIVE mg/dL
Leukocytes,Ua: NEGATIVE
Nitrite: NEGATIVE
Protein, ur: NEGATIVE mg/dL
Specific Gravity, Urine: 1.021 (ref 1.005–1.030)
pH: 5 (ref 5.0–8.0)

## 2020-03-11 LAB — BASIC METABOLIC PANEL
Anion gap: 12 (ref 5–15)
BUN: 26 mg/dL — ABNORMAL HIGH (ref 6–20)
CO2: 23 mmol/L (ref 22–32)
Calcium: 9.1 mg/dL (ref 8.9–10.3)
Chloride: 104 mmol/L (ref 98–111)
Creatinine, Ser: 1.33 mg/dL — ABNORMAL HIGH (ref 0.61–1.24)
GFR calc Af Amer: >60 mL/min (ref >60)
GFR calc non Af Amer: 59 mL/min — ABNORMAL LOW (ref >60)
Glucose, Bld: 114 mg/dL — ABNORMAL HIGH (ref 70–99)
Potassium: 3.8 mmol/L (ref 3.5–5.1)
Sodium: 139 mmol/L (ref 135–145)

## 2020-03-11 LAB — GLUCOSE, CAPILLARY
Glucose-Capillary: 95 mg/dL (ref 70–99)
Glucose-Capillary: 94 mg/dL (ref 70–99)
Glucose-Capillary: 114 mg/dL — ABNORMAL HIGH (ref 70–99)
Glucose-Capillary: 117 mg/dL — ABNORMAL HIGH (ref 70–99)

## 2020-03-11 MED ORDER — LOSARTAN POTASSIUM 25 MG PO TABS
25.0000 mg | ORAL_TABLET | Freq: Every day | ORAL | Status: DC
  Administered 2020-03-12: 25 mg via ORAL
  Filled 2020-03-11: qty 1

## 2020-03-11 MED ORDER — FUROSEMIDE 40 MG PO TABS: 40.0000 mg | ORAL_TABLET | Freq: Every day | ORAL | Status: DC

## 2020-03-11 MED ORDER — FUROSEMIDE 40 MG PO TABS
40.0000 mg | ORAL_TABLET | Freq: Every day | ORAL | Status: DC
  Administered 2020-03-12: 40 mg via ORAL
  Filled 2020-03-11: qty 1

## 2020-03-11 MED ORDER — ACETAMINOPHEN 325 MG PO TABS
650.0000 mg | ORAL_TABLET | Freq: Four times a day (QID) | ORAL | Status: DC | PRN
  Administered 2020-03-11: 650 mg via ORAL
  Filled 2020-03-11: qty 2

## 2020-03-11 NOTE — Progress Notes (Addendum)
      [address]       [place] [address]             [phone]        8 Days Post-Op Procedure(s) (LRB): AORTIC VALVE REPLACEMENT (AVR) Inspiris 28m (N/A) CORONARY ARTERY BYPASS GRAFTING (CABG) x3. Endoscopic saphenous vein harvest   (N/A) TRANSESOPHAGEAL ECHOCARDIOGRAM (TEE) (N/A) FLEXIBLE BRONCHOSCOPY (N/A)  Subjective: Patient sitting in chair. He has no specific complaint this am.  Objective: Vital signs in last 24 hours: Temp:  [97.8 F (36.6 C)-98.4 F (36.9 C)] 98.2 F (36.8 C) (07/10 0319) Pulse Rate:  [68-89] 74 (07/10 0319) Cardiac Rhythm: Normal sinus rhythm (07/09 1900) Resp:  [13-20] 17 (07/10 0319) BP: (119-169)/(61-79) 124/67 (07/10 0319) SpO2:  [92 %-98 %] 92 % (07/10 0319) Weight:  [89.1 kg] 89.1 kg (07/10 0319)  Pre op weight 88.5 kg Current Weight  03/11/20 89.1 kg       Intake/Output from previous day: 07/09 0701 - 07/10 0700 In: 100 [IV Piggyback:100] Out: 200 [Urine:200]   Physical Exam:  Cardiovascular: RRR, murmur heard best along left sternal border (? Flow murmur) Pulmonary: Mostly clear bilaterally Abdomen: Soft, non tender, bowel sounds present. Extremities: Mild bilateral lower extremity edema R>L Wounds: Clean and dry.  No erythema or signs of infection.  Lab Results: CBC: Recent Labs    03/10/20 0235 03/11/20 0336  WBC 13.1* 18.3*  HGB 8.0* 9.4*  HCT 27.1* 30.2*  PLT 217 316   BMET:  Recent Labs    03/10/20 0235 03/11/20 0336  NA 140 139  K 3.8 3.8  CL 105 104  CO2 25 23  GLUCOSE 97 114*  BUN 28* 26*  CREATININE 1.14 1.33*  CALCIUM 8.9 9.1    PT/INR:  Lab Results  Component Value Date   INR 1.3 (H) 03/03/2020   INR 1.4 (H) 03/03/2020   INR 1.7 (H) 03/03/2020   ABG:  INR: Will add last result for INR, ABG once components are confirmed Will add last 4 CBG results once components are confirmed  Assessment/Plan:  1. CV - SR with HR in the 80's. On Amiodarone 200 mg bid,  Carvedilol 3.125 mg bid, Losartan 25 mg daily. Hold Losartan today as creatinine elevated. 2.  Pulmonary - Oxygen requirement decreased to 1 liter of oxygen. Check PA/LAT CXR. Encourage incentive spirometer 3. Mild volume Overload - will stop IV Lasix and start oral in am. Will also stop Metolazone as creatinine increased 4.  Expected post op acute blood loss anemia - H and H this am stable at 9.4 and 30.2. Continue Trinsicon 5. DM-CBGs 124/129/95. On Insulin. Will restart Metformin at discharge. Pre op HGA1C 6.1 6. Supplement potassium 7. Creatinine slightly increased from 1.14 to 1.33 8. ID-on Rocephin, which apparently was started post op by Dr. VPrescott Gumfor increased WBC.  Leukocytosis-WBC this am increased to 18,300. Remains afebrile. No sign of wound infection. CXR yesterday showed atelectasis. Check UA PA/LAT CXR. Will remove PICC line and obtain peripheral. On Colchicine-? pericarditis 9. Remove EPW  Donielle M ZimmermanPA-C 03/11/2020,7:29 AM  Agree with above Doing well. Rocephin started for leukocytosis, patient remains afebrile We will remove PICC line [place]

## 2020-03-11 NOTE — Progress Notes (Signed)
EPW pulled per protocol and as ordered. All ends intact. Slight resistance met with right atrial wire but then was removed. Patient reminded to lie supine approximately one hour. bp 107/70 heart rate 71 will monitor patient. Teresea Donley, Bettina Gavia RN

## 2020-03-11 NOTE — Plan of Care (Signed)
Poc progressing.  

## 2020-03-11 NOTE — Progress Notes (Signed)
Pt walked halls independently this am approx 98ft. No oxygen used, sats mid 90s when back to chair.

## 2020-03-11 NOTE — Progress Notes (Signed)
CARDIAC REHAB PHASE I   PRE:  Rate/Rhythm: 87 NSR    BP: sitting 136/71    SaO2: 96 RA  MODE:  Ambulation: 470 ft   POST:  Rate/Rhythm: 85 NSR    BP: sitting 149/70     SaO2: 91 RA  [id number] Patient ambulated in hallway independently. Denied complaints. O2 saturations decreased slightly with ambulation (see above). Patient denied complaints. Wife at bedside. Reinforced IS use. Initiated conversation on phase 2 CR. Patient wishes referral to be placed to [facility] at discharge. Post ambulation back to chair with call bell and phone in reach and wife at side. Encouraged to ambulate at least 1 or more times today as tolerated.Will continue to follow Monday.   Nicosha Struve Minus Breeding RN, BSN

## 2020-03-11 NOTE — Progress Notes (Signed)
Mobility Specialist: Progress Note    03/11/20 1212  Mobility  Activity Ambulated in hall  Level of Assistance Independent  Assistive Device None  Distance Ambulated (ft) 470 ft  Mobility Response Tolerated well  Mobility performed by Mobility specialist  $Mobility charge 1 Mobility   Pre-Mobility: 69 HR, 125/68 BP, 95% SpO2 During Mobility: 95 HR, 94% SpO2 Post-Mobility: 91 HR, 165/72 BP, 95% SpO2   Psychologist, clinical

## 2020-03-12 LAB — BASIC METABOLIC PANEL
Anion gap: 12 (ref 5–15)
BUN: 23 mg/dL — ABNORMAL HIGH (ref 6–20)
CO2: 24 mmol/L (ref 22–32)
Calcium: 8.9 mg/dL (ref 8.9–10.3)
Chloride: 103 mmol/L (ref 98–111)
Creatinine, Ser: 1.29 mg/dL — ABNORMAL HIGH (ref 0.61–1.24)
GFR calc Af Amer: >60 mL/min (ref >60)
GFR calc non Af Amer: >60 mL/min (ref >60)
Glucose, Bld: 127 mg/dL — ABNORMAL HIGH (ref 70–99)
Potassium: 3.7 mmol/L (ref 3.5–5.1)
Sodium: 139 mmol/L (ref 135–145)

## 2020-03-12 LAB — CBC
HCT: 29.8 % — ABNORMAL LOW (ref 39.0–52.0)
Hemoglobin: 9.3 g/dL — ABNORMAL LOW (ref 13.0–17.0)
MCH: 29.6 pg (ref 26.0–34.0)
MCHC: 31.2 g/dL (ref 30.0–36.0)
MCV: 94.9 fL (ref 80.0–100.0)
Platelets: 351 10*3/uL (ref 150–400)
RBC: 3.14 MIL/uL — ABNORMAL LOW (ref 4.22–5.81)
RDW: 15.2 % (ref 11.5–15.5)
WBC: 15.2 10*3/uL — ABNORMAL HIGH (ref 4.0–10.5)
nRBC: 0 % (ref 0.0–0.2)

## 2020-03-12 LAB — GLUCOSE, CAPILLARY
Glucose-Capillary: 109 mg/dL — ABNORMAL HIGH (ref 70–99)
Glucose-Capillary: 109 mg/dL — ABNORMAL HIGH (ref 70–99)

## 2020-03-12 MED ORDER — FERROUS SULFATE 325 (65 FE) MG PO TABS: 325.0000 mg | ORAL_TABLET | Freq: Every day | ORAL | 3 refills | Status: DC

## 2020-03-12 MED ORDER — LISINOPRIL 20 MG PO TABS: 20.0000 mg | ORAL_TABLET | Freq: Every day | ORAL | 1 refills | Status: DC

## 2020-03-12 MED ORDER — ASPIRIN 325 MG PO TBEC: 325.0000 mg | DELAYED_RELEASE_TABLET | Freq: Every day | ORAL | 0 refills | Status: AC

## 2020-03-12 MED ORDER — OXYCODONE HCL 5 MG PO TABS: 5.0000 mg | ORAL_TABLET | ORAL | 0 refills | Status: DC | PRN

## 2020-03-12 MED ORDER — AMIODARONE HCL 200 MG PO TABS: 200.0000 mg | ORAL_TABLET | Freq: Two times a day (BID) | ORAL | 1 refills | Status: DC

## 2020-03-12 MED ORDER — CARVEDILOL 3.125 MG PO TABS: 3.1250 mg | ORAL_TABLET | Freq: Two times a day (BID) | ORAL | 1 refills | Status: DC

## 2020-03-12 MED ORDER — FUROSEMIDE 40 MG PO TABS: 40.0000 mg | ORAL_TABLET | Freq: Every day | ORAL | 0 refills | Status: DC

## 2020-03-12 MED ORDER — POTASSIUM CHLORIDE CRYS ER 20 MEQ PO TBCR: 20.0000 meq | EXTENDED_RELEASE_TABLET | Freq: Every day | ORAL | 0 refills | Status: DC

## 2020-03-12 MED ORDER — ATORVASTATIN CALCIUM 10 MG PO TABS
10.0000 mg | ORAL_TABLET | Freq: Every day | ORAL | 1 refills | Status: DC
Start: 2020-03-12 — End: 2022-11-17

## 2020-03-12 MED ORDER — ACETAMINOPHEN 325 MG PO TABS: 650.0000 mg | ORAL_TABLET | Freq: Four times a day (QID) | ORAL | Status: DC | PRN

## 2020-03-12 MED ORDER — COLCHICINE 0.6 MG PO TABS: 0.6000 mg | ORAL_TABLET | Freq: Two times a day (BID) | ORAL | 0 refills | Status: DC

## 2020-03-12 MED ORDER — POTASSIUM CHLORIDE CRYS ER 20 MEQ PO TBCR
30.0000 meq | EXTENDED_RELEASE_TABLET | Freq: Once | ORAL | Status: AC
  Administered 2020-03-12: 30 meq via ORAL
  Filled 2020-03-12: qty 1

## 2020-03-12 NOTE — Progress Notes (Addendum)
      [address]       ,[place] [address]             [phone]        9 Days Post-Op Procedure(s) (LRB): AORTIC VALVE REPLACEMENT (AVR) Inspiris 58m (N/A) CORONARY ARTERY BYPASS GRAFTING (CABG) x3. Endoscopic saphenous vein harvest   (N/A) TRANSESOPHAGEAL ECHOCARDIOGRAM (TEE) (N/A) FLEXIBLE BRONCHOSCOPY (N/A)  Subjective: Patient sitting in chair. He has no specific complaint this am. He has already walked several times this am.  Objective: Vital signs in last 24 hours: Temp:  [97.4 F (36.3 C)-98.9 F (37.2 C)] 98.3 F (36.8 C) (07/11 0318) Pulse Rate:  [68-84] 83 (07/11 0318) Cardiac Rhythm: Normal sinus rhythm;Bundle branch block (07/10 1921) Resp:  [11-20] 17 (07/11 0318) BP: (106-125)/(59-70) 106/59 (07/11 0318) SpO2:  [93 %-96 %] 94 % (07/11 0318) Weight:  [89 kg] 89 kg (07/11 0318)  Pre op weight 88.5 kg Current Weight  03/12/20 89 kg       Intake/Output from previous day: 07/10 0701 - 07/11 0700 In: 427[P.O.:360; I.V.:30; IV Piggyback:100] Out: 2 [Urine:1; Stool:1]   Physical Exam:  Cardiovascular: RRR, murmur heard best along left sternal border (? Flow murmur) Pulmonary: Clear bilaterally Abdomen: Soft, non tender, bowel sounds present. Extremities: Mild bilateral lower extremity edema R>L Wounds: Clean and dry.  No erythema or signs of infection.  Lab Results: CBC: Recent Labs    03/11/20 0336 03/12/20 0314  WBC 18.3* 15.2*  HGB 9.4* 9.3*  HCT 30.2* 29.8*  PLT 316 351   BMET:  Recent Labs    03/11/20 0336 03/12/20 0314  NA 139 139  K 3.8 3.7  CL 104 103  CO2 23 24  GLUCOSE 114* 127*  BUN 26* 23*  CREATININE 1.33* 1.29*  CALCIUM 9.1 8.9    PT/INR:  Lab Results  Component Value Date   INR 1.3 (H) 03/03/2020   INR 1.4 (H) 03/03/2020   INR 1.7 (H) 03/03/2020   ABG:  INR: Will add last result for INR, ABG once components are confirmed Will add last 4 CBG results once components are  confirmed  Assessment/Plan:  1. CV - SR with HR in the 70's this am. On Amiodarone 200 mg bid, Carvedilol 3.125 mg bid, Losartan 25 mg daily. Of note, he was on Lisinopril PTA so will stop Losartan and start low dose Lisinopril.  2.  Pulmonary - On room air this am. Encourage incentive spirometer 3. Mild volume Overload - Lasix 40 mg orally.  4.  Expected post op acute blood loss anemia - H and H this am stable at 9.3 and 29.8. Continue Trinsicon 5. DM-CBGs 114/117/109. On Insulin. Will restart Metformin at discharge. Pre op HGA1C 6.1 6. Supplement potassium 7. Creatinine slightly decreased from 1.33 to 1.29 8. ID-on Rocephin, which apparently was started post op by Dr. VPrescott Gumfor increased WBC.  Leukocytosis-WBC this am decreased to 15,200. Remains afebrile. No sign of wound infection. CXR yesterday showed atelectasis and small effusions. UA negative for UTI. ? Etiology for leukocytosis.  No need for further antibioticOn Colchicine-? Pericarditis so will continue at discharge.  9. He was on Zocor PTA but as he is on Amiodarone will change to Lipitor to avoid possible side effects 10. As discussed with Dr. LKipp Brood WBC trending down and no etiology for source. Will discharge  Gillie Fleites M ZimmermanPA-C 03/12/2020,7:51 AM

## 2020-03-12 NOTE — Progress Notes (Signed)
Patient and spouse given discharge instructions, medication list. Personal prescriptions sent to personal pharmacy. Follow up appointments given. IV and tele were dcd. CT sutures removed and steri strips applied. Slight oozing from left chest tube site and dressing applied. Will discharge home as ordered. All questions answered. Transported to exit via wheel chair and nursing staff. Zira Helinski, Bettina Gavia RN

## 2020-03-12 NOTE — Progress Notes (Signed)
Pt ambulated 900 ft without any assistive device. tolerated well. Will continue to monitor.

## 2020-03-13 MED FILL — Electrolyte-R (PH 7.4) Solution: INTRAVENOUS | Qty: 5000 | Status: AC

## 2020-03-13 MED FILL — Heparin Sodium (Porcine) Inj 1000 Unit/ML: INTRAMUSCULAR | Qty: 10 | Status: AC

## 2020-03-13 MED FILL — Sodium Chloride IV Soln 0.9%: INTRAVENOUS | Qty: 2000 | Status: AC

## 2020-03-13 MED FILL — Lidocaine HCl Local Soln Prefilled Syringe 100 MG/5ML (2%): INTRAMUSCULAR | Qty: 5 | Status: AC

## 2020-03-13 MED FILL — Sodium Bicarbonate IV Soln 8.4%: INTRAVENOUS | Qty: 50 | Status: AC

## 2020-03-13 MED FILL — Albumin, Human Inj 5%: INTRAVENOUS | Qty: 500 | Status: AC

## 2020-03-17 ENCOUNTER — Telehealth: Payer: Self-pay

## 2020-03-17 ENCOUNTER — Telehealth (HOSPITAL_COMMUNITY): Payer: Self-pay | Admitting: *Deleted

## 2020-03-17 ENCOUNTER — Other Ambulatory Visit: Payer: Self-pay | Admitting: Physician Assistant

## 2020-03-17 MED ORDER — OXYCODONE HCL 5 MG PO TABS: 5.0000 mg | ORAL_TABLET | Freq: Four times a day (QID) | ORAL | 0 refills | Status: DC | PRN

## 2020-03-17 NOTE — Telephone Encounter (Signed)
Pt called the office this morning to request a refill of Oxycodone. He is s/p AVR/CABG by Dr. Prescott Gum on 03/03/20. Pt was discharged from the hospital on 7/11 with Oxycodone Rx of 30 tabs and he is now out. He is complaining of incisional soreness and wants "something stronger" for the pain. Pt denies acute chest pain and sob. Contacted on call PA, T. Harriet Pho, who states she will call in a refill of Oxycodone to last through this weekend and suggests that pt's pain be evaluated by PA on Monday. Informed pt of refill and asked if he could come in for OV on Monday. He refuses to come in, stating, "well, the pain isn't that bad as long as I'm taking my medication." He also said that he would try to get more pain medication from another doctor. Reiterated that PA advises for him to be evaluated on Monday, but he declines. Advised to call [facility] prn problems/concerns and to seek urgent medical attention for acute chest pain and/or shortness of breath.

## 2020-03-17 NOTE — Progress Notes (Signed)
      [address]       [place] [address]             [phone]       Mr. Alcantar called our office today for an oxycodone refill. He is s/p AVR and CABG x 3 with Dr. Prescott Gum. He went home 5 days ago and already used his 30 tablets of oxycodone. He is having continued pain and is asking for stronger medication. Since this is the strongest pain medication we have, I will give him enough tabs to get through the weekend however, if he is having increased pain he needs to be seen in the clinic.   We will try to get him an appointment on Monday for an evaluation and CXR.    Nicholes Rough, PA-C   New prescription: Oxycodone 5mg  q 6 hours PRN, disp: #10

## 2020-03-20 ENCOUNTER — Telehealth: Payer: Self-pay

## 2020-03-20 NOTE — Telephone Encounter (Signed)
Pt called office this morning to report increased swelling in his R leg and foot. Pt is s/p AVR/CABG x 3 (w/ R saphenous vein harvest) by Dr. Prescott Gum on 03/03/20. Pt denies pain, redness, and warmth to R calf and states there are no s/s of infection to incision site. Pt also denies fever, acute chest pain, and shortness of breath. He does report an occasional cough (w/ small amount of clear sputum), particularly when he uses his incentive spirometer. Per request, pt sent photos of legs and feet. R inner calf w/ incision site intact and without s/s of infection. Extensive bruising noted to R inner thigh and significant edema noted to R lower leg and foot. L leg and foot appear mildly edematous. Pt has lost 2 lbs since hospital d/c. Advised pt to elevate his legs and suggested the use of compression stockings, especially for R leg. Pt instructed to notify [facility] if R leg edema does not improve after these measures and/or if cough worsens.

## 2020-03-23 ENCOUNTER — Telehealth (HOSPITAL_COMMUNITY): Payer: Self-pay

## 2020-03-23 NOTE — Telephone Encounter (Signed)
Faxed referral for Phase II cardiac rehab to [facility].

## 2020-04-04 ENCOUNTER — Other Ambulatory Visit: Payer: Self-pay | Admitting: Cardiothoracic Surgery

## 2020-04-04 DIAGNOSIS — Z951 Presence of aortocoronary bypass graft: Secondary | ICD-10-CM

## 2020-04-05 ENCOUNTER — Other Ambulatory Visit (HOSPITAL_COMMUNITY)
Admission: RE | Admit: 2020-04-05 | Discharge: 2020-04-05 | Disposition: A | Discharge status: Home / Self Care | Payer: Commercial Managed Care - PPO | Source: Ambulatory Visit | Attending: Cardiothoracic Surgery | Admitting: Cardiothoracic Surgery

## 2020-04-05 ENCOUNTER — Other Ambulatory Visit: Payer: Self-pay | Admitting: Cardiothoracic Surgery

## 2020-04-05 ENCOUNTER — Encounter: Payer: Self-pay | Admitting: Cardiothoracic Surgery

## 2020-04-05 ENCOUNTER — Ambulatory Visit
Admission: RE | Admit: 2020-04-05 | Discharge: 2020-04-05 | Disposition: A | Discharge status: Home / Self Care | Payer: Commercial Managed Care - PPO | Source: Ambulatory Visit | Attending: Cardiothoracic Surgery | Admitting: Cardiothoracic Surgery

## 2020-04-05 ENCOUNTER — Ambulatory Visit (INDEPENDENT_AMBULATORY_CARE_PROVIDER_SITE_OTHER): Payer: Self-pay | Admitting: Cardiothoracic Surgery

## 2020-04-05 ENCOUNTER — Other Ambulatory Visit: Payer: Self-pay

## 2020-04-05 ENCOUNTER — Other Ambulatory Visit (HOSPITAL_COMMUNITY): Payer: Self-pay | Admitting: Cardiothoracic Surgery

## 2020-04-05 VITALS — BP 146/84 | HR 74 | Temp 97.3°F | Resp 20 | Ht 66.0 in | Wt 193.6 lb

## 2020-04-05 DIAGNOSIS — Z951 Presence of aortocoronary bypass graft: Secondary | ICD-10-CM

## 2020-04-05 DIAGNOSIS — J90 Pleural effusion, not elsewhere classified: Secondary | ICD-10-CM

## 2020-04-05 DIAGNOSIS — Z20822 Contact with and (suspected) exposure to covid-19: Secondary | ICD-10-CM | POA: Diagnosis not present

## 2020-04-05 LAB — SARS CORONAVIRUS 2 (TAT 6-24 HRS): SARS Coronavirus 2: NEGATIVE

## 2020-04-05 MED ORDER — LISINOPRIL 20 MG PO TABS: 20.0000 mg | ORAL_TABLET | Freq: Every day | ORAL | 3 refills | Status: DC

## 2020-04-05 MED ORDER — FUROSEMIDE 40 MG PO TABS: 40.0000 mg | ORAL_TABLET | Freq: Every day | ORAL | 0 refills | Status: DC

## 2020-04-05 NOTE — Progress Notes (Signed)
PCP is [place] Referring Provider is Isaias Cowman, MD  Chief Complaint  Patient presents with  . Routine Post Op    f/u with chest xray, s/p AVR/ CABG 03/03/20    HPI: Patient presents for 1 month surgical follow-up with chest x-ray after AVR CABG. His postoperative problems included transient atrial fibrillation chemically converted to sinus rhythm with amiodarone, fluid retention, right lower lobe atelectasis from secretions, and trickle pain issues.  He presents today with cough shortness of breath and a moderate left pleural effusion on chest x-ray which is new from his predischarge x-ray.  He has been off oral Lasix for approximately 2 weeks.  No problems with the surgical incisions in his postop surgical pain has improved.  No symptoms of angina and he is walking a mile a day.  His blood pressure is elevated and he will resume his preoperative dose of lisinopril 40 mg daily.  Past Medical History:  Diagnosis Date  . Coronary artery disease   . High cholesterol   . Hypertension   . Myocardial infarct, old   . Plantar fasciitis     Past Surgical History:  Procedure Laterality Date  . AORTIC VALVE REPLACEMENT N/A 03/03/2020   Procedure: AORTIC VALVE REPLACEMENT (AVR) Inspiris 67m;  Surgeon: VIvin Poot MD;  [facility]  Service: Open Heart Surgery;  Laterality: N/A;  . CARDIAC SURGERY    . CORONARY ANGIOPLASTY WITH STENT PLACEMENT    . CORONARY ARTERY BYPASS GRAFT N/A 03/03/2020   Procedure: CORONARY ARTERY BYPASS GRAFTING (CABG) x3. Endoscopic saphenous vein harvest  ;  Surgeon: VIvin Poot MD;  [facility]  Service: Open Heart Surgery;  Laterality: N/A;  . FLEXIBLE BRONCHOSCOPY N/A 03/03/2020   Procedure: FLEXIBLE BRONCHOSCOPY;  Surgeon: VIvin Poot MD;  [facility]  Service: Open Heart Surgery;  Laterality: N/A;  . RIGHT/LEFT HEART CATH AND CORONARY ANGIOGRAPHY N/A 12/31/2017   Procedure: RIGHT/LEFT HEART CATH AND CORONARY  ANGIOGRAPHY;  Surgeon: PIsaias Cowman MD;  [facility];  Service: Cardiovascular;  Laterality: N/A;  . RIGHT/LEFT HEART CATH AND CORONARY ANGIOGRAPHY N/A 02/15/2020   Procedure: RIGHT/LEFT HEART CATH AND CORONARY ANGIOGRAPHY;  Surgeon: PIsaias Cowman MD;  [facility];  Service: Cardiovascular;  Laterality: N/A;  . TEE WITHOUT CARDIOVERSION N/A 03/03/2020   Procedure: TRANSESOPHAGEAL ECHOCARDIOGRAM (TEE);  Surgeon: VPrescott Gum PCollier Salina MD;  [facility]  Service: Open Heart Surgery;  Laterality: N/A;    Family History  Problem Relation Age of Onset  . Gout Brother     Social History Social History   Tobacco Use  . Smoking status: Former Smoker    Types: E-cigarettes  . Smokeless tobacco: Never Used  Vaping Use  . Vaping Use: Former  Substance Use Topics  . Alcohol use: Yes    Alcohol/week: 10.0 standard drinks    Types: 10 Cans of beer per week  . Drug use: Never    Current Outpatient Medications  Medication Sig Dispense Refill  . acetaminophen (TYLENOL) 325 MG tablet Take 2 tablets (650 mg total) by mouth every 6 (six) hours as needed for mild pain or fever.    .Marland Kitchenaspirin EC 325 MG EC tablet Take 1 tablet (325 mg total) by mouth daily. 30 tablet 0  . atorvastatin (LIPITOR) 10 MG tablet Take 1 tablet (10 mg total) by mouth daily. 30 tablet 1  . carvedilol (COREG) 3.125 MG tablet Take 1 tablet (3.125 mg total) by mouth  2 (two) times daily with a meal. 60 tablet 1  . Cholecalciferol 50 MCG (2000 UT) CAPS Take 2,000 Units by mouth daily.     . colchicine 0.6 MG tablet Take 1 tablet (0.6 mg total) by mouth 2 (two) times daily. 60 tablet 0  . ferrous sulfate 325 (65 FE) MG tablet Take 1 tablet (325 mg total) by mouth daily with breakfast. For one month then stop. If develops constipation, may stop sooner 30 tablet 3  . furosemide (LASIX) 40 MG tablet Take 1 tablet (40 mg total) by mouth daily for 14 days. 14 tablet 0  . metFORMIN  (GLUCOPHAGE) 500 MG tablet Take 500 mg by mouth daily.     . Omega-3 Fatty Acids (FISH OIL) 1200 MG CAPS Take 1,200 mg by mouth 2 (two) times a week.     Marland Kitchen omeprazole (PRILOSEC) 20 MG capsule Take 20-40 mg by mouth daily as needed.     Marland Kitchen oxyCODONE (OXY IR/ROXICODONE) 5 MG immediate release tablet Take 1 tablet (5 mg total) by mouth every 6 (six) hours as needed for severe pain. 10 tablet 0  . sildenafil (REVATIO) 20 MG tablet Take 60-80 mg by mouth daily as needed (erectile dysfuntion.).     Marland Kitchen lisinopril (ZESTRIL) 20 MG tablet Take 1 tablet (20 mg total) by mouth daily. Take 2 tablets (40 mg) daily 90 tablet 3   No current facility-administered medications for this visit.    No Known Allergies  Review of Systems  Weight stable No fever Dry cough Some shortness of breath with exertion No difficulty with surgical incisions  BP (!) 146/84 (BP Location: Left Arm, Patient Position: Sitting, Cuff Size: Normal)   Pulse 74   Temp (!) 97.3 F (36.3 C)   Resp 20   Ht _0  (1.676 m)   Wt 193 lb 9.6 oz (87.8 kg)   SpO2 100% Comment: RA  BMI 31.25 kg/m  Physical Exam      Exam    General- alert and comfortable    Neck- no JVD, no cervical adenopathy palpable, no carotid bruit   Lungs- clear without rales, wheezes   Cor- regular rate and rhythm, no murmur , gallop   Abdomen- soft, non-tender   Extremities - warm, non-tender, minimal edema   Neuro- oriented, appropriate, no focal weakness   Diagnostic Tests: Today's chest x-ray personally viewed showing a new left moderate pleural effusion  sternal wires intact and well aligned  Impression: Starting to make progress after AVR for critical aortic stenosis with combined CABG x3.  He will need a left thoracentesis to effectively drain the postoperative left pleural effusion.  This will be scheduled at [facility] with IR using ultrasound guidance.  His medications will be changed to include Stop the amiodarone Increase the lisinopril to 40  mg daily Resume Lasix 40 mg daily for 14 days  Plan: He will return in 1 week with chest x-ray to evaluate the postop left pleural effusion.  He may drive and lift up to 10 pounds.  He is 40-50% recovered from surgery at this point.   Len Childs, MD [facility] [phone]

## 2020-04-06 ENCOUNTER — Encounter: Payer: Commercial Managed Care - PPO | Attending: Cardiology

## 2020-04-06 ENCOUNTER — Other Ambulatory Visit: Payer: Self-pay

## 2020-04-06 DIAGNOSIS — Z951 Presence of aortocoronary bypass graft: Secondary | ICD-10-CM

## 2020-04-06 DIAGNOSIS — Z952 Presence of prosthetic heart valve: Secondary | ICD-10-CM | POA: Insufficient documentation

## 2020-04-06 NOTE — Progress Notes (Signed)
Virtual Visit completed. Patient informed on EP and RD appointment and 6 Minute walk test. Patient also informed of patient health questionnaires on My Chart. Patient Verbalizes understanding. Visit diagnosis can be found in [facility] 03/03/2020.

## 2020-04-07 ENCOUNTER — Ambulatory Visit (HOSPITAL_COMMUNITY)
Admission: RE | Admit: 2020-04-07 | Discharge: 2020-04-07 | Disposition: A | Discharge status: Home / Self Care | Payer: Commercial Managed Care - PPO | Source: Ambulatory Visit | Attending: Cardiothoracic Surgery | Admitting: Cardiothoracic Surgery

## 2020-04-07 ENCOUNTER — Ambulatory Visit (HOSPITAL_COMMUNITY)
Admission: RE | Admit: 2020-04-07 | Discharge: 2020-04-07 | Disposition: A | Discharge status: Home / Self Care | Payer: Commercial Managed Care - PPO | Source: Ambulatory Visit | Attending: Student | Admitting: Student

## 2020-04-07 ENCOUNTER — Other Ambulatory Visit (HOSPITAL_COMMUNITY): Payer: Self-pay | Admitting: Student

## 2020-04-07 DIAGNOSIS — J90 Pleural effusion, not elsewhere classified: Secondary | ICD-10-CM | POA: Diagnosis not present

## 2020-04-07 DIAGNOSIS — Z9889 Other specified postprocedural states: Secondary | ICD-10-CM

## 2020-04-07 DIAGNOSIS — Z20822 Contact with and (suspected) exposure to covid-19: Secondary | ICD-10-CM | POA: Insufficient documentation

## 2020-04-07 DIAGNOSIS — Z951 Presence of aortocoronary bypass graft: Secondary | ICD-10-CM

## 2020-04-07 HISTORY — PX: IR THORACENTESIS ASP PLEURAL SPACE W/IMG GUIDE: IMG5380

## 2020-04-07 MED ORDER — LIDOCAINE HCL 1 % IJ SOLN
INTRAMUSCULAR | Status: DC | PRN
  Administered 2020-04-07: 10 mL

## 2020-04-07 MED ORDER — LIDOCAINE HCL 1 % IJ SOLN
INTRAMUSCULAR | Status: AC
  Filled 2020-04-07: qty 20

## 2020-04-07 NOTE — Procedures (Signed)
PROCEDURE SUMMARY:  Successful US guided left thoracentesis. Yielded 1200 ml of light red fluid. Pt tolerated procedure well. No immediate complications.  Right lower lung field examined with ultrasound per patient request (no images saved) and no fluid amenable to percutaneous drainage was identified.   CXR ordered; no post-procedure pneumothorax  EBL < 5 mL  Theresa Duty, NP 04/07/2020 3:34 PM

## 2020-04-08 ENCOUNTER — Other Ambulatory Visit: Payer: Self-pay | Admitting: Cardiothoracic Surgery

## 2020-04-08 MED ORDER — OXYCODONE HCL 5 MG PO TABS: 5.0000 mg | ORAL_TABLET | Freq: Four times a day (QID) | ORAL | 0 refills | Status: DC | PRN

## 2020-04-10 ENCOUNTER — Encounter: Payer: Commercial Managed Care - PPO | Admitting: *Deleted

## 2020-04-10 ENCOUNTER — Other Ambulatory Visit: Payer: Self-pay

## 2020-04-10 VITALS — Ht 65.8 in | Wt 193.6 lb

## 2020-04-10 DIAGNOSIS — Z952 Presence of prosthetic heart valve: Secondary | ICD-10-CM | POA: Diagnosis not present

## 2020-04-10 DIAGNOSIS — Z951 Presence of aortocoronary bypass graft: Secondary | ICD-10-CM

## 2020-04-10 NOTE — Progress Notes (Signed)
Cardiac Individual Treatment Plan  Patient Details  Name: Curtis Maxwell MRN: [id number] Date of Birth: [birth year] Referring Provider:     Cardiac Rehab from 04/10/2020 in [facility] Cardiac and Pulmonary Rehab  Referring Provider Isaias Cowman MD      Initial Encounter Date:    Cardiac Rehab from 04/10/2020 in [facility] Cardiac and Pulmonary Rehab  Date 04/10/20      Visit Diagnosis: S/P CABG x 3  S/P AVR (aortic valve replacement)  Patient's Home Medications on Admission:  Current Outpatient Medications:  .  acetaminophen (TYLENOL) 325 MG tablet, Take 2 tablets (650 mg total) by mouth every 6 (six) hours as needed for mild pain or fever., Disp: , Rfl:  .  aspirin EC 325 MG EC tablet, Take 1 tablet (325 mg total) by mouth daily., Disp: 30 tablet, Rfl: 0 .  atorvastatin (LIPITOR) 10 MG tablet, Take 1 tablet (10 mg total) by mouth daily., Disp: 30 tablet, Rfl: 1 .  carvedilol (COREG) 3.125 MG tablet, Take 1 tablet (3.125 mg total) by mouth 2 (two) times daily with a meal., Disp: 60 tablet, Rfl: 1 .  Cholecalciferol 50 MCG (2000 UT) CAPS, Take 2,000 Units by mouth daily. , Disp: , Rfl:  .  colchicine 0.6 MG tablet, Take 1 tablet (0.6 mg total) by mouth 2 (two) times daily., Disp: 60 tablet, Rfl: 0 .  ferrous sulfate 325 (65 FE) MG tablet, Take 1 tablet (325 mg total) by mouth daily with breakfast. For one month then stop. If develops constipation, may stop sooner, Disp: 30 tablet, Rfl: 3 .  furosemide (LASIX) 40 MG tablet, Take 1 tablet (40 mg total) by mouth daily for 14 days., Disp: 14 tablet, Rfl: 0 .  lisinopril (ZESTRIL) 20 MG tablet, Take 1 tablet (20 mg total) by mouth daily. Take 2 tablets (40 mg) daily, Disp: 90 tablet, Rfl: 3 .  metFORMIN (GLUCOPHAGE) 500 MG tablet, Take 500 mg by mouth daily. , Disp: , Rfl:  .  Omega-3 Fatty Acids (FISH OIL) 1200 MG CAPS, Take 1,200 mg by mouth 2 (two) times a week. , Disp: , Rfl:  .  omeprazole (PRILOSEC) 20 MG capsule, Take 20-40 mg by mouth  daily as needed. , Disp: , Rfl:  .  oxyCODONE (OXY IR/ROXICODONE) 5 MG immediate release tablet, Take 1 tablet (5 mg total) by mouth every 6 (six) hours as needed for severe pain., Disp: 10 tablet, Rfl: 0 .  sildenafil (REVATIO) 20 MG tablet, Take 60-80 mg by mouth daily as needed (erectile dysfuntion.). , Disp: , Rfl:   Past Medical History: Past Medical History:  Diagnosis Date  . Coronary artery disease   . High cholesterol   . Hypertension   . Myocardial infarct, old   . Plantar fasciitis     Tobacco Use: Social History   Tobacco Use  Smoking Status Former Smoker  . Packs/day: 1.00  . Years: 15.00  . Pack years: 15.00  . Types: E-cigarettes, Cigarettes  . Quit date: 09/06/1998  . Years since quitting: 21.6  Smokeless Tobacco Never Used  Tobacco Comment   stopped smoking vapes 2 months ago    Labs: Recent Review Flowsheet Data    Labs for ITP Cardiac and Pulmonary Rehab Latest Ref Rng & Units 03/06/2020 03/07/2020 03/08/2020 03/09/2020 03/10/2020   Hemoglobin A1c 4.8 - 5.6 % - - - - -   PHART 7.35 - 7.45 - - - - -   PCO2ART 32 - 48 mmHg - - - - -  HCO3 20.0 - 28.0 mmol/L - - - - -   TCO2 22 - 32 mmol/L - - - - -   ACIDBASEDEF 0.0 - 2.0 mmol/L - - - - -   O2SAT % 58.3 58.8 65.6 49.9 54.7       Exercise Target Goals: Exercise Program Goal: Individual exercise prescription set using results from initial 6 min walk test and THRR while considering  patient's activity barriers and safety.   Exercise Prescription Goal: Initial exercise prescription builds to 30-45 minutes a day of aerobic activity, 2-3 days per week.  Home exercise guidelines will be given to patient during program as part of exercise prescription that the participant will acknowledge.   Education: Aerobic Exercise & Resistance Training: - Gives group verbal and written instruction on the various components of exercise. Focuses on aerobic and resistive training programs and the benefits of this training and how  to safely progress through these programs..   Education: Exercise & Equipment Safety: - Individual verbal instruction and demonstration of equipment use and safety with use of the equipment.   Cardiac Rehab from 04/10/2020 in [facility] Cardiac and Pulmonary Rehab  Date 04/06/20  Educator [facility]  Instruction Review Code 1- Verbalizes Understanding      Education: Exercise Physiology & General Exercise Guidelines: - Group verbal and written instruction with models to review the exercise physiology of the cardiovascular system and associated critical values. Provides general exercise guidelines with specific guidelines to those with heart or lung disease.    Cardiac Rehab from 04/10/2020 in [facility] Cardiac and Pulmonary Rehab  Date 04/10/20  Instruction Review Code 3- Needs Reinforcement  [need identified]      Education: Flexibility, Balance, Mind/Body Relaxation: Provides group verbal/written instruction on the benefits of flexibility and balance training, including mind/body exercise modes such as yoga, pilates and tai chi.  Demonstration and skill practice provided.   Activity Barriers & Risk Stratification:  Activity Barriers & Cardiac Risk Stratification - 04/10/20 1147      Activity Barriers & Cardiac Risk Stratification   Activity Barriers Incisional Pain;Shortness of Breath;Deconditioning;Joint Problems   shoulders sore from surgery   Cardiac Risk Stratification High           6 Minute Walk:  6 Minute Walk    Row Name 04/10/20 1146         6 Minute Walk   Phase Initial     Distance 1059 feet     Walk Time 6 minutes     # of Rest Breaks 0     MPH 2     METS 3.3     RPE 13     Perceived Dyspnea  3     VO2 Peak 11.56     Symptoms Yes (comment)     Comments chest burning 8/10, hard to breathe/SOB     Resting HR 73 bpm     Resting BP 122/66     Resting Oxygen Saturation  99 %     Exercise Oxygen Saturation  during 6 min walk 99 %     Max Ex. HR 107 bpm     Max Ex. BP 174/84      2 Minute Post BP 142/70            Oxygen Initial Assessment:   Oxygen Re-Evaluation:   Oxygen Discharge (Final Oxygen Re-Evaluation):   Initial Exercise Prescription:  Initial Exercise Prescription - 04/10/20 1100      Date of Initial Exercise RX and Referring  Provider   Date 04/10/20    Referring Provider Isaias Cowman MD      Treadmill   MPH 2    Grade 1    Minutes 15    METs 2.81      Recumbant Elliptical   Level 1    RPM 50    Minutes 15    METs 2.8      REL-XR   Level 2    Speed 50    Minutes 15    METs 3      Prescription Details   Frequency (times per week) 2    Duration Progress to 30 minutes of continuous aerobic without signs/symptoms of physical distress      Intensity   THRR 40-80% of Max Heartrate 109-144    Ratings of Perceived Exertion 11-13    Perceived Dyspnea 0-4      Progression   Progression Continue to progress workloads to maintain intensity without signs/symptoms of physical distress.      Resistance Training   Training Prescription Yes    Weight 3 lb    Reps 10-15           Perform Capillary Blood Glucose checks as needed.  Exercise Prescription Changes:  Exercise Prescription Changes    Row Name 04/10/20 1100             Response to Exercise   Blood Pressure (Admit) 122/66       Blood Pressure (Exercise) 174/84       Blood Pressure (Exit) 142/70       Heart Rate (Admit) 73 bpm       Heart Rate (Exercise) 107 bpm       Heart Rate (Exit) 73 bpm       Oxygen Saturation (Admit) 99 %       Oxygen Saturation (Exercise) 99 %       Rating of Perceived Exertion (Exercise) 13       Perceived Dyspnea (Exercise) 3       Symptoms chest buring (8/10), SOB/hard to breathe       Comments walk test results              Exercise Comments:   Exercise Goals and Review:  Exercise Goals    Row Name 04/10/20 1149             Exercise Goals   Increase Physical Activity Yes       Intervention Develop an  individualized exercise prescription for aerobic and resistive training based on initial evaluation findings, risk stratification, comorbidities and participant's personal goals.;Provide advice, education, support and counseling about physical activity/exercise needs.       Expected Outcomes Short Term: Attend rehab on a regular basis to increase amount of physical activity.;Long Term: Add in home exercise to make exercise part of routine and to increase amount of physical activity.;Long Term: Exercising regularly at least 3-5 days a week.       Increase Strength and Stamina Yes       Intervention Provide advice, education, support and counseling about physical activity/exercise needs.;Develop an individualized exercise prescription for aerobic and resistive training based on initial evaluation findings, risk stratification, comorbidities and participant's personal goals.       Expected Outcomes Short Term: Increase workloads from initial exercise prescription for resistance, speed, and METs.;Short Term: Perform resistance training exercises routinely during rehab and add in resistance training at home;Long Term: Improve cardiorespiratory fitness, muscular endurance and strength as measured by increased  METs and functional capacity (6MWT)       Able to understand and use rate of perceived exertion (RPE) scale Yes       Intervention Provide education and explanation on how to use RPE scale       Expected Outcomes Short Term: Able to use RPE daily in rehab to express subjective intensity level;Long Term:  Able to use RPE to guide intensity level when exercising independently       Able to understand and use Dyspnea scale Yes       Intervention Provide education and explanation on how to use Dyspnea scale       Expected Outcomes Long Term: Able to use Dyspnea scale to guide intensity level when exercising independently;Short Term: Able to use Dyspnea scale daily in rehab to express subjective sense of  shortness of breath during exertion       Knowledge and understanding of Target Heart Rate Range (THRR) Yes       Intervention Provide education and explanation of THRR including how the numbers were predicted and where they are located for reference       Expected Outcomes Short Term: Able to state/look up THRR;Short Term: Able to use daily as guideline for intensity in rehab;Long Term: Able to use THRR to govern intensity when exercising independently       Able to check pulse independently Yes       Intervention Provide education and demonstration on how to check pulse in carotid and radial arteries.;Review the importance of being able to check your own pulse for safety during independent exercise       Expected Outcomes Short Term: Able to explain why pulse checking is important during independent exercise;Long Term: Able to check pulse independently and accurately       Understanding of Exercise Prescription Yes       Intervention Provide education, explanation, and written materials on patient's individual exercise prescription       Expected Outcomes Short Term: Able to explain program exercise prescription;Long Term: Able to explain home exercise prescription to exercise independently              Exercise Goals Re-Evaluation :   Discharge Exercise Prescription (Final Exercise Prescription Changes):  Exercise Prescription Changes - 04/10/20 1100      Response to Exercise   Blood Pressure (Admit) 122/66    Blood Pressure (Exercise) 174/84    Blood Pressure (Exit) 142/70    Heart Rate (Admit) 73 bpm    Heart Rate (Exercise) 107 bpm    Heart Rate (Exit) 73 bpm    Oxygen Saturation (Admit) 99 %    Oxygen Saturation (Exercise) 99 %    Rating of Perceived Exertion (Exercise) 13    Perceived Dyspnea (Exercise) 3    Symptoms chest buring (8/10), SOB/hard to breathe    Comments walk test results           Nutrition:  Target Goals: Understanding of nutrition guidelines, daily  intake of sodium '1500mg'$ , cholesterol '200mg'$ , calories 30% from fat and 7% or less from saturated fats, daily to have 5 or more servings of fruits and vegetables.  Education: Controlling Sodium/Reading Food Labels -Group verbal and written material supporting the discussion of sodium use in heart healthy nutrition. Review and explanation with models, verbal and written materials for utilization of the food label.   Education: General Nutrition Guidelines/Fats and Fiber: -Group instruction provided by verbal, written material, models and posters to present the general guidelines  for heart healthy nutrition. Gives an explanation and review of dietary fats and fiber.   Cardiac Rehab from 04/10/2020 in [facility] Cardiac and Pulmonary Rehab  Date 04/10/20  Instruction Review Code 3- Needs Reinforcement  [need identified]      Biometrics:  Pre Biometrics - 04/10/20 1150      Pre Biometrics   Height 5' 5.8" (1.671 m)    Weight 193 lb 9 oz (87.8 kg)    BMI (Calculated) 31.44    Single Leg Stand 30 seconds            Nutrition Therapy Plan and Nutrition Goals:   Nutrition Assessments:  Nutrition Assessments - 04/10/20 1151      MEDFICTS Scores   Pre Score 47           MEDIFICTS Score Key:          ?70 Need to make dietary changes          40-70 Heart Healthy Diet         ? 40 Therapeutic Level Cholesterol Diet  Nutrition Goals Re-Evaluation:   Nutrition Goals Discharge (Final Nutrition Goals Re-Evaluation):   Psychosocial: Target Goals: Acknowledge presence or absence of significant depression and/or stress, maximize coping skills, provide positive support system. Participant is able to verbalize types and ability to use techniques and skills needed for reducing stress and depression.   Education: Depression - Provides group verbal and written instruction on the correlation between heart/lung disease and depressed mood, treatment options, and the stigmas associated with  seeking treatment.   Education: Sleep Hygiene -Provides group verbal and written instruction about how sleep can affect your health.  Define sleep hygiene, discuss sleep cycles and impact of sleep habits. Review good sleep hygiene tips.     Education: Stress and Anxiety: - Provides group verbal and written instruction about the health risks of elevated stress and causes of high stress.  Discuss the correlation between heart/lung disease and anxiety and treatment options. Review healthy ways to manage with stress and anxiety.    Initial Review & Psychosocial Screening:  Initial Psych Review & Screening - 04/06/20 1042      Initial Review   Current issues with None Identified      Family Dynamics   Good Support System? Yes    Comments He can look to his wife and church family for support. He states that he does not need a shrink but exercsie.      Barriers   Psychosocial barriers to participate in program The patient should benefit from training in stress management and relaxation.;There are no identifiable barriers or psychosocial needs.      Screening Interventions   Interventions Encouraged to exercise;To provide support and resources with identified psychosocial needs;Provide feedback about the scores to participant    Expected Outcomes Short Term goal: Utilizing psychosocial counselor, staff and physician to assist with identification of specific Stressors or current issues interfering with healing process. Setting desired goal for each stressor or current issue identified.;Long Term Goal: Stressors or current issues are controlled or eliminated.;Short Term goal: Identification and review with participant of any Quality of Life or Depression concerns found by scoring the questionnaire.;Long Term goal: The participant improves quality of Life and PHQ9 Scores as seen by post scores and/or verbalization of changes           Quality of Life Scores:   Quality of Life - 04/10/20 1150       Quality of Life   Select  Quality of Life      Quality of Life Scores   Health/Function Pre 17.57 %    Socioeconomic Pre 23.5 %    Psych/Spiritual Pre 21.43 %    Family Pre 24 %    GLOBAL Pre 20.44 %          Scores of 19 and below usually indicate a poorer quality of life in these areas.  A difference of  2-3 points is a clinically meaningful difference.  A difference of 2-3 points in the total score of the Quality of Life Index has been associated with significant improvement in overall quality of life, self-image, physical symptoms, and general health in studies assessing change in quality of life.  PHQ-9: Recent Review Flowsheet Data    Depression screen [facility] 2/9 04/10/2020   Decreased Interest 0   Down, Depressed, Hopeless 1   PHQ - 2 Score 1   Altered sleeping 2   Tired, decreased energy 1   Change in appetite 0   Feeling bad or failure about yourself  0   Trouble concentrating 0   Moving slowly or fidgety/restless 1   Suicidal thoughts 0   PHQ-9 Score 5   Difficult doing work/chores Not difficult at all     Interpretation of Total Score  Total Score Depression Severity:  1-4 = Minimal depression, 5-9 = Mild depression, 10-14 = Moderate depression, 15-19 = Moderately severe depression, 20-27 = Severe depression   Psychosocial Evaluation and Intervention:  Psychosocial Evaluation - 04/06/20 1046      Psychosocial Evaluation & Interventions   Interventions Therapist referral;Encouraged to exercise with the program and follow exercise prescription;Relaxation education;Stress management education    Comments He can look to his wife and church family for support. He states that he does not need a shrink but exercsie.    Expected Outcomes Short: Exercise regularly to support mental health and notify staff of any changes. Long: maintain mental health and well being through teaching of rehab or prescribed medications independently.    Continue Psychosocial Services  Follow  up required by staff           Psychosocial Re-Evaluation:   Psychosocial Discharge (Final Psychosocial Re-Evaluation):   Vocational Rehabilitation: Provide vocational rehab assistance to qualifying candidates.   Vocational Rehab Evaluation & Intervention:   Education: Education Goals: Education classes will be provided on a variety of topics geared toward better understanding of heart health and risk factor modification. Participant will state understanding/return demonstration of topics presented as noted by education test scores.  Learning Barriers/Preferences:  Learning Barriers/Preferences - 04/06/20 1041      Learning Barriers/Preferences   Learning Barriers None    Learning Preferences None           General Cardiac Education Topics:  AED/CPR: - Group verbal and written instruction with the use of models to demonstrate the basic use of the AED with the basic ABC's of resuscitation.   Anatomy & Physiology of the Heart: - Group verbal and written instruction and models provide basic cardiac anatomy and physiology, with the coronary electrical and arterial systems. Review of Valvular disease and Heart Failure   Cardiac Rehab from 04/10/2020 in [facility] Cardiac and Pulmonary Rehab  Date 04/10/20  Instruction Review Code 3- Needs Reinforcement  [need identified]      Cardiac Procedures: - Group verbal and written instruction to review commonly prescribed medications for heart disease. Reviews the medication, class of the drug, and side effects. Includes the steps to properly  store meds and maintain the prescription regimen. (beta blockers and nitrates)   Cardiac Medications I: - Group verbal and written instruction to review commonly prescribed medications for heart disease. Reviews the medication, class of the drug, and side effects. Includes the steps to properly store meds and maintain the prescription regimen.   Cardiac Medications II: -Group verbal and written  instruction to review commonly prescribed medications for heart disease. Reviews the medication, class of the drug, and side effects. (all other drug classes)    Go Sex-Intimacy & Heart Disease, Get SMART - Goal Setting: - Group verbal and written instruction through game format to discuss heart disease and the return to sexual intimacy. Provides group verbal and written material to discuss and apply goal setting through the application of the S.M.A.R.T. Method.   Other Matters of the Heart: - Provides group verbal, written materials and models to describe Stable Angina and Peripheral Artery. Includes description of the disease process and treatment options available to the cardiac patient.   Infection Prevention: - Provides verbal and written material to individual with discussion of infection control including proper hand washing and proper equipment cleaning during exercise session.   Cardiac Rehab from 04/10/2020 in [facility] Cardiac and Pulmonary Rehab  Date 04/06/20  Educator [facility]  Instruction Review Code 1- Verbalizes Understanding      Falls Prevention: - Provides verbal and written material to individual with discussion of falls prevention and safety.   Cardiac Rehab from 04/10/2020 in [facility] Cardiac and Pulmonary Rehab  Date 04/06/20  Educator [facility]  Instruction Review Code 1- Verbalizes Understanding      Other: -Provides group and verbal instruction on various topics (see comments)   Knowledge Questionnaire Score:  Knowledge Questionnaire Score - 04/10/20 1151      Knowledge Questionnaire Score   Pre Score 21/26 Education Focus: Nutrition, Exercise, Angina, MI           Core Components/Risk Factors/Patient Goals at Admission:  Personal Goals and Risk Factors at Admission - 04/10/20 1152      Core Components/Risk Factors/Patient Goals on Admission    Weight Management Yes;Weight Loss;Obesity    Intervention Weight Management: Develop a combined nutrition and exercise  program designed to reach desired caloric intake, while maintaining appropriate intake of nutrient and fiber, sodium and fats, and appropriate energy expenditure required for the weight goal.;Weight Management: Provide education and appropriate resources to help participant work on and attain dietary goals.;Weight Management/Obesity: Establish reasonable short term and long term weight goals.;Obesity: Provide education and appropriate resources to help participant work on and attain dietary goals.    Admit Weight 193 lb 9 oz (87.8 kg)    Goal Weight: Short Term 188 lb (85.3 kg)    Goal Weight: Long Term 180 lb (81.6 kg)    Expected Outcomes Short Term: Continue to assess and modify interventions until short term weight is achieved;Long Term: Adherence to nutrition and physical activity/exercise program aimed toward attainment of established weight goal;Weight Loss: Understanding of general recommendations for a balanced deficit meal plan, which promotes 1-2 lb weight loss per week and includes a negative energy balance of [phone] kcal/d;Understanding recommendations for meals to include 15-35% energy as protein, 25-35% energy from fat, 35-60% energy from carbohydrates, less than '200mg'$  of dietary cholesterol, 20-35 gm of total fiber daily;Understanding of distribution of calorie intake throughout the day with the consumption of 4-5 meals/snacks    Diabetes Yes    Intervention Provide education about signs/symptoms and action to take for hypo/hyperglycemia.;Provide  education about proper nutrition, including hydration, and aerobic/resistive exercise prescription along with prescribed medications to achieve blood glucose in normal ranges: Fasting glucose 65-99 mg/dL    Expected Outcomes Short Term: Participant verbalizes understanding of the signs/symptoms and immediate care of hyper/hypoglycemia, proper foot care and importance of medication, aerobic/resistive exercise and nutrition plan for blood glucose  control.;Long Term: Attainment of HbA1C < 7%.    Hypertension Yes    Intervention Provide education on lifestyle modifcations including regular physical activity/exercise, weight management, moderate sodium restriction and increased consumption of fresh fruit, vegetables, and low fat dairy, alcohol moderation, and smoking cessation.;Monitor prescription use compliance.    Expected Outcomes Short Term: Continued assessment and intervention until BP is < 140/46m HG in hypertensive participants. < 130/853mHG in hypertensive participants with diabetes, heart failure or chronic kidney disease.;Long Term: Maintenance of blood pressure at goal levels.    Lipids Yes    Intervention Provide education and support for participant on nutrition & aerobic/resistive exercise along with prescribed medications to achieve LDL '70mg'$ , HDL >'40mg'$ .    Expected Outcomes Short Term: Participant states understanding of desired cholesterol values and is compliant with medications prescribed. Participant is following exercise prescription and nutrition guidelines.;Long Term: Cholesterol controlled with medications as prescribed, with individualized exercise RX and with personalized nutrition plan. Value goals: LDL < '70mg'$ , HDL > 40 mg.           Education:Diabetes - Individual verbal and written instruction to review signs/symptoms of diabetes, desired ranges of glucose level fasting, after meals and with exercise. Acknowledge that pre and post exercise glucose checks will be done for 3 sessions at entry of program.   [facility]  Date 04/06/20  [facility]- Verbalizes Understanding      Education: Know Your Numbers and Risk Factors: -Group verbal and written instruction about important numbers in your health.  Discussion of what are risk factors and how they play a role in the disease process.  Review of Cholesterol, Blood Pressure, Diabetes,  and BMI and the role they play in your overall health.   Core Components/Risk Factors/Patient Goals Review:    Core Components/Risk Factors/Patient Goals at Discharge (Final Review):    ITP Comments:  ITP Comments    Row Name 04/06/20 1047 04/10/20 1145         ITP Comments Virtual Visit completed. Patient informed on EP and RD appointment and 6 Minute walk test. Patient also informed of patient health questionnaires on My Chart. Patient Verbalizes understanding. [facility] Completed 6MWT and gym orientation. Initial ITP created and sent for review to Dr. MaEmily FilbertMedical Director.             Comments: Initial ITP

## 2020-04-10 NOTE — Patient Instructions (Signed)
Patient Instructions  Patient Details  Name: Curtis Maxwell MRN: [id number] Date of Birth: 13-May-1961 Referring Provider:  Isaias Cowman, MD  Below are your personal goals for exercise, nutrition, and risk factors. Our goal is to help you stay on track towards obtaining and maintaining these goals. We will be discussing your progress on these goals with you throughout the program.  Initial Exercise Prescription:  Initial Exercise Prescription - 04/10/20 1100      Date of Initial Exercise RX and Referring Provider   Date 04/10/20    Referring Provider Paraschos, Alexander MD      Treadmill   MPH 2    Grade 1    Minutes 15    METs 2.81      Recumbant Elliptical   Level 1    RPM 50    Minutes 15    METs 2.8      REL-XR   Level 2    Speed 50    Minutes 15    METs 3      Prescription Details   Frequency (times per week) 2    Duration Progress to 30 minutes of continuous aerobic without signs/symptoms of physical distress      Intensity   THRR 40-80% of Max Heartrate 109-144    Ratings of Perceived Exertion 11-13    Perceived Dyspnea 0-4      Progression   Progression Continue to progress workloads to maintain intensity without signs/symptoms of physical distress.      Resistance Training   Training Prescription Yes    Weight 3 lb    Reps 10-15           Exercise Goals: Frequency: Be able to perform aerobic exercise two to three times per week in program working toward 2-5 days per week of home exercise.  Intensity: Work with a perceived exertion of 11 (fairly light) - 15 (hard) while following your exercise prescription.  We will make changes to your prescription with you as you progress through the program.   Duration: Be able to do 30 to 45 minutes of continuous aerobic exercise in addition to a 5 minute warm-up and a 5 minute cool-down routine.   Nutrition Goals: Your personal nutrition goals will be established when you do your nutrition analysis  with the dietician.  The following are general nutrition guidelines to follow: Cholesterol < 200mg /day Sodium < 1500mg /day Fiber: Men over 50 yrs - 30 grams per day  Personal Goals:  Personal Goals and Risk Factors at Admission - 04/10/20 1152      Core Components/Risk Factors/Patient Goals on Admission    Weight Management Yes;Weight Loss;Obesity    Intervention Weight Management: Develop a combined nutrition and exercise program designed to reach desired caloric intake, while maintaining appropriate intake of nutrient and fiber, sodium and fats, and appropriate energy expenditure required for the weight goal.;Weight Management: Provide education and appropriate resources to help participant work on and attain dietary goals.;Weight Management/Obesity: Establish reasonable short term and long term weight goals.;Obesity: Provide education and appropriate resources to help participant work on and attain dietary goals.    Admit Weight 193 lb 9 oz (87.8 kg)    Goal Weight: Short Term 188 lb (85.3 kg)    Goal Weight: Long Term 180 lb (81.6 kg)    Expected Outcomes Short Term: Continue to assess and modify interventions until short term weight is achieved;Long Term: Adherence to nutrition and physical activity/exercise program aimed toward attainment of established weight goal;Weight Loss:  Understanding of general recommendations for a balanced deficit meal plan, which promotes 1-2 lb weight loss per week and includes a negative energy balance of [phone] kcal/d;Understanding recommendations for meals to include 15-35% energy as protein, 25-35% energy from fat, 35-60% energy from carbohydrates, less than 200mg  of dietary cholesterol, 20-35 gm of total fiber daily;Understanding of distribution of calorie intake throughout the day with the consumption of 4-5 meals/snacks    Diabetes Yes    Intervention Provide education about signs/symptoms and action to take for hypo/hyperglycemia.;Provide education about  proper nutrition, including hydration, and aerobic/resistive exercise prescription along with prescribed medications to achieve blood glucose in normal ranges: Fasting glucose 65-99 mg/dL    Expected Outcomes Short Term: Participant verbalizes understanding of the signs/symptoms and immediate care of hyper/hypoglycemia, proper foot care and importance of medication, aerobic/resistive exercise and nutrition plan for blood glucose control.;Long Term: Attainment of HbA1C < 7%.    Hypertension Yes    Intervention Provide education on lifestyle modifcations including regular physical activity/exercise, weight management, moderate sodium restriction and increased consumption of fresh fruit, vegetables, and low fat dairy, alcohol moderation, and smoking cessation.;Monitor prescription use compliance.    Expected Outcomes Short Term: Continued assessment and intervention until BP is < 140/69mm HG in hypertensive participants. < 130/50mm HG in hypertensive participants with diabetes, heart failure or chronic kidney disease.;Long Term: Maintenance of blood pressure at goal levels.    Lipids Yes    Intervention Provide education and support for participant on nutrition & aerobic/resistive exercise along with prescribed medications to achieve LDL 70mg , HDL >40mg .    Expected Outcomes Short Term: Participant states understanding of desired cholesterol values and is compliant with medications prescribed. Participant is following exercise prescription and nutrition guidelines.;Long Term: Cholesterol controlled with medications as prescribed, with individualized exercise RX and with personalized nutrition plan. Value goals: LDL < 70mg , HDL > 40 mg.           Tobacco Use Initial Evaluation: Social History   Tobacco Use  Smoking Status Former Smoker  . Packs/day: 1.00  . Years: 15.00  . Pack years: 15.00  . Types: E-cigarettes, Cigarettes  . Quit date: 09/06/1998  . Years since quitting: 21.6  Smokeless Tobacco  Never Used  Tobacco Comment   stopped smoking vapes 2 months ago    Exercise Goals and Review:  Exercise Goals    Row Name 04/10/20 1149             Exercise Goals   Increase Physical Activity Yes       Intervention Develop an individualized exercise prescription for aerobic and resistive training based on initial evaluation findings, risk stratification, comorbidities and participant's personal goals.;Provide advice, education, support and counseling about physical activity/exercise needs.       Expected Outcomes Short Term: Attend rehab on a regular basis to increase amount of physical activity.;Long Term: Add in home exercise to make exercise part of routine and to increase amount of physical activity.;Long Term: Exercising regularly at least 3-5 days a week.       Increase Strength and Stamina Yes       Intervention Provide advice, education, support and counseling about physical activity/exercise needs.;Develop an individualized exercise prescription for aerobic and resistive training based on initial evaluation findings, risk stratification, comorbidities and participant's personal goals.       Expected Outcomes Short Term: Increase workloads from initial exercise prescription for resistance, speed, and METs.;Short Term: Perform resistance training exercises routinely during rehab and add in resistance training  at home;Long Term: Improve cardiorespiratory fitness, muscular endurance and strength as measured by increased METs and functional capacity (6MWT)       Able to understand and use rate of perceived exertion (RPE) scale Yes       Intervention Provide education and explanation on how to use RPE scale       Expected Outcomes Short Term: Able to use RPE daily in rehab to express subjective intensity level;Long Term:  Able to use RPE to guide intensity level when exercising independently       Able to understand and use Dyspnea scale Yes       Intervention Provide education and  explanation on how to use Dyspnea scale       Expected Outcomes Long Term: Able to use Dyspnea scale to guide intensity level when exercising independently;Short Term: Able to use Dyspnea scale daily in rehab to express subjective sense of shortness of breath during exertion       Knowledge and understanding of Target Heart Rate Range (THRR) Yes       Intervention Provide education and explanation of THRR including how the numbers were predicted and where they are located for reference       Expected Outcomes Short Term: Able to state/look up THRR;Short Term: Able to use daily as guideline for intensity in rehab;Long Term: Able to use THRR to govern intensity when exercising independently       Able to check pulse independently Yes       Intervention Provide education and demonstration on how to check pulse in carotid and radial arteries.;Review the importance of being able to check your own pulse for safety during independent exercise       Expected Outcomes Short Term: Able to explain why pulse checking is important during independent exercise;Long Term: Able to check pulse independently and accurately       Understanding of Exercise Prescription Yes       Intervention Provide education, explanation, and written materials on patient's individual exercise prescription       Expected Outcomes Short Term: Able to explain program exercise prescription;Long Term: Able to explain home exercise prescription to exercise independently              Copy of goals given to participant.

## 2020-04-11 ENCOUNTER — Encounter: Payer: Commercial Managed Care - PPO | Admitting: *Deleted

## 2020-04-11 ENCOUNTER — Other Ambulatory Visit: Payer: Self-pay | Admitting: Cardiothoracic Surgery

## 2020-04-11 ENCOUNTER — Other Ambulatory Visit: Payer: Self-pay

## 2020-04-11 DIAGNOSIS — Z951 Presence of aortocoronary bypass graft: Secondary | ICD-10-CM | POA: Diagnosis not present

## 2020-04-11 DIAGNOSIS — Z952 Presence of prosthetic heart valve: Secondary | ICD-10-CM

## 2020-04-11 LAB — GLUCOSE, CAPILLARY
Glucose-Capillary: 137 mg/dL — ABNORMAL HIGH (ref 70–99)
Glucose-Capillary: 105 mg/dL — ABNORMAL HIGH (ref 70–99)

## 2020-04-11 NOTE — Progress Notes (Signed)
Daily Session Note  Patient Details  Name: JORDYN DOANE MRN: [id number] Date of Birth: 12-01-1960 Referring Provider:     Cardiac Rehab from 04/10/2020 in [facility]  Referring Provider Isaias Cowman MD      Encounter Date: 04/11/2020  Check In:  Session Check In - 04/11/20 1038      Check-In   Supervising physician immediately available to respond to emergencies See telemetry face sheet for immediately available ER MD    Location [facility]-Cardiac & Pulmonary Rehab    Staff Present Coralie Keens, MS Exercise Physiologist;Amanda Oletta Darter, BA, ACSM CEP, Exercise Physiologist;Eria Lozoya, RN, BSN, CCRP    Virtual Visit No    Medication changes reported     No    Fall or balance concerns reported    No    Warm-up and Cool-down Performed on first and last piece of equipment    Resistance Training Performed Yes    VAD Patient? No    PAD/SET Patient? No      Pain Assessment   Currently in Pain? No/denies              Social History   Tobacco Use  Smoking Status Former Smoker  . Packs/day: 1.00  . Years: 15.00  . Pack years: 15.00  . Types: E-cigarettes, Cigarettes  . Quit date: 09/06/1998  . Years since quitting: 21.6  Smokeless Tobacco Never Used  Tobacco Comment   stopped smoking vapes 2 months ago    Goals Met:  Exercise tolerated well Personal goals reviewed Chest discomfort today  Goals Unmet:  Not Applicable  Comments: First full day of exercise!  Patient was oriented to gym and equipment including functions, settings, policies, and procedures.  Patient's individual exercise prescription and treatment plan were reviewed.  All starting workloads were established based on the results of the 6 minute walk test done at initial orientation visit.  The plan for exercise progression was also introduced and progression will be customized based on patient's performance and goals.  Today Kolt complained of chest discomfort/pain. Center of chest  and all down sternum area. He is 4 weeks post surgery. Incision looks healed. Mathan states when asked that his sternum is not moving. The discomfort/pain occurred when exercising. He stated that he has been walking at home and does not have this happen then. Advised him to do seated machine exercise and not use his arms.  Only ROM for resistive, no weights.That did help decrease the pain. He sees his surgeon tomorrow and will discuss this discomfort/pain during his visit.  Dr. Emily Filbert is Medical Director for [facility].

## 2020-04-12 ENCOUNTER — Encounter: Payer: Self-pay | Admitting: Cardiothoracic Surgery

## 2020-04-12 ENCOUNTER — Ambulatory Visit
Admission: RE | Admit: 2020-04-12 | Discharge: 2020-04-12 | Disposition: A | Discharge status: Home / Self Care | Payer: Commercial Managed Care - PPO | Source: Ambulatory Visit | Attending: Cardiothoracic Surgery | Admitting: Cardiothoracic Surgery

## 2020-04-12 ENCOUNTER — Other Ambulatory Visit: Payer: Self-pay

## 2020-04-12 ENCOUNTER — Encounter: Payer: Self-pay | Admitting: *Deleted

## 2020-04-12 ENCOUNTER — Ambulatory Visit (INDEPENDENT_AMBULATORY_CARE_PROVIDER_SITE_OTHER): Payer: Self-pay | Admitting: Cardiothoracic Surgery

## 2020-04-12 VITALS — BP 157/90 | HR 59 | Temp 97.5°F | Resp 18 | Ht 66.0 in | Wt 191.0 lb

## 2020-04-12 DIAGNOSIS — Z951 Presence of aortocoronary bypass graft: Secondary | ICD-10-CM

## 2020-04-12 DIAGNOSIS — I350 Nonrheumatic aortic (valve) stenosis: Secondary | ICD-10-CM

## 2020-04-12 DIAGNOSIS — Z952 Presence of prosthetic heart valve: Secondary | ICD-10-CM

## 2020-04-12 DIAGNOSIS — I2510 Atherosclerotic heart disease of native coronary artery without angina pectoris: Secondary | ICD-10-CM

## 2020-04-12 DIAGNOSIS — J90 Pleural effusion, not elsewhere classified: Secondary | ICD-10-CM

## 2020-04-12 DIAGNOSIS — Z9889 Other specified postprocedural states: Secondary | ICD-10-CM

## 2020-04-12 MED ORDER — AMLODIPINE BESYLATE 10 MG PO TABS
10.0000 mg | ORAL_TABLET | Freq: Every day | ORAL | 2 refills | Status: DC
Start: 2020-04-12 — End: 2022-08-09

## 2020-04-12 MED ORDER — FUROSEMIDE 40 MG PO TABS: 40.0000 mg | ORAL_TABLET | Freq: Every day | ORAL | 0 refills | Status: DC

## 2020-04-12 NOTE — Progress Notes (Signed)
PCP is [facility] Referring Provider is Toni Arthurs, NP  Chief Complaint  Patient presents with  . Routine Post Op    1 week f/u with CXR pleural effusion, HX of AVR/CABG    HPI: Patient turns with chest x-ray after thoracentesis for drainage of a large left postoperative pleural effusion.  The patient had AVR-CABG approximately 5 weeks ago.  His shortness of breath has significant improved after the thoracentesis.  1 L of serosanguineous fluid was removed.  Chest x-ray today is personally reviewed which shows significant improvement in aeration of the left lung.  Sternal wires remain intact.  No interstitial edema.  Past Medical History:  Diagnosis Date  . Coronary artery disease   . High cholesterol   . Hypertension   . Myocardial infarct, old   . Plantar fasciitis     Past Surgical History:  Procedure Laterality Date  . AORTIC VALVE REPLACEMENT N/A 03/03/2020   Procedure: AORTIC VALVE REPLACEMENT (AVR) Inspiris 55m;  Surgeon: VIvin Poot MD;  [facility]  Service: Open Heart Surgery;  Laterality: N/A;  . CARDIAC SURGERY    . CORONARY ANGIOPLASTY WITH STENT PLACEMENT    . CORONARY ARTERY BYPASS GRAFT N/A 03/03/2020   Procedure: CORONARY ARTERY BYPASS GRAFTING (CABG) x3. Endoscopic saphenous vein harvest  ;  Surgeon: VIvin Poot MD;  [facility]  Service: Open Heart Surgery;  Laterality: N/A;  . FLEXIBLE BRONCHOSCOPY N/A 03/03/2020   Procedure: FLEXIBLE BRONCHOSCOPY;  Surgeon: VIvin Poot MD;  [place] [facility]  Service: Open Heart Surgery;  Laterality: N/A;  . IR THORACENTESIS ASP PLEURAL SPACE W/IMG GUIDE  04/07/2020  . RIGHT/LEFT HEART CATH AND CORONARY ANGIOGRAPHY N/A 12/31/2017   Procedure: RIGHT/LEFT HEART CATH AND CORONARY ANGIOGRAPHY;  Surgeon: PIsaias Cowman MD;  [facility];  Service: Cardiovascular;  Laterality: N/A;  . RIGHT/LEFT HEART CATH AND CORONARY ANGIOGRAPHY N/A 02/15/2020   Procedure: RIGHT/LEFT HEART CATH  AND CORONARY ANGIOGRAPHY;  Surgeon: PIsaias Cowman MD;  [facility];  Service: Cardiovascular;  Laterality: N/A;  . TEE WITHOUT CARDIOVERSION N/A 03/03/2020   Procedure: TRANSESOPHAGEAL ECHOCARDIOGRAM (TEE);  Surgeon: VPrescott Gum PCollier Salina MD;  [place] [facility]  Service: Open Heart Surgery;  Laterality: N/A;    Family History  Problem Relation Age of Onset  . Gout Brother     Social History Social History   Tobacco Use  . Smoking status: Former Smoker    Packs/day: 1.00    Years: 15.00    Pack years: 15.00    Types: E-cigarettes, Cigarettes    Quit date: 09/06/1998    Years since quitting: 21.6  . Smokeless tobacco: Never Used  . Tobacco comment: stopped smoking vapes 2 months ago  Vaping Use  . Vaping Use: Former  Substance Use Topics  . Alcohol use: Yes    Alcohol/week: 10.0 standard drinks    Types: 10 Cans of beer per week  . Drug use: Never    Current Outpatient Medications  Medication Sig Dispense Refill  . acetaminophen (TYLENOL) 325 MG tablet Take 2 tablets (650 mg total) by mouth every 6 (six) hours as needed for mild pain or fever.    .Marland Kitchenaspirin EC 325 MG EC tablet Take 1 tablet (325 mg total) by mouth daily. 30 tablet 0  . atorvastatin (LIPITOR) 10 MG tablet Take 1 tablet (10 mg total) by mouth daily. 30 tablet 1  . carvedilol (COREG) 3.125 MG tablet Take 1 tablet (3.125 mg  total) by mouth 2 (two) times daily with a meal. 60 tablet 1  . Cholecalciferol 50 MCG (2000 UT) CAPS Take 2,000 Units by mouth daily.     . colchicine 0.6 MG tablet Take 1 tablet (0.6 mg total) by mouth 2 (two) times daily. 60 tablet 0  . ferrous sulfate 325 (65 FE) MG tablet Take 1 tablet (325 mg total) by mouth daily with breakfast. For one month then stop. If develops constipation, may stop sooner 30 tablet 3  . furosemide (LASIX) 40 MG tablet Take 1 tablet (40 mg total) by mouth daily for 14 days. 14 tablet 0  . lisinopril (ZESTRIL) 40 MG tablet Take 40 mg by mouth daily.     . Omega-3 Fatty Acids (FISH OIL) 1200 MG CAPS Take 1,200 mg by mouth 2 (two) times a week.     Marland Kitchen omeprazole (PRILOSEC) 20 MG capsule Take 20-40 mg by mouth daily as needed.     Marland Kitchen oxyCODONE (OXY IR/ROXICODONE) 5 MG immediate release tablet Take 1 tablet (5 mg total) by mouth every 6 (six) hours as needed for severe pain. 10 tablet 0  . sildenafil (REVATIO) 20 MG tablet Take 60-80 mg by mouth daily as needed (erectile dysfuntion.).     Marland Kitchen metFORMIN (GLUCOPHAGE) 500 MG tablet Take 500 mg by mouth daily.      No current facility-administered medications for this visit.    No Known Allergies  Review of Systems  Progressing in outpatient cardiac rehab Shortness of breath improved after thoracentesis We will resume preoperative dose of amlodipine for blood pressure control,  BP (!) 157/90 (BP Location: Left Arm, Patient Position: Sitting, Cuff Size: Normal)   Pulse (!) 59   Temp (!) 97.5 F (36.4 C)   Resp 18   Ht _0  (1.676 m)   Wt 191 lb (86.6 kg)   SpO2 98% Comment: RA  BMI 30.83 kg/m  Physical Exam      Exam    General- alert and comfortable    Neck- no JVD, no cervical adenopathy palpable, no carotid bruit   Lungs- clear without rales, wheezes   Cor- regular rate and rhythm, no murmur , gallop   Abdomen- soft, non-tender   Extremities - warm, non-tender, minimal edema   Neuro- oriented, appropriate, no focal weakness   Diagnostic Tests: Today's chest x-ray reviewed showing significant improvement/resolution of left pleural effusion after thoracentesis  Impression: Patient may drive and lift up to 10 pounds. Continue cardiac rehab Continue Lasix 40 mg daily until next visit Resume preoperative amlodipine dose for blood pressure control Avoid extra salt intake  Plan: Return with follow-up chest x-ray in 2-3 weeks.  Len Childs, MD [facility] [phone]

## 2020-04-12 NOTE — Progress Notes (Signed)
Cardiac Individual Treatment Plan  Patient Details  Name: Curtis Maxwell MRN: [id number] Date of Birth: [birth year] Referring Provider:     Cardiac Rehab from 04/10/2020 in [facility] Cardiac and Pulmonary Rehab  Referring Provider Isaias Cowman MD      Initial Encounter Date:    Cardiac Rehab from 04/10/2020 in [facility] Cardiac and Pulmonary Rehab  Date 04/10/20      Visit Diagnosis: S/P CABG x 3  S/P AVR (aortic valve replacement)  Patient's Home Medications on Admission:  Current Outpatient Medications:  .  acetaminophen (TYLENOL) 325 MG tablet, Take 2 tablets (650 mg total) by mouth every 6 (six) hours as needed for mild pain or fever., Disp: , Rfl:  .  aspirin EC 325 MG EC tablet, Take 1 tablet (325 mg total) by mouth daily., Disp: 30 tablet, Rfl: 0 .  atorvastatin (LIPITOR) 10 MG tablet, Take 1 tablet (10 mg total) by mouth daily., Disp: 30 tablet, Rfl: 1 .  carvedilol (COREG) 3.125 MG tablet, Take 1 tablet (3.125 mg total) by mouth 2 (two) times daily with a meal., Disp: 60 tablet, Rfl: 1 .  Cholecalciferol 50 MCG (2000 UT) CAPS, Take 2,000 Units by mouth daily. , Disp: , Rfl:  .  colchicine 0.6 MG tablet, Take 1 tablet (0.6 mg total) by mouth 2 (two) times daily., Disp: 60 tablet, Rfl: 0 .  ferrous sulfate 325 (65 FE) MG tablet, Take 1 tablet (325 mg total) by mouth daily with breakfast. For one month then stop. If develops constipation, may stop sooner, Disp: 30 tablet, Rfl: 3 .  furosemide (LASIX) 40 MG tablet, Take 1 tablet (40 mg total) by mouth daily for 14 days., Disp: 14 tablet, Rfl: 0 .  lisinopril (ZESTRIL) 20 MG tablet, Take 1 tablet (20 mg total) by mouth daily. Take 2 tablets (40 mg) daily, Disp: 90 tablet, Rfl: 3 .  metFORMIN (GLUCOPHAGE) 500 MG tablet, Take 500 mg by mouth daily. , Disp: , Rfl:  .  Omega-3 Fatty Acids (FISH OIL) 1200 MG CAPS, Take 1,200 mg by mouth 2 (two) times a week. , Disp: , Rfl:  .  omeprazole (PRILOSEC) 20 MG capsule, Take 20-40 mg by mouth  daily as needed. , Disp: , Rfl:  .  oxyCODONE (OXY IR/ROXICODONE) 5 MG immediate release tablet, Take 1 tablet (5 mg total) by mouth every 6 (six) hours as needed for severe pain., Disp: 10 tablet, Rfl: 0 .  sildenafil (REVATIO) 20 MG tablet, Take 60-80 mg by mouth daily as needed (erectile dysfuntion.). , Disp: , Rfl:   Past Medical History: Past Medical History:  Diagnosis Date  . Coronary artery disease   . High cholesterol   . Hypertension   . Myocardial infarct, old   . Plantar fasciitis     Tobacco Use: Social History   Tobacco Use  Smoking Status Former Smoker  . Packs/day: 1.00  . Years: 15.00  . Pack years: 15.00  . Types: E-cigarettes, Cigarettes  . Quit date: 09/06/1998  . Years since quitting: 21.6  Smokeless Tobacco Never Used  Tobacco Comment   stopped smoking vapes 2 months ago    Labs: Recent Review Flowsheet Data    Labs for ITP Cardiac and Pulmonary Rehab Latest Ref Rng & Units 03/06/2020 03/07/2020 03/08/2020 03/09/2020 03/10/2020   Hemoglobin A1c 4.8 - 5.6 % - - - - -   PHART 7.35 - 7.45 - - - - -   PCO2ART 32 - 48 mmHg - - - - -  HCO3 20.0 - 28.0 mmol/L - - - - -   TCO2 22 - 32 mmol/L - - - - -   ACIDBASEDEF 0.0 - 2.0 mmol/L - - - - -   O2SAT % 58.3 58.8 65.6 49.9 54.7       Exercise Target Goals: Exercise Program Goal: Individual exercise prescription set using results from initial 6 min walk test and THRR while considering  patient's activity barriers and safety.   Exercise Prescription Goal: Initial exercise prescription builds to 30-45 minutes a day of aerobic activity, 2-3 days per week.  Home exercise guidelines will be given to patient during program as part of exercise prescription that the participant will acknowledge.   Education: Aerobic Exercise & Resistance Training: - Gives group verbal and written instruction on the various components of exercise. Focuses on aerobic and resistive training programs and the benefits of this training and how  to safely progress through these programs..   Education: Exercise & Equipment Safety: - Individual verbal instruction and demonstration of equipment use and safety with use of the equipment.   Cardiac Rehab from 04/10/2020 in [facility] Cardiac and Pulmonary Rehab  Date 04/06/20  Educator [facility]  Instruction Review Code 1- Verbalizes Understanding      Education: Exercise Physiology & General Exercise Guidelines: - Group verbal and written instruction with models to review the exercise physiology of the cardiovascular system and associated critical values. Provides general exercise guidelines with specific guidelines to those with heart or lung disease.    Cardiac Rehab from 04/10/2020 in [facility] Cardiac and Pulmonary Rehab  Date 04/10/20  Instruction Review Code 3- Needs Reinforcement  [need identified]      Education: Flexibility, Balance, Mind/Body Relaxation: Provides group verbal/written instruction on the benefits of flexibility and balance training, including mind/body exercise modes such as yoga, pilates and tai chi.  Demonstration and skill practice provided.   Activity Barriers & Risk Stratification:  Activity Barriers & Cardiac Risk Stratification - 04/10/20 1147      Activity Barriers & Cardiac Risk Stratification   Activity Barriers Incisional Pain;Shortness of Breath;Deconditioning;Joint Problems   shoulders sore from surgery   Cardiac Risk Stratification High           6 Minute Walk:  6 Minute Walk    Row Name 04/10/20 1146         6 Minute Walk   Phase Initial     Distance 1059 feet     Walk Time 6 minutes     # of Rest Breaks 0     MPH 2     METS 3.3     RPE 13     Perceived Dyspnea  3     VO2 Peak 11.56     Symptoms Yes (comment)     Comments chest burning 8/10, hard to breathe/SOB     Resting HR 73 bpm     Resting BP 122/66     Resting Oxygen Saturation  99 %     Exercise Oxygen Saturation  during 6 min walk 99 %     Max Ex. HR 107 bpm     Max Ex. BP 174/84      2 Minute Post BP 142/70            Oxygen Initial Assessment:   Oxygen Re-Evaluation:   Oxygen Discharge (Final Oxygen Re-Evaluation):   Initial Exercise Prescription:  Initial Exercise Prescription - 04/10/20 1100      Date of Initial Exercise RX and Referring  Provider   Date 04/10/20    Referring Provider Isaias Cowman MD      Treadmill   MPH 2    Grade 1    Minutes 15    METs 2.81      Recumbant Elliptical   Level 1    RPM 50    Minutes 15    METs 2.8      REL-XR   Level 2    Speed 50    Minutes 15    METs 3      Prescription Details   Frequency (times per week) 2    Duration Progress to 30 minutes of continuous aerobic without signs/symptoms of physical distress      Intensity   THRR 40-80% of Max Heartrate 109-144    Ratings of Perceived Exertion 11-13    Perceived Dyspnea 0-4      Progression   Progression Continue to progress workloads to maintain intensity without signs/symptoms of physical distress.      Resistance Training   Training Prescription Yes    Weight 3 lb    Reps 10-15           Perform Capillary Blood Glucose checks as needed.  Exercise Prescription Changes:  Exercise Prescription Changes    Row Name 04/10/20 1100             Response to Exercise   Blood Pressure (Admit) 122/66       Blood Pressure (Exercise) 174/84       Blood Pressure (Exit) 142/70       Heart Rate (Admit) 73 bpm       Heart Rate (Exercise) 107 bpm       Heart Rate (Exit) 73 bpm       Oxygen Saturation (Admit) 99 %       Oxygen Saturation (Exercise) 99 %       Rating of Perceived Exertion (Exercise) 13       Perceived Dyspnea (Exercise) 3       Symptoms chest buring (8/10), SOB/hard to breathe       Comments walk test results              Exercise Comments:  Exercise Comments    Row Name 04/11/20 1039 04/11/20 1044         Exercise Comments First full day of exercise!  Patient was oriented to gym and equipment including  functions, settings, policies, and procedures.  Patient's individual exercise prescription and treatment plan were reviewed.  All starting workloads were established based on the results of the 6 minute walk test done at initial orientation visit.  The plan for exercise progression was also introduced and progression will be customized based on patient's performance and goals. Today Azreal complained of chest discomfort/pain. Center of chest and all down sternum area. He is 4 weeks post surgery. Incision looks healed. Nadir states when asked that his sternum is not moving. The discomfort/pain occurred when exercising. He stated that he has been walking at home and does not have this happen then. Advised him to do seated machine exercise and not use his arms.  Only ROM for resistive, no weights.That did help decrease the pain. He sees his surgeon tomorrow and will discuss this discomfort/pain during his visit.             Exercise Goals and Review:  Exercise Goals    Row Name 04/10/20 1149  Exercise Goals   Increase Physical Activity Yes       Intervention Develop an individualized exercise prescription for aerobic and resistive training based on initial evaluation findings, risk stratification, comorbidities and participant's personal goals.;Provide advice, education, support and counseling about physical activity/exercise needs.       Expected Outcomes Short Term: Attend rehab on a regular basis to increase amount of physical activity.;Long Term: Add in home exercise to make exercise part of routine and to increase amount of physical activity.;Long Term: Exercising regularly at least 3-5 days a week.       Increase Strength and Stamina Yes       Intervention Provide advice, education, support and counseling about physical activity/exercise needs.;Develop an individualized exercise prescription for aerobic and resistive training based on initial evaluation findings, risk  stratification, comorbidities and participant's personal goals.       Expected Outcomes Short Term: Increase workloads from initial exercise prescription for resistance, speed, and METs.;Short Term: Perform resistance training exercises routinely during rehab and add in resistance training at home;Long Term: Improve cardiorespiratory fitness, muscular endurance and strength as measured by increased METs and functional capacity (6MWT)       Able to understand and use rate of perceived exertion (RPE) scale Yes       Intervention Provide education and explanation on how to use RPE scale       Expected Outcomes Short Term: Able to use RPE daily in rehab to express subjective intensity level;Long Term:  Able to use RPE to guide intensity level when exercising independently       Able to understand and use Dyspnea scale Yes       Intervention Provide education and explanation on how to use Dyspnea scale       Expected Outcomes Long Term: Able to use Dyspnea scale to guide intensity level when exercising independently;Short Term: Able to use Dyspnea scale daily in rehab to express subjective sense of shortness of breath during exertion       Knowledge and understanding of Target Heart Rate Range (THRR) Yes       Intervention Provide education and explanation of THRR including how the numbers were predicted and where they are located for reference       Expected Outcomes Short Term: Able to state/look up THRR;Short Term: Able to use daily as guideline for intensity in rehab;Long Term: Able to use THRR to govern intensity when exercising independently       Able to check pulse independently Yes       Intervention Provide education and demonstration on how to check pulse in carotid and radial arteries.;Review the importance of being able to check your own pulse for safety during independent exercise       Expected Outcomes Short Term: Able to explain why pulse checking is important during independent  exercise;Long Term: Able to check pulse independently and accurately       Understanding of Exercise Prescription Yes       Intervention Provide education, explanation, and written materials on patient's individual exercise prescription       Expected Outcomes Short Term: Able to explain program exercise prescription;Long Term: Able to explain home exercise prescription to exercise independently              Exercise Goals Re-Evaluation :  Exercise Goals Re-Evaluation    [place] Name 04/11/20 1039             Exercise Goal Re-Evaluation   Exercise Goals Review  Understanding of Exercise Prescription;Knowledge and understanding of Target Heart Rate Range (THRR);Able to understand and use rate of perceived exertion (RPE) scale       Comments Reviewed RPE and dyspnea scales, THR and program prescription with pt today.  Pt voiced understanding and was given a copy of goals to take home.       Expected Outcomes Short: Use RPE daily to regulate intensity. Long: Follow program prescription in THR.              Discharge Exercise Prescription (Final Exercise Prescription Changes):  Exercise Prescription Changes - 04/10/20 1100      Response to Exercise   Blood Pressure (Admit) 122/66    Blood Pressure (Exercise) 174/84    Blood Pressure (Exit) 142/70    Heart Rate (Admit) 73 bpm    Heart Rate (Exercise) 107 bpm    Heart Rate (Exit) 73 bpm    Oxygen Saturation (Admit) 99 %    Oxygen Saturation (Exercise) 99 %    Rating of Perceived Exertion (Exercise) 13    Perceived Dyspnea (Exercise) 3    Symptoms chest buring (8/10), SOB/hard to breathe    Comments walk test results           Nutrition:  Target Goals: Understanding of nutrition guidelines, daily intake of sodium <1525m, cholesterol <201m calories 30% from fat and 7% or less from saturated fats, daily to have 5 or more servings of fruits and vegetables.  Education: Controlling Sodium/Reading Food Labels -Group verbal and  written material supporting the discussion of sodium use in heart healthy nutrition. Review and explanation with models, verbal and written materials for utilization of the food label.   Education: General Nutrition Guidelines/Fats and Fiber: -Group instruction provided by verbal, written material, models and posters to present the general guidelines for heart healthy nutrition. Gives an explanation and review of dietary fats and fiber.   [facility]  Date 04/10/20  Instruction Review Code 3- Needs Reinforcement  [need identified]      Biometrics:  Pre Biometrics - 04/10/20 1150      Pre Biometrics   Height 5' 5.8" (1.671 m)    Weight 193 lb 9 oz (87.8 kg)    BMI (Calculated) 31.44    Single Leg Stand 30 seconds            Nutrition Therapy Plan and Nutrition Goals:   Nutrition Assessments:  Nutrition Assessments - 04/10/20 1151      MEDFICTS Scores   Pre Score 47           MEDIFICTS Score Key:          ?70 Need to make dietary changes          40-70 Heart Healthy Diet         ? 40 Therapeutic Level Cholesterol Diet  Nutrition Goals Re-Evaluation:   Nutrition Goals Discharge (Final Nutrition Goals Re-Evaluation):   Psychosocial: Target Goals: Acknowledge presence or absence of significant depression and/or stress, maximize coping skills, provide positive support system. Participant is able to verbalize types and ability to use techniques and skills needed for reducing stress and depression.   Education: Depression - Provides group verbal and written instruction on the correlation between heart/lung disease and depressed mood, treatment options, and the stigmas associated with seeking treatment.   Education: Sleep Hygiene -Provides group verbal and written instruction about how sleep can affect your health.  Define sleep hygiene, discuss sleep  cycles and impact of sleep habits. Review good sleep hygiene tips.       Education: Stress and Anxiety: - Provides group verbal and written instruction about the health risks of elevated stress and causes of high stress.  Discuss the correlation between heart/lung disease and anxiety and treatment options. Review healthy ways to manage with stress and anxiety.    Initial Review & Psychosocial Screening:  Initial Psych Review & Screening - 04/06/20 1042      Initial Review   Current issues with None Identified      Family Dynamics   Good Support System? Yes    Comments He can look to his wife and church family for support. He states that he does not need a shrink but exercsie.      Barriers   Psychosocial barriers to participate in program The patient should benefit from training in stress management and relaxation.;There are no identifiable barriers or psychosocial needs.      Screening Interventions   Interventions Encouraged to exercise;To provide support and resources with identified psychosocial needs;Provide feedback about the scores to participant    Expected Outcomes Short Term goal: Utilizing psychosocial counselor, staff and physician to assist with identification of specific Stressors or current issues interfering with healing process. Setting desired goal for each stressor or current issue identified.;Long Term Goal: Stressors or current issues are controlled or eliminated.;Short Term goal: Identification and review with participant of any Quality of Life or Depression concerns found by scoring the questionnaire.;Long Term goal: The participant improves quality of Life and PHQ9 Scores as seen by post scores and/or verbalization of changes           Quality of Life Scores:   Quality of Life - 04/10/20 1150      Quality of Life   Select Quality of Life      Quality of Life Scores   Health/Function Pre 17.57 %    Socioeconomic Pre 23.5 %    Psych/Spiritual Pre 21.43 %    Family Pre 24 %    GLOBAL Pre 20.44 %          Scores of 19  and below usually indicate a poorer quality of life in these areas.  A difference of  2-3 points is a clinically meaningful difference.  A difference of 2-3 points in the total score of the Quality of Life Index has been associated with significant improvement in overall quality of life, self-image, physical symptoms, and general health in studies assessing change in quality of life.  PHQ-9: Recent Review Flowsheet Data    Depression screen [facility] 2/9 04/10/2020   Decreased Interest 0   Down, Depressed, Hopeless 1   PHQ - 2 Score 1   Altered sleeping 2   Tired, decreased energy 1   Change in appetite 0   Feeling bad or failure about yourself  0   Trouble concentrating 0   Moving slowly or fidgety/restless 1   Suicidal thoughts 0   PHQ-9 Score 5   Difficult doing work/chores Not difficult at all     Interpretation of Total Score  Total Score Depression Severity:  1-4 = Minimal depression, 5-9 = Mild depression, 10-14 = Moderate depression, 15-19 = Moderately severe depression, 20-27 = Severe depression   Psychosocial Evaluation and Intervention:  Psychosocial Evaluation - 04/06/20 1046      Psychosocial Evaluation & Interventions   Interventions Therapist referral;Encouraged to exercise with the program and follow exercise prescription;Relaxation education;Stress management education  Comments He can look to his wife and church family for support. He states that he does not need a shrink but exercsie.    Expected Outcomes Short: Exercise regularly to support mental health and notify staff of any changes. Long: maintain mental health and well being through teaching of rehab or prescribed medications independently.    Continue Psychosocial Services  Follow up required by staff           Psychosocial Re-Evaluation:   Psychosocial Discharge (Final Psychosocial Re-Evaluation):   Vocational Rehabilitation: Provide vocational rehab assistance to qualifying candidates.   Vocational  Rehab Evaluation & Intervention:   Education: Education Goals: Education classes will be provided on a variety of topics geared toward better understanding of heart health and risk factor modification. Participant will state understanding/return demonstration of topics presented as noted by education test scores.  Learning Barriers/Preferences:  Learning Barriers/Preferences - 04/06/20 1041      Learning Barriers/Preferences   Learning Barriers None    Learning Preferences None           General Cardiac Education Topics:  AED/CPR: - Group verbal and written instruction with the use of models to demonstrate the basic use of the AED with the basic ABC's of resuscitation.   Anatomy & Physiology of the Heart: - Group verbal and written instruction and models provide basic cardiac anatomy and physiology, with the coronary electrical and arterial systems. Review of Valvular disease and Heart Failure   Cardiac Rehab from 04/10/2020 in [facility] Cardiac and Pulmonary Rehab  Date 04/10/20  Instruction Review Code 3- Needs Reinforcement  [need identified]      Cardiac Procedures: - Group verbal and written instruction to review commonly prescribed medications for heart disease. Reviews the medication, class of the drug, and side effects. Includes the steps to properly store meds and maintain the prescription regimen. (beta blockers and nitrates)   Cardiac Medications I: - Group verbal and written instruction to review commonly prescribed medications for heart disease. Reviews the medication, class of the drug, and side effects. Includes the steps to properly store meds and maintain the prescription regimen.   Cardiac Medications II: -Group verbal and written instruction to review commonly prescribed medications for heart disease. Reviews the medication, class of the drug, and side effects. (all other drug classes)    Go Sex-Intimacy & Heart Disease, Get SMART - Goal Setting: - Group  verbal and written instruction through game format to discuss heart disease and the return to sexual intimacy. Provides group verbal and written material to discuss and apply goal setting through the application of the S.M.A.R.T. Method.   Other Matters of the Heart: - Provides group verbal, written materials and models to describe Stable Angina and Peripheral Artery. Includes description of the disease process and treatment options available to the cardiac patient.   Infection Prevention: - Provides verbal and written material to individual with discussion of infection control including proper hand washing and proper equipment cleaning during exercise session.   Cardiac Rehab from 04/10/2020 in [facility] Cardiac and Pulmonary Rehab  Date 04/06/20  Educator [facility]  Instruction Review Code 1- Verbalizes Understanding      Falls Prevention: - Provides verbal and written material to individual with discussion of falls prevention and safety.   Cardiac Rehab from 04/10/2020 in [facility] Cardiac and Pulmonary Rehab  Date 04/06/20  Educator [facility]  Instruction Review Code 1- Verbalizes Understanding      Other: -Provides group and verbal instruction on various topics (see comments)  Knowledge Questionnaire Score:  Knowledge Questionnaire Score - 04/10/20 1151      Knowledge Questionnaire Score   Pre Score 21/26 Education Focus: Nutrition, Exercise, Angina, MI           Core Components/Risk Factors/Patient Goals at Admission:  Personal Goals and Risk Factors at Admission - 04/10/20 1152      Core Components/Risk Factors/Patient Goals on Admission    Weight Management Yes;Weight Loss;Obesity    Intervention Weight Management: Develop a combined nutrition and exercise program designed to reach desired caloric intake, while maintaining appropriate intake of nutrient and fiber, sodium and fats, and appropriate energy expenditure required for the weight goal.;Weight Management: Provide education and  appropriate resources to help participant work on and attain dietary goals.;Weight Management/Obesity: Establish reasonable short term and long term weight goals.;Obesity: Provide education and appropriate resources to help participant work on and attain dietary goals.    Admit Weight 193 lb 9 oz (87.8 kg)    Goal Weight: Short Term 188 lb (85.3 kg)    Goal Weight: Long Term 180 lb (81.6 kg)    Expected Outcomes Short Term: Continue to assess and modify interventions until short term weight is achieved;Long Term: Adherence to nutrition and physical activity/exercise program aimed toward attainment of established weight goal;Weight Loss: Understanding of general recommendations for a balanced deficit meal plan, which promotes 1-2 lb weight loss per week and includes a negative energy balance of [phone] kcal/d;Understanding recommendations for meals to include 15-35% energy as protein, 25-35% energy from fat, 35-60% energy from carbohydrates, less than 216m of dietary cholesterol, 20-35 gm of total fiber daily;Understanding of distribution of calorie intake throughout the day with the consumption of 4-5 meals/snacks    Diabetes Yes    Intervention Provide education about signs/symptoms and action to take for hypo/hyperglycemia.;Provide education about proper nutrition, including hydration, and aerobic/resistive exercise prescription along with prescribed medications to achieve blood glucose in normal ranges: Fasting glucose 65-99 mg/dL    Expected Outcomes Short Term: Participant verbalizes understanding of the signs/symptoms and immediate care of hyper/hypoglycemia, proper foot care and importance of medication, aerobic/resistive exercise and nutrition plan for blood glucose control.;Long Term: Attainment of HbA1C < 7%.    Hypertension Yes    Intervention Provide education on lifestyle modifcations including regular physical activity/exercise, weight management, moderate sodium restriction and increased  consumption of fresh fruit, vegetables, and low fat dairy, alcohol moderation, and smoking cessation.;Monitor prescription use compliance.    Expected Outcomes Short Term: Continued assessment and intervention until BP is < 140/94mHG in hypertensive participants. < 130/8058mG in hypertensive participants with diabetes, heart failure or chronic kidney disease.;Long Term: Maintenance of blood pressure at goal levels.    Lipids Yes    Intervention Provide education and support for participant on nutrition & aerobic/resistive exercise along with prescribed medications to achieve LDL <13m33mDL >40mg61m Expected Outcomes Short Term: Participant states understanding of desired cholesterol values and is compliant with medications prescribed. Participant is following exercise prescription and nutrition guidelines.;Long Term: Cholesterol controlled with medications as prescribed, with individualized exercise RX and with personalized nutrition plan. Value goals: LDL < 13mg,72m > 40 mg.           Education:Diabetes - Individual verbal and written instruction to review signs/symptoms of diabetes, desired ranges of glucose level fasting, after meals and with exercise. Acknowledge that pre and post exercise glucose checks will be done for 3 sessions at entry of program.   Cardiac Rehab from 04/10/2020  in [facility] Cardiac and Pulmonary Rehab  Date 04/06/20  Educator [facility]  Instruction Review Code 1- Verbalizes Understanding      Education: Know Your Numbers and Risk Factors: -Group verbal and written instruction about important numbers in your health.  Discussion of what are risk factors and how they play a role in the disease process.  Review of Cholesterol, Blood Pressure, Diabetes, and BMI and the role they play in your overall health.   Core Components/Risk Factors/Patient Goals Review:    Core Components/Risk Factors/Patient Goals at Discharge (Final Review):    ITP Comments:  ITP Comments    Row  Name 04/06/20 1047 04/10/20 1145 04/11/20 1038 04/11/20 1043 04/12/20 0600   ITP Comments Virtual Visit completed. Patient informed on EP and RD appointment and 6 Minute walk test. Patient also informed of patient health questionnaires on My Chart. Patient Verbalizes understanding. Visit diagnosis can be found in [facility] 03/03/2020. Completed 6MWT and gym orientation. Initial ITP created and sent for review to Dr. Emily Filbert, Medical Director. First full day of exercise!  Patient was oriented to gym and equipment including functions, settings, policies, and procedures.  Patient's individual exercise prescription and treatment plan were reviewed.  All starting workloads were established based on the results of the 6 minute walk test done at initial orientation visit.  The plan for exercise progression was also introduced and progression will be customized based on patient's performance and goals. Today Keavon complained of chest discomfort/pain. Center of chest and all down sternum area. He is 4 weeks post surgery. Incision looks healed. Jaydan states when asked that his sternum is not moving. The discomfort/pain occurred when exercising. He stated that he has been walking at home and does not have this happen then. Advised him to do seated machine exercise and not use his arms.  Only ROM for resistive, no weights.That did help decrease the pain. He sees his surgeon tomorrow and will discuss this discomfort/pain during his visit. 30 Day review completed. Medical Director ITP review done, changes made as directed, and signed approval by Medical Director. New to program          Comments:

## 2020-04-13 ENCOUNTER — Other Ambulatory Visit (HOSPITAL_COMMUNITY): Payer: Self-pay | Admitting: Gerontology

## 2020-04-13 ENCOUNTER — Encounter: Payer: Commercial Managed Care - PPO | Admitting: *Deleted

## 2020-04-13 DIAGNOSIS — Z951 Presence of aortocoronary bypass graft: Secondary | ICD-10-CM

## 2020-04-13 DIAGNOSIS — Z952 Presence of prosthetic heart valve: Secondary | ICD-10-CM

## 2020-04-13 LAB — GLUCOSE, CAPILLARY
Glucose-Capillary: 87 mg/dL (ref 70–99)
Glucose-Capillary: 125 mg/dL — ABNORMAL HIGH (ref 70–99)
Glucose-Capillary: 96 mg/dL (ref 70–99)

## 2020-04-13 NOTE — Progress Notes (Signed)
Daily Session Note  Patient Details  Name: EMORY GALLENTINE MRN: [id number] Date of Birth: 1961-04-26 Referring Provider:     Cardiac Rehab from 04/10/2020 in [facility] Cardiac and Pulmonary Rehab  Referring Provider Isaias Cowman MD      Encounter Date: 04/13/2020  Check In:  Session Check In - 04/13/20 1002      Check-In   Supervising physician immediately available to respond to emergencies See telemetry face sheet for immediately available ER MD    Location [facility]-Cardiac & Pulmonary Rehab    Staff Present Renita Papa, RN BSN;Melissa Caiola RDN, Rowe Pavy, BA, ACSM CEP, Exercise Physiologist;Susanne Bice, RN, BSN, CCRP    Virtual Visit No    Medication changes reported     Yes    Comments increased lisinopril and amlodipine    Fall or balance concerns reported    No    Warm-up and Cool-down Performed on first and last piece of equipment    Resistance Training Performed Yes    VAD Patient? No    PAD/SET Patient? No      Pain Assessment   Currently in Pain? No/denies              Social History   Tobacco Use  Smoking Status Former Smoker  . Packs/day: 1.00  . Years: 15.00  . Pack years: 15.00  . Types: E-cigarettes, Cigarettes  . Quit date: 09/06/1998  . Years since quitting: 21.6  Smokeless Tobacco Never Used  Tobacco Comment   stopped smoking vapes 2 months ago    Goals Met:  Independence with exercise equipment Exercise tolerated well No report of cardiac concerns or symptoms Strength training completed today  Goals Unmet:  Not Applicable  Comments: Pt able to follow exercise prescription today without complaint.  Will continue to monitor for progression.    Dr. Emily Filbert is Medical Director for [place] and [facility].

## 2020-04-18 ENCOUNTER — Encounter: Payer: Commercial Managed Care - PPO | Admitting: *Deleted

## 2020-04-18 ENCOUNTER — Other Ambulatory Visit: Payer: Self-pay

## 2020-04-18 DIAGNOSIS — Z951 Presence of aortocoronary bypass graft: Secondary | ICD-10-CM | POA: Diagnosis not present

## 2020-04-18 DIAGNOSIS — Z952 Presence of prosthetic heart valve: Secondary | ICD-10-CM

## 2020-04-18 LAB — GLUCOSE, CAPILLARY
Glucose-Capillary: 89 mg/dL (ref 70–99)
Glucose-Capillary: 92 mg/dL (ref 70–99)

## 2020-04-18 NOTE — Progress Notes (Signed)
Daily Session Note  Patient Details  Name: Curtis Maxwell MRN: [id number] Date of Birth: [birth year] Referring Provider:     Cardiac Rehab from 04/10/2020 in [facility] Cardiac and Pulmonary Rehab  Referring Provider Isaias Cowman MD      Encounter Date: 04/18/2020  Check In:  Session Check In - 04/18/20 0934      Check-In   Supervising physician immediately available to respond to emergencies See telemetry face sheet for immediately available ER MD    Location [facility]-Cardiac & Pulmonary Rehab    Staff Present Heath Lark, RN, BSN, Jacklynn Bue, MS Exercise Physiologist;Amanda Oletta Darter, [place], ACSM CEP, Exercise Physiologist    Virtual Visit No    Medication changes reported     No    Fall or balance concerns reported    No    Warm-up and Cool-down Performed on first and last piece of equipment    Resistance Training Performed Yes    VAD Patient? No    PAD/SET Patient? No      Pain Assessment   Currently in Pain? No/denies              Social History   Tobacco Use  Smoking Status Former Smoker  . Packs/day: 1.00  . Years: 15.00  . Pack years: 15.00  . Types: E-cigarettes, Cigarettes  . Quit date: 09/06/1998  . Years since quitting: 21.6  Smokeless Tobacco Never Used  Tobacco Comment   stopped smoking vapes 2 months ago    Goals Met:  Independence with exercise equipment Exercise tolerated well No report of cardiac concerns or symptoms  Goals Unmet:  Not Applicable  Comments: Pt able to follow exercise prescription today without complaint.  Will continue to monitor for progression.    Dr. Emily Filbert is Medical Director for [place] and [facility].

## 2020-04-20 ENCOUNTER — Other Ambulatory Visit: Payer: Self-pay

## 2020-04-20 ENCOUNTER — Encounter: Payer: Commercial Managed Care - PPO | Admitting: *Deleted

## 2020-04-20 DIAGNOSIS — Z951 Presence of aortocoronary bypass graft: Secondary | ICD-10-CM | POA: Diagnosis not present

## 2020-04-20 DIAGNOSIS — Z952 Presence of prosthetic heart valve: Secondary | ICD-10-CM

## 2020-04-20 NOTE — Progress Notes (Signed)
Daily Session Note  Patient Details  Name: Curtis Maxwell MRN: [id number] Date of Birth: Mar 06, 1961 Referring Provider:     Cardiac Rehab from 04/10/2020 in [facility] Cardiac and Pulmonary Rehab  Referring Provider Isaias Cowman MD      Encounter Date: 04/20/2020  Check In:  Session Check In - 04/20/20 1025      Check-In   Supervising physician immediately available to respond to emergencies See telemetry face sheet for immediately available ER MD    Location [facility]-Cardiac & Pulmonary Rehab    Staff Present Heath Lark, RN, BSN, CCRP;Melissa Canal [place] RDN, LDN;Jessica Chaumont, MA, RCEP, CCRP, CCET;Amanda Sommer, BA, ACSM CEP, Exercise Physiologist    Virtual Visit No    Medication changes reported     No    Fall or balance concerns reported    No    Warm-up and Cool-down Performed on first and last piece of equipment    Resistance Training Performed Yes    VAD Patient? No    PAD/SET Patient? No      Pain Assessment   Currently in Pain? No/denies              Social History   Tobacco Use  Smoking Status Former Smoker  . Packs/day: 1.00  . Years: 15.00  . Pack years: 15.00  . Types: E-cigarettes, Cigarettes  . Quit date: 09/06/1998  . Years since quitting: 21.6  Smokeless Tobacco Never Used  Tobacco Comment   stopped smoking vapes 2 months ago    Goals Met:  Independence with exercise equipment Exercise tolerated well No report of cardiac concerns or symptoms  Goals Unmet:  Not Applicable  Comments: Pt able to follow exercise prescription today without complaint.  Will continue to monitor for progression.    Dr. Emily Filbert is Medical Director for [place] and [facility].

## 2020-04-25 ENCOUNTER — Other Ambulatory Visit: Payer: Self-pay

## 2020-04-25 ENCOUNTER — Encounter: Payer: Commercial Managed Care - PPO | Admitting: *Deleted

## 2020-04-25 ENCOUNTER — Other Ambulatory Visit: Payer: Self-pay | Admitting: Physician Assistant

## 2020-04-25 DIAGNOSIS — Z951 Presence of aortocoronary bypass graft: Secondary | ICD-10-CM | POA: Diagnosis not present

## 2020-04-25 DIAGNOSIS — Z952 Presence of prosthetic heart valve: Secondary | ICD-10-CM

## 2020-04-25 NOTE — Progress Notes (Signed)
Daily Session Note  Patient Details  Name: Curtis Maxwell MRN: [id number] Date of Birth: 03/27/1961 Referring Provider:     Cardiac Rehab from 04/10/2020 in [facility] Cardiac and Pulmonary Rehab  Referring Provider Paraschos, Alexander MD      Encounter Date: 04/25/2020  Check In:  Session Check In - 04/25/20 1015      Check-In   Supervising physician immediately available to respond to emergencies See telemetry face sheet for immediately available ER MD    Location [facility]-Cardiac & Pulmonary Rehab    Staff Present Susanne Bice, RN, BSN, CCRP;Kara Langdon, MS Exercise Physiologist;Amanda Sommer, BA, ACSM CEP, Exercise Physiologist    Virtual Visit No    Medication changes reported     No    Fall or balance concerns reported    No    Warm-up and Cool-down Performed on first and last piece of equipment    Resistance Training Performed Yes    VAD Patient? No    PAD/SET Patient? No      Pain Assessment   Currently in Pain? No/denies              Social History   Tobacco Use  Smoking Status Former Smoker  . Packs/day: 1.00  . Years: 15.00  . Pack years: 15.00  . Types: E-cigarettes, Cigarettes  . Quit date: 09/06/1998  . Years since quitting: 21.6  Smokeless Tobacco Never Used  Tobacco Comment   stopped smoking vapes 2 months ago    Goals Met:  Independence with exercise equipment Exercise tolerated well No report of cardiac concerns or symptoms  Goals Unmet:  Not Applicable  Comments: Pt able to follow exercise prescription today without complaint.  Will continue to monitor for progression.    Dr. Mark Miller is Medical Director for [facility] and [facility]. 

## 2020-04-27 ENCOUNTER — Other Ambulatory Visit: Payer: Self-pay

## 2020-04-27 ENCOUNTER — Encounter: Payer: Commercial Managed Care - PPO | Admitting: *Deleted

## 2020-04-27 DIAGNOSIS — Z951 Presence of aortocoronary bypass graft: Secondary | ICD-10-CM | POA: Diagnosis not present

## 2020-04-27 NOTE — Progress Notes (Signed)
Daily Session Note  Patient Details  Name: Curtis Maxwell MRN: [id number] Date of Birth: 1960-10-15 Referring Provider:     Cardiac Rehab from 04/10/2020 in [facility] Cardiac and Pulmonary Rehab  Referring Provider Isaias Cowman MD      Encounter Date: 04/27/2020  Check In:  Session Check In - 04/27/20 0915      Check-In   Location [facility]-Cardiac & Pulmonary Rehab    Staff Present Heath Lark, RN, BSN, CCRP;Melissa [place]wood RDN, Rowe Pavy, BA, ACSM CEP, Exercise Physiologist;Olive Zmuda Winston, MA, RCEP, CCRP, CCET    Virtual Visit No    Medication changes reported     No    Fall or balance concerns reported    No    Warm-up and Cool-down Performed on first and last piece of equipment    Resistance Training Performed Yes    VAD Patient? No    PAD/SET Patient? No      Pain Assessment   Currently in Pain? No/denies              Social History   Tobacco Use  Smoking Status Former Smoker  . Packs/day: 1.00  . Years: 15.00  . Pack years: 15.00  . Types: E-cigarettes, Cigarettes  . Quit date: 09/06/1998  . Years since quitting: 21.6  Smokeless Tobacco Never Used  Tobacco Comment   stopped smoking vapes 2 months ago    Goals Met:  Independence with exercise equipment Exercise tolerated well No report of cardiac concerns or symptoms Strength training completed today  Goals Unmet:  Not Applicable  Comments: Pt able to follow exercise prescription today without complaint.  Will continue to monitor for progression.    Dr. Emily Filbert is Medical Director for [place] and [facility].

## 2020-04-28 ENCOUNTER — Other Ambulatory Visit: Payer: Self-pay | Admitting: Cardiothoracic Surgery

## 2020-04-28 ENCOUNTER — Telehealth: Payer: Self-pay

## 2020-04-28 MED ORDER — OXYCODONE HCL 5 MG PO TABS: 5.0000 mg | ORAL_TABLET | Freq: Four times a day (QID) | ORAL | 0 refills | Status: DC | PRN

## 2020-04-28 NOTE — Telephone Encounter (Signed)
Pt called office earlier this afternoon to request a refill of Oxy IR d/t continued soreness in his chest after AVR/CABG by Dr. Prescott Gum on 03/03/20. He denies any acute symptoms. Dr. Prescott Gum made aware of pt's request.

## 2020-05-02 ENCOUNTER — Other Ambulatory Visit: Payer: Self-pay | Admitting: Cardiothoracic Surgery

## 2020-05-02 DIAGNOSIS — Z951 Presence of aortocoronary bypass graft: Secondary | ICD-10-CM

## 2020-05-03 ENCOUNTER — Other Ambulatory Visit: Payer: Self-pay

## 2020-05-03 ENCOUNTER — Ambulatory Visit
Admission: RE | Admit: 2020-05-03 | Discharge: 2020-05-03 | Disposition: A | Discharge status: Home / Self Care | Payer: Commercial Managed Care - PPO | Source: Ambulatory Visit | Attending: Cardiothoracic Surgery | Admitting: Cardiothoracic Surgery

## 2020-05-03 ENCOUNTER — Ambulatory Visit (INDEPENDENT_AMBULATORY_CARE_PROVIDER_SITE_OTHER): Payer: Self-pay | Admitting: Cardiothoracic Surgery

## 2020-05-03 ENCOUNTER — Encounter: Payer: Self-pay | Admitting: Cardiothoracic Surgery

## 2020-05-03 VITALS — BP 129/79 | HR 81 | Temp 97.5°F | Resp 16 | Ht 66.0 in | Wt 196.2 lb

## 2020-05-03 DIAGNOSIS — Z951 Presence of aortocoronary bypass graft: Secondary | ICD-10-CM

## 2020-05-03 DIAGNOSIS — J90 Pleural effusion, not elsewhere classified: Secondary | ICD-10-CM

## 2020-05-03 MED ORDER — FUROSEMIDE 40 MG PO TABS: 40.0000 mg | ORAL_TABLET | Freq: Every day | ORAL | 2 refills | Status: DC

## 2020-05-03 MED ORDER — TRAMADOL HCL 50 MG PO TABS: 50.0000 mg | ORAL_TABLET | Freq: Four times a day (QID) | ORAL | 0 refills | Status: DC | PRN

## 2020-05-03 NOTE — Progress Notes (Signed)
PCP is [place] Referring Provider is Toni Arthurs, NP  Chief Complaint  Patient presents with  . Routine Post Op    3 week f/u with CXR to assess L pl effusion    HPI: Patient returns with chest x-ray for follow-up after AVR-CABG 2 months ago.  The patient presented with moderate to severe AS, diastolic heart failure, and multivessel coronary disease.  Postop he developed a left pleural effusion which required a left thoracentesis approximately 1 month postop.  Today's chest x-ray is personally reviewed and shows complete resolution of the left pleural effusion.  Sternal wires are intact.  No interstitial pulmonary edema.  States he is walking 1 mile per day.  He understands he should not lift more than 10 pounds until 3 months after surgery. Patient still has some sternal soreness.  He understands that continuing oxycodone is not appropriate for musculoskeletal pain and we will give him one course of oral tramadol for musculoskeletal discomfort and soreness.  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  With his major cardiac surgery, history of diastolic heart failure, hypertension I agree with his plan to retire from his previous job..  Past Medical History:  Diagnosis Date  . Coronary artery disease   . High cholesterol   . Hypertension   . Myocardial infarct, old   . Plantar fasciitis     Past Surgical History:  Procedure Laterality Date  . AORTIC VALVE REPLACEMENT N/A 03/03/2020   Procedure: AORTIC VALVE REPLACEMENT (AVR) Inspiris 17m;  Surgeon: VIvin Poot MD;  [place] [facility]  Service: Open Heart Surgery;  Laterality: N/A;  . CARDIAC SURGERY    . CORONARY ANGIOPLASTY WITH STENT PLACEMENT    . CORONARY ARTERY BYPASS GRAFT N/A 03/03/2020   Procedure: CORONARY ARTERY BYPASS GRAFTING (CABG) x3. Endoscopic saphenous vein harvest  ;  Surgeon: VIvin Poot MD;  [place] [facility]   Service: Open Heart Surgery;  Laterality: N/A;  . FLEXIBLE BRONCHOSCOPY N/A 03/03/2020   Procedure: FLEXIBLE BRONCHOSCOPY;  Surgeon: VIvin Poot MD;  [place] [facility]  Service: Open Heart Surgery;  Laterality: N/A;  . IR THORACENTESIS ASP PLEURAL SPACE W/IMG GUIDE  04/07/2020  . RIGHT/LEFT HEART CATH AND CORONARY ANGIOGRAPHY N/A 12/31/2017   Procedure: RIGHT/LEFT HEART CATH AND CORONARY ANGIOGRAPHY;  Surgeon: PIsaias Cowman MD;  [facility];  Service: Cardiovascular;  Laterality: N/A;  . RIGHT/LEFT HEART CATH AND CORONARY ANGIOGRAPHY N/A 02/15/2020   Procedure: RIGHT/LEFT HEART CATH AND CORONARY ANGIOGRAPHY;  Surgeon: PIsaias Cowman MD;  [facility];  Service: Cardiovascular;  Laterality: N/A;  . TEE WITHOUT CARDIOVERSION N/A 03/03/2020   Procedure: TRANSESOPHAGEAL ECHOCARDIOGRAM (TEE);  Surgeon: VPrescott Gum PCollier Salina MD;  [place] [facility]  Service: Open Heart Surgery;  Laterality: N/A;    Family History  Problem Relation Age of Onset  . Gout Brother     Social History Social History   Tobacco Use  . Smoking status: Former Smoker    Packs/day: 1.00    Years: 15.00    Pack years: 15.00    Types: E-cigarettes, Cigarettes    Quit date: 09/06/1998    Years since quitting: 21.6  . Smokeless tobacco: Never Used  . Tobacco comment: stopped smoking vapes 2 months ago  Vaping Use  . Vaping Use: Former  Substance Use Topics  . Alcohol use: Yes    Alcohol/week: 10.0 standard drinks    Types: 10  Cans of beer per week  . Drug use: Never    Current Outpatient Medications  Medication Sig Dispense Refill  . acetaminophen (TYLENOL) 325 MG tablet Take 2 tablets (650 mg total) by mouth every 6 (six) hours as needed for mild pain or fever.    Marland Kitchen amLODipine (NORVASC) 10 MG tablet Take 1 tablet (10 mg total) by mouth daily. 30 tablet 2  . aspirin EC 325 MG EC tablet Take 1 tablet (325 mg total) by mouth daily. 30 tablet 0  . atorvastatin (LIPITOR) 10 MG  tablet Take 1 tablet (10 mg total) by mouth daily. 30 tablet 1  . carvedilol (COREG) 3.125 MG tablet Take 1 tablet (3.125 mg total) by mouth 2 (two) times daily with a meal. 60 tablet 1  . Cholecalciferol 50 MCG (2000 UT) CAPS Take 2,000 Units by mouth daily.     . colchicine 0.6 MG tablet Take 1 tablet (0.6 mg total) by mouth 2 (two) times daily. 60 tablet 0  . ferrous sulfate 325 (65 FE) MG tablet Take 1 tablet (325 mg total) by mouth daily with breakfast. For one month then stop. If develops constipation, may stop sooner 30 tablet 3  . lisinopril (ZESTRIL) 40 MG tablet Take 40 mg by mouth daily.    . Omega-3 Fatty Acids (FISH OIL) 1200 MG CAPS Take 1,200 mg by mouth 2 (two) times a week.     Marland Kitchen omeprazole (PRILOSEC) 20 MG capsule Take 20-40 mg by mouth daily as needed.     . sildenafil (REVATIO) 20 MG tablet Take 60-80 mg by mouth daily as needed (erectile dysfuntion.).     Marland Kitchen furosemide (LASIX) 40 MG tablet Take 1 tablet (40 mg total) by mouth daily for 14 days. 14 tablet 2  . metFORMIN (GLUCOPHAGE) 500 MG tablet Take 500 mg by mouth daily.     . traMADol (ULTRAM) 50 MG tablet Take 1 tablet (50 mg total) by mouth every 6 (six) hours as needed. 40 tablet 0   No current facility-administered medications for this visit.    No Known Allergies  Review of Systems  No fever No change in weight Right ankle edema remains mild No clicking or popping sensation of sternal incision  BP 129/79 (BP Location: Right Arm, Patient Position: Sitting, Cuff Size: Large)   Pulse 81   Temp (!) 97.5 F (36.4 C)   Resp 16   Ht _0  (1.676 m)   Wt 196 lb 3.2 oz (89 kg)   SpO2 97% Comment: RA  BMI 31.67 kg/m  Physical Exam      Exam    General- alert and comfortable.  Sternal and leg incision well-healed.    Neck- no JVD, no cervical adenopathy palpable, no carotid bruit   Lungs- clear without rales, wheezes   Cor- regular rate and rhythm, no murmur , gallop   Abdomen- soft, non-tender    Extremities - warm, non-tender, minimal edema   Neuro- oriented, appropriate, no focal weakness   Diagnostic Tests: Chest x-ray today personally reviewed showing complete resolution left pleural effusion  Impression: Recovering fairly well now 2 months after AVR-CABG. He may drive and do normal activities but no lifting more than 10 pounds. Plan: Return in 6 weeks for final postop evaluation and monitoring of progress.  I refilled his Lasix and give him a prescription of tramadol for his musculoskeletal discomfort.  He is encouraged to follow a heart healthy lifestyle including heart healthy diet and 30 minutes of walking 5  days a week  Len Childs, MD [facility] [phone]

## 2020-05-04 ENCOUNTER — Encounter: Payer: Commercial Managed Care - PPO | Attending: Cardiology | Admitting: *Deleted

## 2020-05-04 DIAGNOSIS — Z952 Presence of prosthetic heart valve: Secondary | ICD-10-CM | POA: Diagnosis not present

## 2020-05-04 DIAGNOSIS — Z951 Presence of aortocoronary bypass graft: Secondary | ICD-10-CM

## 2020-05-04 NOTE — Progress Notes (Signed)
Daily Session Note  Patient Details  Name: Curtis Maxwell MRN: [id number] Date of Birth: 1961-07-10 Referring Provider:     Cardiac Rehab from 04/10/2020 in [facility] Cardiac and Pulmonary Rehab  Referring Provider Isaias Cowman MD      Encounter Date: 05/04/2020  Check In:  Session Check In - 05/04/20 0914      Check-In   Supervising physician immediately available to respond to emergencies See telemetry face sheet for immediately available ER MD    Location [facility]-Cardiac & Pulmonary Rehab    Staff Present Renita Papa, RN BSN;Jessica Luan Pulling, MA, RCEP, CCRP, CCET;Melissa [place] RDN, Rowe Pavy, [place], ACSM CEP, Exercise Physiologist    Virtual Visit No    Medication changes reported     No    Fall or balance concerns reported    No    Warm-up and Cool-down Performed on first and last piece of equipment    Resistance Training Performed Yes    VAD Patient? No    PAD/SET Patient? No      Pain Assessment   Currently in Pain? No/denies              Social History   Tobacco Use  Smoking Status Former Smoker  . Packs/day: 1.00  . Years: 15.00  . Pack years: 15.00  . Types: E-cigarettes, Cigarettes  . Quit date: 09/06/1998  . Years since quitting: 21.6  Smokeless Tobacco Never Used  Tobacco Comment   stopped smoking vapes 2 months ago    Goals Met:  Independence with exercise equipment Exercise tolerated well No report of cardiac concerns or symptoms Strength training completed today  Goals Unmet:  Not Applicable  Comments: Pt able to follow exercise prescription today without complaint.  Will continue to monitor for progression.    Dr. Emily Filbert is Medical Director for [place] and [facility].

## 2020-05-09 ENCOUNTER — Other Ambulatory Visit: Payer: Self-pay

## 2020-05-09 ENCOUNTER — Encounter: Payer: Commercial Managed Care - PPO | Admitting: *Deleted

## 2020-05-09 DIAGNOSIS — Z952 Presence of prosthetic heart valve: Secondary | ICD-10-CM

## 2020-05-09 DIAGNOSIS — Z951 Presence of aortocoronary bypass graft: Secondary | ICD-10-CM

## 2020-05-09 NOTE — Progress Notes (Signed)
Daily Session Note  Patient Details  Name: Curtis Maxwell MRN: [id number] Date of Birth: 1961-08-04 Referring Provider:     Cardiac Rehab from 04/10/2020 in [facility] Cardiac and Pulmonary Rehab  Referring Provider Isaias Cowman MD      Encounter Date: 05/09/2020  Check In:  Session Check In - 05/09/20 0936      Check-In   Supervising physician immediately available to respond to emergencies See telemetry face sheet for immediately available ER MD    Location [facility]-Cardiac & Pulmonary Rehab    Staff Present Renita Papa, RN Geralyn Corwin, RN Margurite Auerbach, MS Exercise Physiologist;Amanda Oletta Darter, [place], ACSM CEP, Exercise Physiologist    Virtual Visit No    Medication changes reported     No    Fall or balance concerns reported    No    Warm-up and Cool-down Performed on first and last piece of equipment    Resistance Training Performed Yes    VAD Patient? No    PAD/SET Patient? No      Pain Assessment   Currently in Pain? No/denies              Social History   Tobacco Use  Smoking Status Former Smoker  . Packs/day: 1.00  . Years: 15.00  . Pack years: 15.00  . Types: E-cigarettes, Cigarettes  . Quit date: 09/06/1998  . Years since quitting: 21.6  Smokeless Tobacco Never Used  Tobacco Comment   stopped smoking vapes 2 months ago    Goals Met:  Independence with exercise equipment Exercise tolerated well No report of cardiac concerns or symptoms Strength training completed today  Goals Unmet:  Not Applicable  Comments: Pt able to follow exercise prescription today without complaint.  Will continue to monitor for progression.    Dr. Emily Filbert is Medical Director for [place] and [facility].

## 2020-05-10 ENCOUNTER — Encounter: Payer: Self-pay | Admitting: *Deleted

## 2020-05-10 DIAGNOSIS — Z951 Presence of aortocoronary bypass graft: Secondary | ICD-10-CM

## 2020-05-10 DIAGNOSIS — Z952 Presence of prosthetic heart valve: Secondary | ICD-10-CM

## 2020-05-10 NOTE — Progress Notes (Signed)
Cardiac Individual Treatment Plan  Patient Details  Name: Curtis Maxwell MRN: [id number] Date of Birth: 07-04-1961 Referring Provider:     Cardiac Rehab from 04/10/2020 in [facility] Cardiac and Pulmonary Rehab  Referring Provider Isaias Cowman MD      Initial Encounter Date:    Cardiac Rehab from 04/10/2020 in [facility] Cardiac and Pulmonary Rehab  Date 04/10/20      Visit Diagnosis: S/P CABG x 3  S/P AVR (aortic valve replacement)  Patient's Home Medications on Admission:  Current Outpatient Medications:  .  acetaminophen (TYLENOL) 325 MG tablet, Take 2 tablets (650 mg total) by mouth every 6 (six) hours as needed for mild pain or fever., Disp: , Rfl:  .  amLODipine (NORVASC) 10 MG tablet, Take 1 tablet (10 mg total) by mouth daily., Disp: 30 tablet, Rfl: 2 .  aspirin EC 325 MG EC tablet, Take 1 tablet (325 mg total) by mouth daily., Disp: 30 tablet, Rfl: 0 .  atorvastatin (LIPITOR) 10 MG tablet, Take 1 tablet (10 mg total) by mouth daily., Disp: 30 tablet, Rfl: 1 .  carvedilol (COREG) 3.125 MG tablet, Take 1 tablet (3.125 mg total) by mouth 2 (two) times daily with a meal., Disp: 60 tablet, Rfl: 1 .  Cholecalciferol 50 MCG (2000 UT) CAPS, Take 2,000 Units by mouth daily. , Disp: , Rfl:  .  colchicine 0.6 MG tablet, Take 1 tablet (0.6 mg total) by mouth 2 (two) times daily., Disp: 60 tablet, Rfl: 0 .  ferrous sulfate 325 (65 FE) MG tablet, Take 1 tablet (325 mg total) by mouth daily with breakfast. For one month then stop. If develops constipation, may stop sooner, Disp: 30 tablet, Rfl: 3 .  furosemide (LASIX) 40 MG tablet, Take 1 tablet (40 mg total) by mouth daily for 14 days., Disp: 14 tablet, Rfl: 2 .  lisinopril (ZESTRIL) 40 MG tablet, Take 40 mg by mouth daily., Disp: , Rfl:  .  metFORMIN (GLUCOPHAGE) 500 MG tablet, Take 500 mg by mouth daily. , Disp: , Rfl:  .  Omega-3 Fatty Acids (FISH OIL) 1200 MG CAPS, Take 1,200 mg by mouth 2 (two) times a week. , Disp: , Rfl:  .  omeprazole  (PRILOSEC) 20 MG capsule, Take 20-40 mg by mouth daily as needed. , Disp: , Rfl:  .  sildenafil (REVATIO) 20 MG tablet, Take 60-80 mg by mouth daily as needed (erectile dysfuntion.). , Disp: , Rfl:  .  traMADol (ULTRAM) 50 MG tablet, Take 1 tablet (50 mg total) by mouth every 6 (six) hours as needed., Disp: 40 tablet, Rfl: 0  Past Medical History: Past Medical History:  Diagnosis Date  . Coronary artery disease   . High cholesterol   . Hypertension   . Myocardial infarct, old   . Plantar fasciitis     Tobacco Use: Social History   Tobacco Use  Smoking Status Former Smoker  . Packs/day: 1.00  . Years: 15.00  . Pack years: 15.00  . Types: E-cigarettes, Cigarettes  . Quit date: 09/06/1998  . Years since quitting: 21.6  Smokeless Tobacco Never Used  Tobacco Comment   stopped smoking vapes 2 months ago    Labs: Recent Review Flowsheet Data    Labs for ITP Cardiac and Pulmonary Rehab Latest Ref Rng & Units 03/06/2020 03/07/2020 03/08/2020 03/09/2020 03/10/2020   Hemoglobin A1c 4.8 - 5.6 % - - - - -   PHART 7.35 - 7.45 - - - - -   PCO2ART 32 - 48 mmHg - - - - -  HCO3 20.0 - 28.0 mmol/L - - - - -   TCO2 22 - 32 mmol/L - - - - -   ACIDBASEDEF 0.0 - 2.0 mmol/L - - - - -   O2SAT % 58.3 58.8 65.6 49.9 54.7       Exercise Target Goals: Exercise Program Goal: Individual exercise prescription set using results from initial 6 min walk test and THRR while considering  patient's activity barriers and safety.   Exercise Prescription Goal: Initial exercise prescription builds to 30-45 minutes a day of aerobic activity, 2-3 days per week.  Home exercise guidelines will be given to patient during program as part of exercise prescription that the participant will acknowledge.   Education: Aerobic Exercise & Resistance Training: - Gives group verbal and written instruction on the various components of exercise. Focuses on aerobic and resistive training programs and the benefits of this training and  how to safely progress through these programs..   Cardiac Rehab from 05/04/2020 in [facility] Cardiac and Pulmonary Rehab  Date 04/20/20  Educator [facility]  Instruction Review Code 1- Verbalizes Understanding  [8/26 resistance]      Education: Exercise & Equipment Safety: - Individual verbal instruction and demonstration of equipment use and safety with use of the equipment.   Cardiac Rehab from 05/04/2020 in [facility] Cardiac and Pulmonary Rehab  Date 04/06/20  Educator [facility]  Instruction Review Code 1- Verbalizes Understanding      Education: Exercise Physiology & General Exercise Guidelines: - Group verbal and written instruction with models to review the exercise physiology of the cardiovascular system and associated critical values. Provides general exercise guidelines with specific guidelines to those with heart or lung disease.    Cardiac Rehab from 05/04/2020 in [facility] Cardiac and Pulmonary Rehab  Date 04/10/20  Instruction Review Code 3- Needs Reinforcement  [need identified]      Education: Flexibility, Balance, Mind/Body Relaxation: Provides group verbal/written instruction on the benefits of flexibility and balance training, including mind/body exercise modes such as yoga, pilates and tai chi.  Demonstration and skill practice provided.   Cardiac Rehab from 05/04/2020 in [facility] Cardiac and Pulmonary Rehab  Date 05/04/20  Educator [facility]  Instruction Review Code 1- Verbalizes Understanding      Activity Barriers & Risk Stratification:  Activity Barriers & Cardiac Risk Stratification - 04/10/20 1147      Activity Barriers & Cardiac Risk Stratification   Activity Barriers Incisional Pain;Shortness of Breath;Deconditioning;Joint Problems   shoulders sore from surgery   Cardiac Risk Stratification High           6 Minute Walk:  6 Minute Walk    Row Name 04/10/20 1146         6 Minute Walk   Phase Initial     Distance 1059 feet     Walk Time 6 minutes     # of Rest Breaks 0     MPH 2      METS 3.3     RPE 13     Perceived Dyspnea  3     VO2 Peak 11.56     Symptoms Yes (comment)     Comments chest burning 8/10, hard to breathe/SOB     Resting HR 73 bpm     Resting BP 122/66     Resting Oxygen Saturation  99 %     Exercise Oxygen Saturation  during 6 min walk 99 %     Max Ex. HR 107 bpm     Max Ex. BP  174/84     2 Minute Post BP 142/70            Oxygen Initial Assessment:   Oxygen Re-Evaluation:   Oxygen Discharge (Final Oxygen Re-Evaluation):   Initial Exercise Prescription:  Initial Exercise Prescription - 04/10/20 1100      Date of Initial Exercise RX and Referring Provider   Date 04/10/20    Referring Provider Paraschos, Alexander MD      Treadmill   MPH 2    Grade 1    Minutes 15    METs 2.81      Recumbant Elliptical   Level 1    RPM 50    Minutes 15    METs 2.8      REL-XR   Level 2    Speed 50    Minutes 15    METs 3      Prescription Details   Frequency (times per week) 2    Duration Progress to 30 minutes of continuous aerobic without signs/symptoms of physical distress      Intensity   THRR 40-80% of Max Heartrate 109-144    Ratings of Perceived Exertion 11-13    Perceived Dyspnea 0-4      Progression   Progression Continue to progress workloads to maintain intensity without signs/symptoms of physical distress.      Resistance Training   Training Prescription Yes    Weight 3 lb    Reps 10-15           Perform Capillary Blood Glucose checks as needed.  Exercise Prescription Changes:  Exercise Prescription Changes    Row Name 04/10/20 1100 04/12/20 1400 04/26/20 1400         Response to Exercise   Blood Pressure (Admit) 122/66 110/62 140/74     Blood Pressure (Exercise) 174/84 142/84 132/82     Blood Pressure (Exit) 142/70 128/64 136/70     Heart Rate (Admit) 73 bpm 81 bpm 82 bpm     Heart Rate (Exercise) 107 bpm 106 bpm 109 bpm     Heart Rate (Exit) 73 bpm 83 bpm 76 bpm     Oxygen Saturation (Admit) 99 %  -- --     Oxygen Saturation (Exercise) 99 % -- --     Rating of Perceived Exertion (Exercise) 13 15 13      Perceived Dyspnea (Exercise) 3 -- --     Symptoms chest buring (8/10), SOB/hard to breathe chest burning chest burning     Comments walk test results first full day of exercise --     Duration -- Progress to 30 minutes of  aerobic without signs/symptoms of physical distress Progress to 30 minutes of  aerobic without signs/symptoms of physical distress     Intensity -- THRR unchanged THRR unchanged       Progression   Progression -- Continue to progress workloads to maintain intensity without signs/symptoms of physical distress. Continue to progress workloads to maintain intensity without signs/symptoms of physical distress.     Average METs -- 2.81 2.15       Resistance Training   Training Prescription -- Yes Yes     Weight -- ROM ROM     Reps -- 10-15 10-15       Interval Training   Interval Training -- No No       Treadmill   MPH -- 2 2     Grade -- 1 1     Minutes -- 15 15  METs -- 2.81 2.81       Recumbant Elliptical   Level -- 1 1     RPM -- -- 50     Minutes -- 15 15     METs -- -- 1.5            Exercise Comments:  Exercise Comments    Row Name 04/11/20 1039 04/11/20 1044         Exercise Comments First full day of exercise!  Patient was oriented to gym and equipment including functions, settings, policies, and procedures.  Patient's individual exercise prescription and treatment plan were reviewed.  All starting workloads were established based on the results of the 6 minute walk test done at initial orientation visit.  The plan for exercise progression was also introduced and progression will be customized based on patient's performance and goals. Today Yosgart complained of chest discomfort/pain. Center of chest and all down sternum area. He is 4 weeks post surgery. Incision looks healed. Campbell states when asked that his sternum is not moving. The  discomfort/pain occurred when exercising. He stated that he has been walking at home and does not have this happen then. Advised him to do seated machine exercise and not use his arms.  Only ROM for resistive, no weights.That did help decrease the pain. He sees his surgeon tomorrow and will discuss this discomfort/pain during his visit.             Exercise Goals and Review:  Exercise Goals    Row Name 04/10/20 1149             Exercise Goals   Increase Physical Activity Yes       Intervention Develop an individualized exercise prescription for aerobic and resistive training based on initial evaluation findings, risk stratification, comorbidities and participant's personal goals.;Provide advice, education, support and counseling about physical activity/exercise needs.       Expected Outcomes Short Term: Attend rehab on a regular basis to increase amount of physical activity.;Long Term: Add in home exercise to make exercise part of routine and to increase amount of physical activity.;Long Term: Exercising regularly at least 3-5 days a week.       Increase Strength and Stamina Yes       Intervention Provide advice, education, support and counseling about physical activity/exercise needs.;Develop an individualized exercise prescription for aerobic and resistive training based on initial evaluation findings, risk stratification, comorbidities and participant's personal goals.       Expected Outcomes Short Term: Increase workloads from initial exercise prescription for resistance, speed, and METs.;Short Term: Perform resistance training exercises routinely during rehab and add in resistance training at home;Long Term: Improve cardiorespiratory fitness, muscular endurance and strength as measured by increased METs and functional capacity (6MWT)       Able to understand and use rate of perceived exertion (RPE) scale Yes       Intervention Provide education and explanation on how to use RPE scale        Expected Outcomes Short Term: Able to use RPE daily in rehab to express subjective intensity level;Long Term:  Able to use RPE to guide intensity level when exercising independently       Able to understand and use Dyspnea scale Yes       Intervention Provide education and explanation on how to use Dyspnea scale       Expected Outcomes Long Term: Able to use Dyspnea scale to guide intensity level when exercising independently;Short Term: Able  to use Dyspnea scale daily in rehab to express subjective sense of shortness of breath during exertion       Knowledge and understanding of Target Heart Rate Range (THRR) Yes       Intervention Provide education and explanation of THRR including how the numbers were predicted and where they are located for reference       Expected Outcomes Short Term: Able to state/look up THRR;Short Term: Able to use daily as guideline for intensity in rehab;Long Term: Able to use THRR to govern intensity when exercising independently       Able to check pulse independently Yes       Intervention Provide education and demonstration on how to check pulse in carotid and radial arteries.;Review the importance of being able to check your own pulse for safety during independent exercise       Expected Outcomes Short Term: Able to explain why pulse checking is important during independent exercise;Long Term: Able to check pulse independently and accurately       Understanding of Exercise Prescription Yes       Intervention Provide education, explanation, and written materials on patient's individual exercise prescription       Expected Outcomes Short Term: Able to explain program exercise prescription;Long Term: Able to explain home exercise prescription to exercise independently              Exercise Goals Re-Evaluation :  Exercise Goals Re-Evaluation    Row Name 04/11/20 1039 04/26/20 1418 04/26/20 1420         Exercise Goal Re-Evaluation   Exercise Goals Review  Understanding of Exercise Prescription;Knowledge and understanding of Target Heart Rate Range (THRR);Able to understand and use rate of perceived exertion (RPE) scale Understanding of Exercise Prescription;Knowledge and understanding of Target Heart Rate Range (THRR);Able to understand and use rate of perceived exertion (RPE) scale --     Comments Reviewed RPE and dyspnea scales, THR and program prescription with pt today.  Pt voiced understanding and was given a copy of goals to take home. Joe has had some chest burning with exercise. He ha sspoken with his Dr about this and they believe it is coming form his incision.     Expected Outcomes Short: Use RPE daily to regulate intensity. Long: Follow program prescription in THR. -- Short: attend consistently Long: exercise without symptoms            Discharge Exercise Prescription (Final Exercise Prescription Changes):  Exercise Prescription Changes - 04/26/20 1400      Response to Exercise   Blood Pressure (Admit) 140/74    Blood Pressure (Exercise) 132/82    Blood Pressure (Exit) 136/70    Heart Rate (Admit) 82 bpm    Heart Rate (Exercise) 109 bpm    Heart Rate (Exit) 76 bpm    Rating of Perceived Exertion (Exercise) 13    Symptoms chest burning    Duration Progress to 30 minutes of  aerobic without signs/symptoms of physical distress    Intensity THRR unchanged      Progression   Progression Continue to progress workloads to maintain intensity without signs/symptoms of physical distress.    Average METs 2.15      Resistance Training   Training Prescription Yes    Weight ROM    Reps 10-15      Interval Training   Interval Training No      Treadmill   MPH 2    Grade 1    Minutes 15  METs 2.81      Recumbant Elliptical   Level 1    RPM 50    Minutes 15    METs 1.5           Nutrition:  Target Goals: Understanding of nutrition guidelines, daily intake of sodium <1562m, cholesterol <2052m calories 30% from fat and  7% or less from saturated fats, daily to have 5 or more servings of fruits and vegetables.  Education: Controlling Sodium/Reading Food Labels -Group verbal and written material supporting the discussion of sodium use in heart healthy nutrition. Review and explanation with models, verbal and written materials for utilization of the food label.   Education: General Nutrition Guidelines/Fats and Fiber: -Group instruction provided by verbal, written material, models and posters to present the general guidelines for heart healthy nutrition. Gives an explanation and review of dietary fats and fiber.   [facility]  Date 04/10/20  Instruction Review Code 3- Needs Reinforcement  [need identified]      Biometrics:  Pre Biometrics - 04/10/20 1150      Pre Biometrics   Height 5' 5.8" (1.671 m)    Weight 193 lb 9 oz (87.8 kg)    BMI (Calculated) 31.44    Single Leg Stand 30 seconds            Nutrition Therapy Plan and Nutrition Goals:  Nutrition Therapy & Goals - 04/18/20 1018      Nutrition Therapy   Diet Heart healthy, low Na    Drug/Food Interactions Statins/Certain Fruits    Protein (specify units) 70g    Fiber 30 grams    Whole Grain Foods 3 servings    Saturated Fats 12 max. grams    Fruits and Vegetables 5 servings/day    Sodium 1.5 grams      Personal Nutrition Goals   Nutrition Goal LTBX:IDHWYSHithout breathing pain and chest pain.    Comments Pt primary cardiac rehab diagnosis is s/p CABG x3. Pt also presents with CAD, high cholesterol, HTN, GERD,  vitamin D deficiancy and low B12 -2020, T2DM (A1C 6.1). Alcohol 10/week - beer. Current relevant medications: lipitor, lasix (short term), metformin, iron, vitamin D. No longer deficient in vitamin D and B12.  B: pancakes with eggs and tu[place]age L: eggs on english muffin or polish tu[place]age and hotdog bun D: makes pizza yesterday. discussed heart healthy eating, pt would  not like to make any changes at this time.      Intervention Plan   Intervention Prescribe, educate and counsel regarding individualized specific dietary modifications aiming towards targeted core components such as weight, hypertension, lipid management, diabetes, heart failure and other comorbidities.;Nutrition handout(s) given to patient.    Expected Outcomes Short Term Goal: Understand basic principles of dietary content, such as calories, fat, sodium, cholesterol and nutrients.;Short Term Goal: A plan has been developed with personal nutrition goals set during dietitian appointment.;Long Term Goal: Adherence to prescribed nutrition plan.           Nutrition Assessments:  Nutrition Assessments - 04/10/20 1151      MEDFICTS Scores   Pre Score 47           MEDIFICTS Score Key:          ?70 Need to make dietary changes          40-70 Heart Healthy Diet         ? 40 Therapeutic Level Cholesterol Diet  Nutrition Goals  Re-Evaluation:   Nutrition Goals Discharge (Final Nutrition Goals Re-Evaluation):   Psychosocial: Target Goals: Acknowledge presence or absence of significant depression and/or stress, maximize coping skills, provide positive support system. Participant is able to verbalize types and ability to use techniques and skills needed for reducing stress and depression.   Education: Depression - Provides group verbal and written instruction on the correlation between heart/lung disease and depressed mood, treatment options, and the stigmas associated with seeking treatment.   Education: Sleep Hygiene -Provides group verbal and written instruction about how sleep can affect your health.  Define sleep hygiene, discuss sleep cycles and impact of sleep habits. Review good sleep hygiene tips.     Education: Stress and Anxiety: - Provides group verbal and written instruction about the health risks of elevated stress and causes of high stress.  Discuss the correlation  between heart/lung disease and anxiety and treatment options. Review healthy ways to manage with stress and anxiety.    Initial Review & Psychosocial Screening:  Initial Psych Review & Screening - 04/06/20 1042      Initial Review   Current issues with None Identified      Family Dynamics   Good Support System? Yes    Comments He can look to his wife and church family for support. He states that he does not need a shrink but exercsie.      Barriers   Psychosocial barriers to participate in program The patient should benefit from training in stress management and relaxation.;There are no identifiable barriers or psychosocial needs.      Screening Interventions   Interventions Encouraged to exercise;To provide support and resources with identified psychosocial needs;Provide feedback about the scores to participant    Expected Outcomes Short Term goal: Utilizing psychosocial counselor, staff and physician to assist with identification of specific Stressors or current issues interfering with healing process. Setting desired goal for each stressor or current issue identified.;Long Term Goal: Stressors or current issues are controlled or eliminated.;Short Term goal: Identification and review with participant of any Quality of Life or Depression concerns found by scoring the questionnaire.;Long Term goal: The participant improves quality of Life and PHQ9 Scores as seen by post scores and/or verbalization of changes           Quality of Life Scores:   Quality of Life - 04/10/20 1150      Quality of Life   Select Quality of Life      Quality of Life Scores   Health/Function Pre 17.57 %    Socioeconomic Pre 23.5 %    Psych/Spiritual Pre 21.43 %    Family Pre 24 %    GLOBAL Pre 20.44 %          Scores of 19 and below usually indicate a poorer quality of life in these areas.  A difference of  2-3 points is a clinically meaningful difference.  A difference of 2-3 points in the total score  of the Quality of Life Index has been associated with significant improvement in overall quality of life, self-image, physical symptoms, and general health in studies assessing change in quality of life.  PHQ-9: Recent Review Flowsheet Data    Depression screen [facility] 2/9 04/10/2020   Decreased Interest 0   Down, Depressed, Hopeless 1   PHQ - 2 Score 1   Altered sleeping 2   Tired, decreased energy 1   Change in appetite 0   Feeling bad or failure about yourself  0   Trouble concentrating  0   Moving slowly or fidgety/restless 1   Suicidal thoughts 0   PHQ-9 Score 5   Difficult doing work/chores Not difficult at all     Interpretation of Total Score  Total Score Depression Severity:  1-4 = Minimal depression, 5-9 = Mild depression, 10-14 = Moderate depression, 15-19 = Moderately severe depression, 20-27 = Severe depression   Psychosocial Evaluation and Intervention:  Psychosocial Evaluation - 04/06/20 1046      Psychosocial Evaluation & Interventions   Interventions Therapist referral;Encouraged to exercise with the program and follow exercise prescription;Relaxation education;Stress management education    Comments He can look to his wife and church family for support. He states that he does not need a shrink but exercsie.    Expected Outcomes Short: Exercise regularly to support mental health and notify staff of any changes. Long: maintain mental health and well being through teaching of rehab or prescribed medications independently.    Continue Psychosocial Services  Follow up required by staff           Psychosocial Re-Evaluation:   Psychosocial Discharge (Final Psychosocial Re-Evaluation):   Vocational Rehabilitation: Provide vocational rehab assistance to qualifying candidates.   Vocational Rehab Evaluation & Intervention:   Education: Education Goals: Education classes will be provided on a variety of topics geared toward better understanding of heart health and risk  factor modification. Participant will state understanding/return demonstration of topics presented as noted by education test scores.  Learning Barriers/Preferences:  Learning Barriers/Preferences - 04/06/20 1041      Learning Barriers/Preferences   Learning Barriers None    Learning Preferences None           General Cardiac Education Topics:  AED/CPR: - Group verbal and written instruction with the use of models to demonstrate the basic use of the AED with the basic ABC's of resuscitation.   Anatomy & Physiology of the Heart: - Group verbal and written instruction and models provide basic cardiac anatomy and physiology, with the coronary electrical and arterial systems. Review of Valvular disease and Heart Failure   Cardiac Rehab from 05/04/2020 in [facility] Cardiac and Pulmonary Rehab  Date 04/27/20  Educator SB  Instruction Review Code 1- Verbalizes Understanding  [need identified]      Cardiac Procedures: - Group verbal and written instruction to review commonly prescribed medications for heart disease. Reviews the medication, class of the drug, and side effects. Includes the steps to properly store meds and maintain the prescription regimen. (beta blockers and nitrates)   Cardiac Rehab from 05/04/2020 in [facility] Cardiac and Pulmonary Rehab  Date 04/27/20  Educator SB  Instruction Review Code 1- Verbalizes Understanding      Cardiac Medications I: - Group verbal and written instruction to review commonly prescribed medications for heart disease. Reviews the medication, class of the drug, and side effects. Includes the steps to properly store meds and maintain the prescription regimen.   Cardiac Medications II: -Group verbal and written instruction to review commonly prescribed medications for heart disease. Reviews the medication, class of the drug, and side effects. (all other drug classes)    Go Sex-Intimacy & Heart Disease, Get SMART - Goal Setting: - Group verbal and  written instruction through game format to discuss heart disease and the return to sexual intimacy. Provides group verbal and written material to discuss and apply goal setting through the application of the S.M.A.R.T. Method.   Cardiac Rehab from 05/04/2020 in [facility] Cardiac and Pulmonary Rehab  Date 04/27/20  Educator SB  Instruction Review Code 1- Verbalizes Understanding      Other Matters of the Heart: - Provides group verbal, written materials and models to describe Stable Angina and Peripheral Artery. Includes description of the disease process and treatment options available to the cardiac patient.   Infection Prevention: - Provides verbal and written material to individual with discussion of infection control including proper hand washing and proper equipment cleaning during exercise session.   Cardiac Rehab from 05/04/2020 in [facility] Cardiac and Pulmonary Rehab  Date 04/06/20  Educator [facility]  Instruction Review Code 1- Verbalizes Understanding      Falls Prevention: - Provides verbal and written material to individual with discussion of falls prevention and safety.   Cardiac Rehab from 05/04/2020 in [facility] Cardiac and Pulmonary Rehab  Date 04/06/20  Educator [facility]  Instruction Review Code 1- Verbalizes Understanding      Other: -Provides group and verbal instruction on various topics (see comments)   Knowledge Questionnaire Score:  Knowledge Questionnaire Score - 04/10/20 1151      Knowledge Questionnaire Score   Pre Score 21/26 Education Focus: Nutrition, Exercise, Angina, MI           Core Components/Risk Factors/Patient Goals at Admission:  Personal Goals and Risk Factors at Admission - 04/10/20 1152      Core Components/Risk Factors/Patient Goals on Admission    Weight Management Yes;Weight Loss;Obesity    Intervention Weight Management: Develop a combined nutrition and exercise program designed to reach desired caloric intake, while maintaining appropriate intake of  nutrient and fiber, sodium and fats, and appropriate energy expenditure required for the weight goal.;Weight Management: Provide education and appropriate resources to help participant work on and attain dietary goals.;Weight Management/Obesity: Establish reasonable short term and long term weight goals.;Obesity: Provide education and appropriate resources to help participant work on and attain dietary goals.    Admit Weight 193 lb 9 oz (87.8 kg)    Goal Weight: Short Term 188 lb (85.3 kg)    Goal Weight: Long Term 180 lb (81.6 kg)    Expected Outcomes Short Term: Continue to assess and modify interventions until short term weight is achieved;Long Term: Adherence to nutrition and physical activity/exercise program aimed toward attainment of established weight goal;Weight Loss: Understanding of general recommendations for a balanced deficit meal plan, which promotes 1-2 lb weight loss per week and includes a negative energy balance of [phone] kcal/d;Understanding recommendations for meals to include 15-35% energy as protein, 25-35% energy from fat, 35-60% energy from carbohydrates, less than 267m of dietary cholesterol, 20-35 gm of total fiber daily;Understanding of distribution of calorie intake throughout the day with the consumption of 4-5 meals/snacks    Diabetes Yes    Intervention Provide education about signs/symptoms and action to take for hypo/hyperglycemia.;Provide education about proper nutrition, including hydration, and aerobic/resistive exercise prescription along with prescribed medications to achieve blood glucose in normal ranges: Fasting glucose 65-99 mg/dL    Expected Outcomes Short Term: Participant verbalizes understanding of the signs/symptoms and immediate care of hyper/hypoglycemia, proper foot care and importance of medication, aerobic/resistive exercise and nutrition plan for blood glucose control.;Long Term: Attainment of HbA1C < 7%.    Hypertension Yes    Intervention Provide  education on lifestyle modifcations including regular physical activity/exercise, weight management, moderate sodium restriction and increased consumption of fresh fruit, vegetables, and low fat dairy, alcohol moderation, and smoking cessation.;Monitor prescription use compliance.    Expected Outcomes Short Term: Continued assessment and intervention until BP is < 140/950mHG in hypertensive  participants. < 130/40m HG in hypertensive participants with diabetes, heart failure or chronic kidney disease.;Long Term: Maintenance of blood pressure at goal levels.    Lipids Yes    Intervention Provide education and support for participant on nutrition & aerobic/resistive exercise along with prescribed medications to achieve LDL <749m HDL >4014m   Expected Outcomes Short Term: Participant states understanding of desired cholesterol values and is compliant with medications prescribed. Participant is following exercise prescription and nutrition guidelines.;Long Term: Cholesterol controlled with medications as prescribed, with individualized exercise RX and with personalized nutrition plan. Value goals: LDL < 14m18mDL > 40 mg.           Education:Diabetes - Individual verbal and written instruction to review signs/symptoms of diabetes, desired ranges of glucose level fasting, after meals and with exercise. Acknowledge that pre and post exercise glucose checks will be done for 3 sessions at entry of program.   [facility]  Date 04/06/20  Educator [facility]- Verbalizes Understanding      Education: Know Your Numbers and Risk Factors: -Group verbal and written instruction about important numbers in your health.  Discussion of what are risk factors and how they play a role in the disease process.  Review of Cholesterol, Blood Pressure, Diabetes, and BMI and the role they play in your overall health.   Core Components/Risk  Factors/Patient Goals Review:    Core Components/Risk Factors/Patient Goals at Discharge (Final Review):    ITP Comments:  ITP Comments    Row Name 04/06/20 1047 04/10/20 1145 04/11/20 1038 04/11/20 1043 04/12/20 0600   ITP Comments Virtual Visit completed. Patient informed on EP and RD appointment and 6 Minute walk test. Patient also informed of patient health questionnaires on My Chart. Patient Verbalizes understanding. Visit diagnosis can be found in [facility]/2021. Completed 6MWT and gym orientation. Initial ITP created and sent for review to Dr. MarkEmily Filbertdical Director. First full day of exercise!  Patient was oriented to gym and equipment including functions, settings, policies, and procedures.  Patient's individual exercise prescription and treatment plan were reviewed.  All starting workloads were established based on the results of the 6 minute walk test done at initial orientation visit.  The plan for exercise progression was also introduced and progression will be customized based on patient's performance and goals. Today JoseKatherineplained of chest discomfort/pain. Center of chest and all down sternum area. He is 4 weeks post surgery. Incision looks healed. JoseCedartes when asked that his sternum is not moving. The discomfort/pain occurred when exercising. He stated that he has been walking at home and does not have this happen then. Advised him to do seated machine exercise and not use his arms.  Only ROM for resistive, no weights.That did help decrease the pain. He sees his surgeon tomorrow and will discuss this discomfort/pain during his visit. 30 Day review completed. Medical Director ITP review done, changes made as directed, and signed approval by Medical Director. New to program   Row Name 05/10/20 1501           ITP Comments 30 day review completed. ITP sent to Dr. MarkEmily Filbertdical Director of Cardiac and Pulmonary Rehab. Continue with ITP unless changes are made by  physician.              Comments: 30 day review

## 2020-05-11 ENCOUNTER — Encounter: Payer: Commercial Managed Care - PPO | Admitting: *Deleted

## 2020-05-11 ENCOUNTER — Other Ambulatory Visit: Payer: Self-pay

## 2020-05-11 DIAGNOSIS — Z952 Presence of prosthetic heart valve: Secondary | ICD-10-CM

## 2020-05-11 DIAGNOSIS — Z951 Presence of aortocoronary bypass graft: Secondary | ICD-10-CM

## 2020-05-11 NOTE — Progress Notes (Signed)
Daily Session Note  Patient Details  Name: Curtis Maxwell MRN: [id number] Date of Birth: 03-11-1961 Referring Provider:     Cardiac Rehab from 04/10/2020 in [facility] Cardiac and Pulmonary Rehab  Referring Provider Isaias Cowman MD      Encounter Date: 05/11/2020  Check In:  Session Check In - 05/11/20 0929      Check-In   Supervising physician immediately available to respond to emergencies See telemetry face sheet for immediately available ER MD    Location [facility]-Cardiac & Pulmonary Rehab    Staff Present Heath Lark, RN, BSN, Jacklynn Bue, MS Exercise Physiologist;Amanda Oletta Darter, [place], ACSM CEP, Exercise Physiologist    Virtual Visit No    Medication changes reported     No    Fall or balance concerns reported    No    Warm-up and Cool-down Performed on first and last piece of equipment    Resistance Training Performed Yes    VAD Patient? No    PAD/SET Patient? No      Pain Assessment   Currently in Pain? No/denies              Social History   Tobacco Use  Smoking Status Former Smoker  . Packs/day: 1.00  . Years: 15.00  . Pack years: 15.00  . Types: E-cigarettes, Cigarettes  . Quit date: 09/06/1998  . Years since quitting: 21.6  Smokeless Tobacco Never Used  Tobacco Comment   stopped smoking vapes 2 months ago    Goals Met:  Independence with exercise equipment Exercise tolerated well No report of cardiac concerns or symptoms  Goals Unmet:  Not Applicable  Comments: Pt able to follow exercise prescription today without complaint.  Will continue to monitor for progression.    Dr. Emily Filbert is Medical Director for [place] [facility].

## 2020-05-24 ENCOUNTER — Telehealth: Payer: Self-pay

## 2020-05-24 NOTE — Telephone Encounter (Signed)
Curtis Maxwell has been out due to insurance changes.  He expects to be able to start back in October.

## 2020-05-31 DIAGNOSIS — Z736 Limitation of activities due to disability: Secondary | ICD-10-CM

## 2020-06-06 ENCOUNTER — Telehealth: Payer: Self-pay | Admitting: *Deleted

## 2020-06-06 ENCOUNTER — Encounter: Payer: 59 | Attending: Cardiology

## 2020-06-06 DIAGNOSIS — Z951 Presence of aortocoronary bypass graft: Secondary | ICD-10-CM | POA: Insufficient documentation

## 2020-06-06 DIAGNOSIS — Z952 Presence of prosthetic heart valve: Secondary | ICD-10-CM | POA: Insufficient documentation

## 2020-06-06 NOTE — Telephone Encounter (Signed)
Called to check on patient.  He has gotten his new insurance card.  He plans to return on Thursday 06/08/20.

## 2020-06-07 ENCOUNTER — Ambulatory Visit: Payer: Commercial Managed Care - PPO | Admitting: Cardiothoracic Surgery

## 2020-06-07 ENCOUNTER — Encounter: Payer: Self-pay | Admitting: *Deleted

## 2020-06-07 DIAGNOSIS — Z952 Presence of prosthetic heart valve: Secondary | ICD-10-CM

## 2020-06-07 DIAGNOSIS — Z951 Presence of aortocoronary bypass graft: Secondary | ICD-10-CM

## 2020-06-07 NOTE — Progress Notes (Signed)
Cardiac Individual Treatment Plan  Patient Details  Name: Curtis Maxwell MRN: [id number] Date of Birth: Jan 02, 1961 Referring Provider:     Cardiac Rehab from 04/10/2020 in [facility] Cardiac and Pulmonary Rehab  Referring Provider Isaias Cowman MD      Initial Encounter Date:    Cardiac Rehab from 04/10/2020 in [facility] Cardiac and Pulmonary Rehab  Date 04/10/20      Visit Diagnosis: S/P CABG x 3  S/P AVR (aortic valve replacement)  Patient's Home Medications on Admission:  Current Outpatient Medications:  .  acetaminophen (TYLENOL) 325 MG tablet, Take 2 tablets (650 mg total) by mouth every 6 (six) hours as needed for mild pain or fever., Disp: , Rfl:  .  amLODipine (NORVASC) 10 MG tablet, Take 1 tablet (10 mg total) by mouth daily., Disp: 30 tablet, Rfl: 2 .  aspirin EC 325 MG EC tablet, Take 1 tablet (325 mg total) by mouth daily., Disp: 30 tablet, Rfl: 0 .  atorvastatin (LIPITOR) 10 MG tablet, Take 1 tablet (10 mg total) by mouth daily., Disp: 30 tablet, Rfl: 1 .  carvedilol (COREG) 3.125 MG tablet, Take 1 tablet (3.125 mg total) by mouth 2 (two) times daily with a meal., Disp: 60 tablet, Rfl: 1 .  Cholecalciferol 50 MCG (2000 UT) CAPS, Take 2,000 Units by mouth daily. , Disp: , Rfl:  .  colchicine 0.6 MG tablet, Take 1 tablet (0.6 mg total) by mouth 2 (two) times daily., Disp: 60 tablet, Rfl: 0 .  ferrous sulfate 325 (65 FE) MG tablet, Take 1 tablet (325 mg total) by mouth daily with breakfast. For one month then stop. If develops constipation, may stop sooner, Disp: 30 tablet, Rfl: 3 .  furosemide (LASIX) 40 MG tablet, Take 1 tablet (40 mg total) by mouth daily for 14 days., Disp: 14 tablet, Rfl: 2 .  lisinopril (ZESTRIL) 40 MG tablet, Take 40 mg by mouth daily., Disp: , Rfl:  .  metFORMIN (GLUCOPHAGE) 500 MG tablet, Take 500 mg by mouth daily. , Disp: , Rfl:  .  Omega-3 Fatty Acids (FISH OIL) 1200 MG CAPS, Take 1,200 mg by mouth 2 (two) times a week. , Disp: , Rfl:  .  omeprazole  (PRILOSEC) 20 MG capsule, Take 20-40 mg by mouth daily as needed. , Disp: , Rfl:  .  sildenafil (REVATIO) 20 MG tablet, Take 60-80 mg by mouth daily as needed (erectile dysfuntion.). , Disp: , Rfl:  .  traMADol (ULTRAM) 50 MG tablet, Take 1 tablet (50 mg total) by mouth every 6 (six) hours as needed., Disp: 40 tablet, Rfl: 0  Past Medical History: Past Medical History:  Diagnosis Date  . Coronary artery disease   . High cholesterol   . Hypertension   . Myocardial infarct, old   . Plantar fasciitis     Tobacco Use: Social History   Tobacco Use  Smoking Status Former Smoker  . Packs/day: 1.00  . Years: 15.00  . Pack years: 15.00  . Types: E-cigarettes, Cigarettes  . Quit date: 09/06/1998  . Years since quitting: 21.7  Smokeless Tobacco Never Used  Tobacco Comment   stopped smoking vapes 2 months ago    Labs: Recent Review Flowsheet Data    Labs for ITP Cardiac and Pulmonary Rehab Latest Ref Rng & Units 03/06/2020 03/07/2020 03/08/2020 03/09/2020 03/10/2020   Hemoglobin A1c 4.8 - 5.6 % - - - - -   PHART 7.35 - 7.45 - - - - -   PCO2ART 32 - 48 mmHg - - - - -  HCO3 20.0 - 28.0 mmol/L - - - - -   TCO2 22 - 32 mmol/L - - - - -   ACIDBASEDEF 0.0 - 2.0 mmol/L - - - - -   O2SAT % 58.3 58.8 65.6 49.9 54.7       Exercise Target Goals: Exercise Program Goal: Individual exercise prescription set using results from initial 6 min walk test and THRR while considering  patient's activity barriers and safety.   Exercise Prescription Goal: Initial exercise prescription builds to 30-45 minutes a day of aerobic activity, 2-3 days per week.  Home exercise guidelines will be given to patient during program as part of exercise prescription that the participant will acknowledge.   Education: Aerobic Exercise & Resistance Training: - Gives group verbal and written instruction on the various components of exercise. Focuses on aerobic and resistive training programs and the benefits of this training and  how to safely progress through these programs..   Cardiac Rehab from 05/11/2020 in [facility] Cardiac and Pulmonary Rehab  Date 04/20/20  Educator [facility]  Instruction Review Code 1- Verbalizes Understanding  [8/26 resistance]      Education: Exercise & Equipment Safety: - Individual verbal instruction and demonstration of equipment use and safety with use of the equipment.   Cardiac Rehab from 05/11/2020 in [facility] Cardiac and Pulmonary Rehab  Date 04/06/20  Educator [facility]  Instruction Review Code 1- Verbalizes Understanding      Education: Exercise Physiology & General Exercise Guidelines: - Group verbal and written instruction with models to review the exercise physiology of the cardiovascular system and associated critical values. Provides general exercise guidelines with specific guidelines to those with heart or lung disease.    Cardiac Rehab from 05/11/2020 in [facility] Cardiac and Pulmonary Rehab  Date 04/10/20  Instruction Review Code 3- Needs Reinforcement  [need identified]      Education: Flexibility, Balance, Mind/Body Relaxation: Provides group verbal/written instruction on the benefits of flexibility and balance training, including mind/body exercise modes such as yoga, pilates and tai chi.  Demonstration and skill practice provided.   Cardiac Rehab from 05/11/2020 in [facility] Cardiac and Pulmonary Rehab  Date 05/04/20  Educator [facility]  Instruction Review Code 1- Verbalizes Understanding      Activity Barriers & Risk Stratification:  Activity Barriers & Cardiac Risk Stratification - 04/10/20 1147      Activity Barriers & Cardiac Risk Stratification   Activity Barriers Incisional Pain;Shortness of Breath;Deconditioning;Joint Problems   shoulders sore from surgery   Cardiac Risk Stratification High           6 Minute Walk:  6 Minute Walk    Row Name 04/10/20 1146         6 Minute Walk   Phase Initial     Distance 1059 feet     Walk Time 6 minutes     # of Rest Breaks 0     MPH 2      METS 3.3     RPE 13     Perceived Dyspnea  3     VO2 Peak 11.56     Symptoms Yes (comment)     Comments chest burning 8/10, hard to breathe/SOB     Resting HR 73 bpm     Resting BP 122/66     Resting Oxygen Saturation  99 %     Exercise Oxygen Saturation  during 6 min walk 99 %     Max Ex. HR 107 bpm     Max Ex. BP  174/84     2 Minute Post BP 142/70            Oxygen Initial Assessment:   Oxygen Re-Evaluation:   Oxygen Discharge (Final Oxygen Re-Evaluation):   Initial Exercise Prescription:  Initial Exercise Prescription - 04/10/20 1100      Date of Initial Exercise RX and Referring Provider   Date 04/10/20    Referring Provider Paraschos, Alexander MD      Treadmill   MPH 2    Grade 1    Minutes 15    METs 2.81      Recumbant Elliptical   Level 1    RPM 50    Minutes 15    METs 2.8      REL-XR   Level 2    Speed 50    Minutes 15    METs 3      Prescription Details   Frequency (times per week) 2    Duration Progress to 30 minutes of continuous aerobic without signs/symptoms of physical distress      Intensity   THRR 40-80% of Max Heartrate 109-144    Ratings of Perceived Exertion 11-13    Perceived Dyspnea 0-4      Progression   Progression Continue to progress workloads to maintain intensity without signs/symptoms of physical distress.      Resistance Training   Training Prescription Yes    Weight 3 lb    Reps 10-15           Perform Capillary Blood Glucose checks as needed.  Exercise Prescription Changes:  Exercise Prescription Changes    Row Name 04/10/20 1100 04/12/20 1400 04/26/20 1400 05/11/20 1100       Response to Exercise   Blood Pressure (Admit) 122/66 110/62 140/74 118/64    Blood Pressure (Exercise) 174/84 142/84 132/82 152/70    Blood Pressure (Exit) 142/70 128/64 136/70 126/60    Heart Rate (Admit) 73 bpm 81 bpm 82 bpm 76 bpm    Heart Rate (Exercise) 107 bpm 106 bpm 109 bpm 106 bpm    Heart Rate (Exit) 73 bpm 83 bpm  76 bpm 61 bpm    Oxygen Saturation (Admit) 99 % -- -- --    Oxygen Saturation (Exercise) 99 % -- -- --    Rating of Perceived Exertion (Exercise) 13 15 13 13     Perceived Dyspnea (Exercise) 3 -- -- --    Symptoms chest buring (8/10), SOB/hard to breathe chest burning chest burning none    Comments walk test results first full day of exercise -- --    Duration -- Progress to 30 minutes of  aerobic without signs/symptoms of physical distress Progress to 30 minutes of  aerobic without signs/symptoms of physical distress Continue with 30 min of aerobic exercise without signs/symptoms of physical distress.    Intensity -- THRR unchanged THRR unchanged THRR unchanged      Progression   Progression -- Continue to progress workloads to maintain intensity without signs/symptoms of physical distress. Continue to progress workloads to maintain intensity without signs/symptoms of physical distress. Continue to progress workloads to maintain intensity without signs/symptoms of physical distress.    Average METs -- 2.81 2.15 1.87      Resistance Training   Training Prescription -- Yes Yes Yes    Weight -- ROM ROM 3 lb    Reps -- 10-15 10-15 10-15      Interval Training   Interval Training -- No No No  Treadmill   MPH -- 2 2 1.5    Grade -- 1 1 0    Minutes -- 15 15 15     METs -- 2.81 2.81 2.15      Recumbant Elliptical   Level -- 1 1 1     RPM -- -- 50 --    Minutes -- 15 15 15     METs -- -- 1.5 1.6      Home Exercise Plan   Plans to continue exercise at -- -- -- Home (comment)    Frequency -- -- -- Add 2 additional days to program exercise sessions.    Initial Home Exercises Provided -- -- -- 05/11/20           Exercise Comments:  Exercise Comments    Row Name 04/11/20 1039 04/11/20 1044         Exercise Comments First full day of exercise!  Patient was oriented to gym and equipment including functions, settings, policies, and procedures.  Patient's individual exercise  prescription and treatment plan were reviewed.  All starting workloads were established based on the results of the 6 minute walk test done at initial orientation visit.  The plan for exercise progression was also introduced and progression will be customized based on patient's performance and goals. Today Curtis Maxwell complained of chest discomfort/pain. Center of chest and all down sternum area. He is 4 weeks post surgery. Incision looks healed. Vi states when asked that his sternum is not moving. The discomfort/pain occurred when exercising. He stated that he has been walking at home and does not have this happen then. Advised him to do seated machine exercise and not use his arms.  Only ROM for resistive, no weights.That did help decrease the pain. He sees his surgeon tomorrow and will discuss this discomfort/pain during his visit.             Exercise Goals and Review:  Exercise Goals    Row Name 04/10/20 1149             Exercise Goals   Increase Physical Activity Yes       Intervention Develop an individualized exercise prescription for aerobic and resistive training based on initial evaluation findings, risk stratification, comorbidities and participant's personal goals.;Provide advice, education, support and counseling about physical activity/exercise needs.       Expected Outcomes Short Term: Attend rehab on a regular basis to increase amount of physical activity.;Long Term: Add in home exercise to make exercise part of routine and to increase amount of physical activity.;Long Term: Exercising regularly at least 3-5 days a week.       Increase Strength and Stamina Yes       Intervention Provide advice, education, support and counseling about physical activity/exercise needs.;Develop an individualized exercise prescription for aerobic and resistive training based on initial evaluation findings, risk stratification, comorbidities and participant's personal goals.       Expected Outcomes  Short Term: Increase workloads from initial exercise prescription for resistance, speed, and METs.;Short Term: Perform resistance training exercises routinely during rehab and add in resistance training at home;Long Term: Improve cardiorespiratory fitness, muscular endurance and strength as measured by increased METs and functional capacity (6MWT)       Able to understand and use rate of perceived exertion (RPE) scale Yes       Intervention Provide education and explanation on how to use RPE scale       Expected Outcomes Short Term: Able to use RPE daily in rehab  to express subjective intensity level;Long Term:  Able to use RPE to guide intensity level when exercising independently       Able to understand and use Dyspnea scale Yes       Intervention Provide education and explanation on how to use Dyspnea scale       Expected Outcomes Long Term: Able to use Dyspnea scale to guide intensity level when exercising independently;Short Term: Able to use Dyspnea scale daily in rehab to express subjective sense of shortness of breath during exertion       Knowledge and understanding of Target Heart Rate Range (THRR) Yes       Intervention Provide education and explanation of THRR including how the numbers were predicted and where they are located for reference       Expected Outcomes Short Term: Able to state/look up THRR;Short Term: Able to use daily as guideline for intensity in rehab;Long Term: Able to use THRR to govern intensity when exercising independently       Able to check pulse independently Yes       Intervention Provide education and demonstration on how to check pulse in carotid and radial arteries.;Review the importance of being able to check your own pulse for safety during independent exercise       Expected Outcomes Short Term: Able to explain why pulse checking is important during independent exercise;Long Term: Able to check pulse independently and accurately       Understanding of Exercise  Prescription Yes       Intervention Provide education, explanation, and written materials on patient's individual exercise prescription       Expected Outcomes Short Term: Able to explain program exercise prescription;Long Term: Able to explain home exercise prescription to exercise independently              Exercise Goals Re-Evaluation :  Exercise Goals Re-Evaluation    Row Name 04/11/20 1039 04/26/20 1418 04/26/20 1420 05/11/20 1121       Exercise Goal Re-Evaluation   Exercise Goals Review Understanding of Exercise Prescription;Knowledge and understanding of Target Heart Rate Range (THRR);Able to understand and use rate of perceived exertion (RPE) scale Understanding of Exercise Prescription;Knowledge and understanding of Target Heart Rate Range (THRR);Able to understand and use rate of perceived exertion (RPE) scale -- Increase Physical Activity;Increase Strength and Stamina;Knowledge and understanding of Target Heart Rate Range (THRR);Able to check pulse independently;Understanding of Exercise Prescription    Comments Reviewed RPE and dyspnea scales, THR and program prescription with pt today.  Pt voiced understanding and was given a copy of goals to take home. Curtis Maxwell has had some chest burning with exercise. He ha sspoken with his Dr about this and they believe it is coming form his incision. Reviewed home exercise with pt today.  Pt plans to walk and use stationary bike for exercise.  Reviewed THR, pulse, RPE, sign and symptoms, pulse oximetery and when to call 911 or MD.  Also discussed weather considerations and indoor options.  Pt voiced understanding.    Expected Outcomes Short: Use RPE daily to regulate intensity. Long: Follow program prescription in THR. -- Short: attend consistently Long: exercise without symptoms Short: practice checking HR during exercise Long:  monitor vitals during exercise           Discharge Exercise Prescription (Final Exercise Prescription Changes):   Exercise Prescription Changes - 05/11/20 1100      Response to Exercise   Blood Pressure (Admit) 118/64    Blood Pressure (Exercise)  152/70    Blood Pressure (Exit) 126/60    Heart Rate (Admit) 76 bpm    Heart Rate (Exercise) 106 bpm    Heart Rate (Exit) 61 bpm    Rating of Perceived Exertion (Exercise) 13    Symptoms none    Duration Continue with 30 min of aerobic exercise without signs/symptoms of physical distress.    Intensity THRR unchanged      Progression   Progression Continue to progress workloads to maintain intensity without signs/symptoms of physical distress.    Average METs 1.87      Resistance Training   Training Prescription Yes    Weight 3 lb    Reps 10-15      Interval Training   Interval Training No      Treadmill   MPH 1.5    Grade 0    Minutes 15    METs 2.15      Recumbant Elliptical   Level 1    Minutes 15    METs 1.6      Home Exercise Plan   Plans to continue exercise at Home (comment)    Frequency Add 2 additional days to program exercise sessions.    Initial Home Exercises Provided 05/11/20           Nutrition:  Target Goals: Understanding of nutrition guidelines, daily intake of sodium <1532m, cholesterol <2042m calories 30% from fat and 7% or less from saturated fats, daily to have 5 or more servings of fruits and vegetables.  Education: Controlling Sodium/Reading Food Labels -Group verbal and written material supporting the discussion of sodium use in heart healthy nutrition. Review and explanation with models, verbal and written materials for utilization of the food label.   Education: General Nutrition Guidelines/Fats and Fiber: -Group instruction provided by verbal, written material, models and posters to present the general guidelines for heart healthy nutrition. Gives an explanation and review of dietary fats and fiber.   [facility]  Date [facility]/11[facility]20  [facility] Review Code 1- Verbalizes Understanding  [need identified]      Biometrics:  Pre Biometrics - 04/10/20 1150      Pre Biometrics   Height 5' 5.8" (1.671 m)    Weight 193 lb 9 oz (87.8 kg)    BMI (Calculated) 31.44    Single Leg Stand 30 seconds            Nutrition Therapy Plan and Nutrition Goals:  Nutrition Therapy & Goals - 04/18/20 1018      Nutrition Therapy   Diet Heart healthy, low Na    Drug/Food Interactions Statins/Certain Fruits    Protein (specify units) 70g    Fiber 30 grams    Whole Grain Foods 3 servings    Saturated Fats 12 max. grams    Fruits and Vegetables 5 servings/day    Sodium 1.5 grams      Personal Nutrition Goals   Nutrition Goal LTBW:IOMBTDHithout breathing pain and chest pain.    Comments Pt primary cardiac rehab diagnosis is s/p CABG x3. Pt also presents with CAD, high cholesterol, HTN, GERD,  vitamin D deficiancy and low B12 -2020, T2DM (A1C 6.1). Alcohol 10/week - beer. Current relevant medications: lipitor, lasix (short term), metformin, iron, vitamin D. No longer deficient in vitamin D and B12.  B: pancakes with eggs and tu[place]age L: eggs on english muffin or polish tu[place]age and hotdog bun D: makes pizza yesterday.  discussed heart healthy eating, pt would not like to make any changes at this time.      Intervention Plan   Intervention Prescribe, educate and counsel regarding individualized specific dietary modifications aiming towards targeted core components such as weight, hypertension, lipid management, diabetes, heart failure and other comorbidities.;Nutrition handout(s) given to patient.    Expected Outcomes Short Term Goal: Understand basic principles of dietary content, such as calories, fat, sodium, cholesterol and nutrients.;Short Term Goal: A plan has been developed with personal nutrition goals set during dietitian appointment.;Long Term Goal: Adherence to prescribed nutrition plan.           Nutrition  Assessments:  Nutrition Assessments - 04/10/20 1151      MEDFICTS Scores   Pre Score 47           MEDIFICTS Score Key:          ?70 Need to make dietary changes          40-70 Heart Healthy Diet         ? 40 Therapeutic Level Cholesterol Diet  Nutrition Goals Re-Evaluation:   Nutrition Goals Discharge (Final Nutrition Goals Re-Evaluation):   Psychosocial: Target Goals: Acknowledge presence or absence of significant depression and/or stress, maximize coping skills, provide positive support system. Participant is able to verbalize types and ability to use techniques and skills needed for reducing stress and depression.   Education: Depression - Provides group verbal and written instruction on the correlation between heart/lung disease and depressed mood, treatment options, and the stigmas associated with seeking treatment.   Education: Sleep Hygiene -Provides group verbal and written instruction about how sleep can affect your health.  Define sleep hygiene, discuss sleep cycles and impact of sleep habits. Review good sleep hygiene tips.     Education: Stress and Anxiety: - Provides group verbal and written instruction about the health risks of elevated stress and causes of high stress.  Discuss the correlation between heart/lung disease and anxiety and treatment options. Review healthy ways to manage with stress and anxiety.    Initial Review & Psychosocial Screening:  Initial Psych Review & Screening - 04/06/20 1042      Initial Review   Current issues with None Identified      Family Dynamics   Good Support System? Yes    Comments He can look to his wife and church family for support. He states that he does not need a shrink but exercsie.      Barriers   Psychosocial barriers to participate in program The patient should benefit from training in stress management and relaxation.;There are no identifiable barriers or psychosocial needs.      Screening Interventions    Interventions Encouraged to exercise;To provide support and resources with identified psychosocial needs;Provide feedback about the scores to participant    Expected Outcomes Short Term goal: Utilizing psychosocial counselor, staff and physician to assist with identification of specific Stressors or current issues interfering with healing process. Setting desired goal for each stressor or current issue identified.;Long Term Goal: Stressors or current issues are controlled or eliminated.;Short Term goal: Identification and review with participant of any Quality of Life or Depression concerns found by scoring the questionnaire.;Long Term goal: The participant improves quality of Life and PHQ9 Scores as seen by post scores and/or verbalization of changes           Quality of Life Scores:   Quality of Life - 04/10/20 1150      Quality of Life  Select Quality of Life      Quality of Life Scores   Health/Function Pre 17.57 %    Socioeconomic Pre 23.5 %    Psych/Spiritual Pre 21.43 %    Family Pre 24 %    GLOBAL Pre 20.44 %          Scores of 19 and below usually indicate a poorer quality of life in these areas.  A difference of  2-3 points is a clinically meaningful difference.  A difference of 2-3 points in the total score of the Quality of Life Index has been associated with significant improvement in overall quality of life, self-image, physical symptoms, and general health in studies assessing change in quality of life.  PHQ-9: Recent Review Flowsheet Data    Depression screen [facility] 2/9 04/10/2020   Decreased Interest 0   Down, Depressed, Hopeless 1   PHQ - 2 Score 1   Altered sleeping 2   Tired, decreased energy 1   Change in appetite 0   Feeling bad or failure about yourself  0   Trouble concentrating 0   Moving slowly or fidgety/restless 1   Suicidal thoughts 0   PHQ-9 Score 5   Difficult doing work/chores Not difficult at all     Interpretation of Total Score  Total Score  Depression Severity:  1-4 = Minimal depression, 5-9 = Mild depression, 10-14 = Moderate depression, 15-19 = Moderately severe depression, 20-27 = Severe depression   Psychosocial Evaluation and Intervention:  Psychosocial Evaluation - 04/06/20 1046      Psychosocial Evaluation & Interventions   Interventions Therapist referral;Encouraged to exercise with the program and follow exercise prescription;Relaxation education;Stress management education    Comments He can look to his wife and church family for support. He states that he does not need a shrink but exercsie.    Expected Outcomes Short: Exercise regularly to support mental health and notify staff of any changes. Long: maintain mental health and well being through teaching of rehab or prescribed medications independently.    Continue Psychosocial Services  Follow up required by staff           Psychosocial Re-Evaluation:   Psychosocial Discharge (Final Psychosocial Re-Evaluation):   Vocational Rehabilitation: Provide vocational rehab assistance to qualifying candidates.   Vocational Rehab Evaluation & Intervention:   Education: Education Goals: Education classes will be provided on a variety of topics geared toward better understanding of heart health and risk factor modification. Participant will state understanding/return demonstration of topics presented as noted by education test scores.  Learning Barriers/Preferences:  Learning Barriers/Preferences - 04/06/20 1041      Learning Barriers/Preferences   Learning Barriers None    Learning Preferences None           General Cardiac Education Topics:  AED/CPR: - Group verbal and written instruction with the use of models to demonstrate the basic use of the AED with the basic ABC's of resuscitation.   Anatomy & Physiology of the Heart: - Group verbal and written instruction and models provide basic cardiac anatomy and physiology, with the coronary electrical and  arterial systems. Review of Valvular disease and Heart Failure   Cardiac Rehab from 05/11/2020 in [facility] Cardiac and Pulmonary Rehab  Date 04/27/20  Educator SB  Instruction Review Code 1- Verbalizes Understanding  [need identified]      Cardiac Procedures: - Group verbal and written instruction to review commonly prescribed medications for heart disease. Reviews the medication, class of the drug, and side effects.  Includes the steps to properly store meds and maintain the prescription regimen. (beta blockers and nitrates)   Cardiac Rehab from 05/11/2020 in [facility] Cardiac and Pulmonary Rehab  Date 04/27/20  Educator SB  Instruction Review Code 1- Verbalizes Understanding      Cardiac Medications I: - Group verbal and written instruction to review commonly prescribed medications for heart disease. Reviews the medication, class of the drug, and side effects. Includes the steps to properly store meds and maintain the prescription regimen.   Cardiac Medications II: -Group verbal and written instruction to review commonly prescribed medications for heart disease. Reviews the medication, class of the drug, and side effects. (all other drug classes)    Go Sex-Intimacy & Heart Disease, Get SMART - Goal Setting: - Group verbal and written instruction through game format to discuss heart disease and the return to sexual intimacy. Provides group verbal and written material to discuss and apply goal setting through the application of the S.M.A.R.T. Method.   Cardiac Rehab from 05/11/2020 in [facility] Cardiac and Pulmonary Rehab  Date 04/27/20  Educator SB  Instruction Review Code 1- Verbalizes Understanding      Other Matters of the Heart: - Provides group verbal, written materials and models to describe Stable Angina and Peripheral Artery. Includes description of the disease process and treatment options available to the cardiac patient.   Infection Prevention: - Provides verbal and written material to  individual with discussion of infection control including proper hand washing and proper equipment cleaning during exercise session.   Cardiac Rehab from 05/11/2020 in [facility] Cardiac and Pulmonary Rehab  Date 04/06/20  Educator [facility]  Instruction Review Code 1- Verbalizes Understanding      Falls Prevention: - Provides verbal and written material to individual with discussion of falls prevention and safety.   Cardiac Rehab from 05/11/2020 in [facility] Cardiac and Pulmonary Rehab  Date 04/06/20  Educator [facility]  Instruction Review Code 1- Verbalizes Understanding      Other: -Provides group and verbal instruction on various topics (see comments)   Knowledge Questionnaire Score:  Knowledge Questionnaire Score - 04/10/20 1151      Knowledge Questionnaire Score   Pre Score 21/26 Education Focus: Nutrition, Exercise, Angina, MI           Core Components/Risk Factors/Patient Goals at Admission:  Personal Goals and Risk Factors at Admission - 04/10/20 1152      Core Components/Risk Factors/Patient Goals on Admission    Weight Management Yes;Weight Loss;Obesity    Intervention Weight Management: Develop a combined nutrition and exercise program designed to reach desired caloric intake, while maintaining appropriate intake of nutrient and fiber, sodium and fats, and appropriate energy expenditure required for the weight goal.;Weight Management: Provide education and appropriate resources to help participant work on and attain dietary goals.;Weight Management/Obesity: Establish reasonable short term and long term weight goals.;Obesity: Provide education and appropriate resources to help participant work on and attain dietary goals.    Admit Weight 193 lb 9 oz (87.8 kg)    Goal Weight: Short Term 188 lb (85.3 kg)    Goal Weight: Long Term 180 lb (81.6 kg)    Expected Outcomes Short Term: Continue to assess and modify interventions until short term weight is achieved;Long Term: Adherence to nutrition and  physical activity/exercise program aimed toward attainment of established weight goal;Weight Loss: Understanding of general recommendations for a balanced deficit meal plan, which promotes 1-2 lb weight loss per week and includes a negative energy balance of [phone] kcal/d;Understanding recommendations  for meals to include 15-35% energy as protein, 25-35% energy from fat, 35-60% energy from carbohydrates, less than 28m of dietary cholesterol, 20-35 gm of total fiber daily;Understanding of distribution of calorie intake throughout the day with the consumption of 4-5 meals/snacks    Diabetes Yes    Intervention Provide education about signs/symptoms and action to take for hypo/hyperglycemia.;Provide education about proper nutrition, including hydration, and aerobic/resistive exercise prescription along with prescribed medications to achieve blood glucose in normal ranges: Fasting glucose 65-99 mg/dL    Expected Outcomes Short Term: Participant verbalizes understanding of the signs/symptoms and immediate care of hyper/hypoglycemia, proper foot care and importance of medication, aerobic/resistive exercise and nutrition plan for blood glucose control.;Long Term: Attainment of HbA1C < 7%.    Hypertension Yes    Intervention Provide education on lifestyle modifcations including regular physical activity/exercise, weight management, moderate sodium restriction and increased consumption of fresh fruit, vegetables, and low fat dairy, alcohol moderation, and smoking cessation.;Monitor prescription use compliance.    Expected Outcomes Short Term: Continued assessment and intervention until BP is < 140/953mHG in hypertensive participants. < 130/8034mG in hypertensive participants with diabetes, heart failure or chronic kidney disease.;Long Term: Maintenance of blood pressure at goal levels.    Lipids Yes    Intervention Provide education and support for participant on nutrition & aerobic/resistive exercise along  with prescribed medications to achieve LDL <34m38mDL >40mg53m Expected Outcomes Short Term: Participant states understanding of desired cholesterol values and is compliant with medications prescribed. Participant is following exercise prescription and nutrition guidelines.;Long Term: Cholesterol controlled with medications as prescribed, with individualized exercise RX and with personalized nutrition plan. Value goals: LDL < 34mg,63m > 40 mg.           Education:Diabetes - Individual verbal and written instruction to review signs/symptoms of diabetes, desired ranges of glucose level fasting, after meals and with exercise. Acknowledge that pre and post exercise glucose checks will be done for 3 sessions at entry of program.   [facility]  Date 04/06/20  [facility]- Verbalizes Understanding      Education: Know Your Numbers and Risk Factors: -Group verbal and written instruction about important numbers in your health.  Discussion of what are risk factors and how they play a role in the disease process.  Review of Cholesterol, Blood Pressure, Diabetes, and BMI and the role they play in your overall health.   Core Components/Risk Factors/Patient Goals Review:    Core Components/Risk Factors/Patient Goals at Discharge (Final Review):    ITP Comments:  ITP Comments    Row Name 04/06/20 1047 04/10/20 1145 04/11/20 1038 04/11/20 1043 04/12/20 0600   ITP Comments Virtual Visit completed. Patient informed on EP and RD appointment and 6 Minute walk test. Patient also informed of patient health questionnaires on My Chart. Patient Verbalizes understanding. Visit diagnosis can be found in [facility] 7/[facility]021. Completed 6MWT and gym orientation. Initial ITP created and sent for review to Dr. Mark MEmily Filbertcal Director. First full day of exercise!  Patient was oriented to gym and equipment including functions, settings,  policies, and procedures.  Patient's individual exercise prescription and treatment plan were reviewed.  All starting workloads were established based on the results of the 6 minute walk test done at initial orientation visit.  The plan for exercise progression was also introduced and progression will be customized based on patient's performance and goals. Today Curtis Maxwell  complained of chest discomfort/pain. Center of chest and all down sternum area. He is 4 weeks post surgery. Incision looks healed. Curtis Maxwell states when asked that his sternum is not moving. The discomfort/pain occurred when exercising. He stated that he has been walking at home and does not have this happen then. Advised him to do seated machine exercise and not use his arms.  Only ROM for resistive, no weights.That did help decrease the pain. He sees his surgeon tomorrow and will discuss this discomfort/pain during his visit. 30 Day review completed. Medical Director ITP review done, changes made as directed, and signed approval by Medical Director. New to program   Row Name 05/10/20 1501 06/07/20 0530         ITP Comments 30 day review completed. ITP sent to Dr. Emily Filbert, Medical Director of Cardiac and Pulmonary Rehab. Continue with ITP unless changes are made by physician. 30 Day review completed. Medical Director ITP review done, changes made as directed, and signed approval by Medical Director.             Comments:

## 2020-06-08 ENCOUNTER — Encounter: Payer: 59 | Admitting: *Deleted

## 2020-06-08 ENCOUNTER — Other Ambulatory Visit: Payer: Self-pay

## 2020-06-08 DIAGNOSIS — Z951 Presence of aortocoronary bypass graft: Secondary | ICD-10-CM | POA: Diagnosis not present

## 2020-06-08 DIAGNOSIS — Z952 Presence of prosthetic heart valve: Secondary | ICD-10-CM | POA: Diagnosis not present

## 2020-06-08 NOTE — Progress Notes (Signed)
Daily Session Note  Patient Details  Name: Curtis Maxwell MRN: [id number] Date of Birth: July 09, 1961 Referring Provider:     Card[facility] Card[facility] Re[facility]  Referring Provider Isaias Cowman MD      Encounter Date: 06/08/2020  Check In:  Session Check In - 06/08/20 1003      Check-In   Supervising physician immediately available to respond to emergencies See telemetry face sheet for immediately available ER MD    Location [facility]-Cardiac & Pulmonary Rehab    Staff Present Renita Papa, RN Margurite Auerbach, MS Exercise Physiologist;Jessica Luan Pulling, MA, RCEP, CCRP, CCET;Melissa [place] RDN, Rowe Pavy, [place], ACSM CEP, Exercise Physiologist    Virtual Visit No    Medication changes reported     No    Fall or balance concerns reported    No    Warm-up and Cool-down Performed on first and last piece of equipment    Resistance Training Performed Yes    VAD Patient? No    PAD/SET Patient? No      Pain Assessment   Currently in Pain? No/denies              Social History   Tobacco Use  Smoking Status Former Smoker  . Packs/day: 1.00  . Years: 15.00  . Pack years: 15.00  . Types: E-cigarettes, Cigarettes  . Quit date: 09/06/1998  . Years since quitting: 21.7  Smokeless Tobacco Never Used  Tobacco Comment   stopped smoking vapes 2 months ago    Goals Met:  Independence with exercise equipment Exercise tolerated well No report of cardiac concerns or symptoms Strength training completed today  Goals Unmet:  Not Applicable  Comments: Pt able to follow exercise prescription today without complaint.  Will continue to monitor for progression.    Dr. Emily Filbert is Medical Director for [place] and [facility].

## 2020-06-12 ENCOUNTER — Ambulatory Visit (LOCAL_COMMUNITY_HEALTH_CENTER): Payer: Self-pay

## 2020-06-12 ENCOUNTER — Other Ambulatory Visit: Payer: Self-pay | Admitting: Cardiothoracic Surgery

## 2020-06-12 ENCOUNTER — Other Ambulatory Visit: Payer: Self-pay

## 2020-06-12 DIAGNOSIS — Z23 Encounter for immunization: Secondary | ICD-10-CM

## 2020-06-13 ENCOUNTER — Encounter: Payer: 59 | Admitting: *Deleted

## 2020-06-13 DIAGNOSIS — Z951 Presence of aortocoronary bypass graft: Secondary | ICD-10-CM

## 2020-06-13 DIAGNOSIS — Z952 Presence of prosthetic heart valve: Secondary | ICD-10-CM | POA: Diagnosis not present

## 2020-06-13 NOTE — Progress Notes (Signed)
Daily Session Note  Patient Details  Name: Curtis Maxwell MRN: [id number] Date of Birth: [birth year] Referring Provider:     Cardiac Rehab from 04/10/2020 in [facility] Cardiac and Pulmonary Rehab  Referring Provider Isaias Cowman MD      Encounter Date: 06/13/2020  Check In:  Session Check In - 06/13/20 1008      Check-In   Supervising physician immediately available to respond to emergencies See telemetry face sheet for immediately available ER MD    Location [facility]-Cardiac & Pulmonary Rehab    Staff Present Heath Lark, RN, BSN, Jacklynn Bue, MS Exercise Physiologist;Amanda Oletta Darter, [place], ACSM CEP, Exercise Physiologist    Virtual Visit No    Medication changes reported     No    Fall or balance concerns reported    No    Warm-up and Cool-down Performed on first and last piece of equipment    Resistance Training Performed Yes    VAD Patient? No    PAD/SET Patient? No      Pain Assessment   Currently in Pain? No/denies              Social History   Tobacco Use  Smoking Status Former Smoker  . Packs/day: 1.00  . Years: 15.00  . Pack years: 15.00  . Types: E-cigarettes, Cigarettes  . Quit date: 09/06/1998  . Years since quitting: 21.7  Smokeless Tobacco Never Used  Tobacco Comment   stopped smoking vapes 2 months ago    Goals Met:  Independence with exercise equipment Exercise tolerated well No report of cardiac concerns or symptoms  Goals Unmet:  Not Applicable  Comments: Pt able to follow exercise prescription today without complaint.  Will continue to monitor for progression.    Dr. Emily Filbert is Medical Director for [place] and [facility].

## 2020-06-14 ENCOUNTER — Ambulatory Visit: Payer: Commercial Managed Care - PPO | Admitting: Cardiothoracic Surgery

## 2020-06-15 ENCOUNTER — Other Ambulatory Visit: Payer: Self-pay

## 2020-06-15 ENCOUNTER — Encounter: Payer: 59 | Admitting: *Deleted

## 2020-06-15 DIAGNOSIS — Z951 Presence of aortocoronary bypass graft: Secondary | ICD-10-CM

## 2020-06-15 DIAGNOSIS — Z952 Presence of prosthetic heart valve: Secondary | ICD-10-CM

## 2020-06-15 NOTE — Progress Notes (Signed)
Daily Session Note  Patient Details  Name: Curtis Maxwell MRN: [id number] Date of Birth: 17-Aug-1961 Referring Provider:     Cardiac Rehab from 04/10/2020 in [facility] Cardiac and Pulmonary Rehab  Referring Provider Isaias Cowman MD      Encounter Date: 06/15/2020  Check In:  Session Check In - 06/15/20 1045      Check-In   Supervising physician immediately available to respond to emergencies See telemetry face sheet for immediately available ER MD    Location [facility]-Cardiac & Pulmonary Rehab    Staff Present Heath Lark, RN, BSN, CCRP;Amanda Sommer, BA, ACSM CEP, Exercise Physiologist;Melissa Caiola RDN, LDN    Virtual Visit No    Medication changes reported     No    Fall or balance concerns reported    No    Warm-up and Cool-down Performed on first and last piece of equipment    Resistance Training Performed Yes    VAD Patient? No    PAD/SET Patient? No      Pain Assessment   Currently in Pain? No/denies              Social History   Tobacco Use  Smoking Status Former Smoker  . Packs/day: 1.00  . Years: 15.00  . Pack years: 15.00  . Types: E-cigarettes, Cigarettes  . Quit date: 09/06/1998  . Years since quitting: 21.7  Smokeless Tobacco Never Used  Tobacco Comment   stopped smoking vapes 2 months ago    Goals Met:  Independence with exercise equipment Exercise tolerated well No report of cardiac concerns or symptoms  Goals Unmet:  Not Applicable  Comments: Pt able to follow exercise prescription today without complaint.  Will continue to monitor for progression.    Dr. Emily Filbert is Medical Director for [place] and [facility].

## 2020-06-20 ENCOUNTER — Other Ambulatory Visit: Payer: Self-pay

## 2020-06-20 ENCOUNTER — Encounter: Payer: 59 | Admitting: *Deleted

## 2020-06-20 DIAGNOSIS — Z952 Presence of prosthetic heart valve: Secondary | ICD-10-CM | POA: Diagnosis not present

## 2020-06-20 DIAGNOSIS — Z951 Presence of aortocoronary bypass graft: Secondary | ICD-10-CM

## 2020-06-20 NOTE — Progress Notes (Signed)
Daily Session Note  Patient Details  Name: Curtis Maxwell MRN: [id number] Date of Birth: 02/07/1961 Referring Provider:     Cardiac Rehab from 04/10/2020 in [facility] Cardiac and Pulmonary Rehab  Referring Provider Paraschos, Alexander MD      Encounter Date: 06/20/2020  Check In:  Session Check In - 06/20/20 1011      Check-In   Supervising physician immediately available to respond to emergencies See telemetry face sheet for immediately available ER MD    Location [facility]-Cardiac & Pulmonary Rehab    Staff Present Kara Langdon, MS Exercise Physiologist;Amanda Sommer, BA, ACSM CEP, Exercise Physiologist    Virtual Visit No    Medication changes reported     No    Fall or balance concerns reported    No    Warm-up and Cool-down Performed on first and last piece of equipment    Resistance Training Performed Yes    VAD Patient? No    PAD/SET Patient? No      Pain Assessment   Currently in Pain? No/denies              Social History   Tobacco Use  Smoking Status Former Smoker  . Packs/day: 1.00  . Years: 15.00  . Pack years: 15.00  . Types: E-cigarettes, Cigarettes  . Quit date: 09/06/1998  . Years since quitting: 21.8  Smokeless Tobacco Never Used  Tobacco Comment   stopped smoking vapes 2 months ago    Goals Met:  Independence with exercise equipment Exercise tolerated well No report of cardiac concerns or symptoms  Goals Unmet:  Not Applicable  Comments: Pt able to follow exercise prescription today without complaint.  Will continue to monitor for progression.    Dr. Mark Miller is Medical Director for [facility] and [facility]. 

## 2020-06-21 ENCOUNTER — Ambulatory Visit (INDEPENDENT_AMBULATORY_CARE_PROVIDER_SITE_OTHER): Payer: 59 | Admitting: Cardiothoracic Surgery

## 2020-06-21 ENCOUNTER — Other Ambulatory Visit: Payer: Self-pay

## 2020-06-21 ENCOUNTER — Encounter: Payer: Self-pay | Admitting: Cardiothoracic Surgery

## 2020-06-21 VITALS — BP 145/75 | HR 66 | Temp 97.8°F | Resp 20 | Ht 66.0 in | Wt 193.0 lb

## 2020-06-21 DIAGNOSIS — I2510 Atherosclerotic heart disease of native coronary artery without angina pectoris: Secondary | ICD-10-CM

## 2020-06-21 DIAGNOSIS — I350 Nonrheumatic aortic (valve) stenosis: Secondary | ICD-10-CM

## 2020-06-21 DIAGNOSIS — Z8709 Personal history of other diseases of the respiratory system: Secondary | ICD-10-CM | POA: Diagnosis not present

## 2020-06-21 DIAGNOSIS — Z951 Presence of aortocoronary bypass graft: Secondary | ICD-10-CM | POA: Diagnosis not present

## 2020-06-21 MED ORDER — AMOXICILLIN 500 MG PO TABS: 500.0000 mg | ORAL_TABLET | Freq: Once | ORAL | 4 refills | Status: AC

## 2020-06-21 NOTE — Addendum Note (Signed)
Addended by: Floyde Parkins MD, Vinay Ertl on: 06/21/2020 10:58 AM   Modules accepted: Orders

## 2020-06-21 NOTE — Progress Notes (Signed)
PCP is [place] Referring Provider is Toni Arthurs, NP  Chief Complaint  Patient presents with  . Routine Post Op    6 week f/u HX of AVR/CABG    HPI: Patient returns for his final postop visit 3 and half months after CABG-AVR.  He returns for this visit with chest x-ray to evaluate a persistent left pleural effusion.  Patient denies any shortness of breath.  Patient denies any symptoms of recurrent angina.  Personally reviewed the chest x-ray performed today which shows clear lung fields and complete resolution of the left pleural effusion.  He understands he does not need to take the Lasix every day but should take a single dose if he develops ankle swelling or 2 pound weight gain from 1 morning to the next.   Past Medical History:  Diagnosis Date  . Coronary artery disease   . High cholesterol   . Hypertension   . Myocardial infarct, old   . Plantar fasciitis     Past Surgical History:  Procedure Laterality Date  . AORTIC VALVE REPLACEMENT N/A 03/03/2020   Procedure: AORTIC VALVE REPLACEMENT (AVR) Inspiris 60m;  Surgeon: VIvin Poot MD;  [place]  Service: Open Heart Surgery;  Laterality: N/A;  . CARDIAC SURGERY    . CORONARY ANGIOPLASTY WITH STENT PLACEMENT    . CORONARY ARTERY BYPASS GRAFT N/A 03/03/2020   Procedure: CORONARY ARTERY BYPASS GRAFTING (CABG) x3. Endoscopic saphenous vein harvest  ;  Surgeon: VIvin Poot MD;  [place]  Service: Open Heart Surgery;  Laterality: N/A;  . FLEXIBLE BRONCHOSCOPY N/A 03/03/2020   Procedure: FLEXIBLE BRONCHOSCOPY;  Surgeon: VIvin Poot MD;  [place]  Service: Open Heart Surgery;  Laterality: N/A;  . IR THORACENTESIS ASP PLEURAL SPACE W/IMG GUIDE  04/07/2020  . RIGHT/LEFT HEART CATH AND CORONARY ANGIOGRAPHY N/A 12/31/2017   Procedure: RIGHT/LEFT HEART CATH AND CORONARY ANGIOGRAPHY;  Surgeon: PIsaias Cowman MD;  [place] [facility];  Service: Cardiovascular;  Laterality: N/A;   . RIGHT/LEFT HEART CATH AND CORONARY ANGIOGRAPHY N/A 02/15/2020   Procedure: RIGHT/LEFT HEART CATH AND CORONARY ANGIOGRAPHY;  Surgeon: PIsaias Cowman MD;  [place] [facility];  Service: Cardiovascular;  Laterality: N/A;  . TEE WITHOUT CARDIOVERSION N/A 03/03/2020   Procedure: TRANSESOPHAGEAL ECHOCARDIOGRAM (TEE);  Surgeon: VPrescott Gum PCollier Salina MD;  [place]  Service: Open Heart Surgery;  Laterality: N/A;    Family History  Problem Relation Age of Onset  . Gout Brother     Social History Social History   Tobacco Use  . Smoking status: Former Smoker    Packs/day: 1.00    Years: 15.00    Pack years: 15.00    Types: E-cigarettes, Cigarettes    Quit date: 09/06/1998    Years since quitting: 21.8  . Smokeless tobacco: Never Used  . Tobacco comment: stopped smoking vapes 2 months ago  Vaping Use  . Vaping Use: Former  Substance Use Topics  . Alcohol use: Yes    Alcohol/week: 10.0 standard drinks    Types: 10 Cans of beer per week  . Drug use: Never    Current Outpatient Medications  Medication Sig Dispense Refill  . acetaminophen (TYLENOL) 325 MG tablet Take 2 tablets (650 mg total) by mouth every 6 (six) hours as needed for mild pain or fever.    .Marland KitchenamLODipine (NORVASC) 10 MG tablet Take 1 tablet (10 mg total) by mouth daily. 30 tablet 2  . aspirin EC 325  MG EC tablet Take 1 tablet (325 mg total) by mouth daily. 30 tablet 0  . atorvastatin (LIPITOR) 10 MG tablet Take 1 tablet (10 mg total) by mouth daily. 30 tablet 1  . carvedilol (COREG) 3.125 MG tablet Take 1 tablet (3.125 mg total) by mouth 2 (two) times daily with a meal. 60 tablet 1  . Cholecalciferol 50 MCG (2000 UT) CAPS Take 2,000 Units by mouth daily.     . colchicine 0.6 MG tablet Take 1 tablet (0.6 mg total) by mouth 2 (two) times daily. 60 tablet 0  . ferrous sulfate 325 (65 FE) MG tablet Take 1 tablet (325 mg total) by mouth daily with breakfast. For one month then stop. If develops constipation,  may stop sooner 30 tablet 3  . lisinopril (ZESTRIL) 40 MG tablet Take 40 mg by mouth daily.    . Omega-3 Fatty Acids (FISH OIL) 1200 MG CAPS Take 1,200 mg by mouth 2 (two) times a week.     Marland Kitchen omeprazole (PRILOSEC) 20 MG capsule Take 20-40 mg by mouth daily as needed.     . sildenafil (REVATIO) 20 MG tablet Take 60-80 mg by mouth daily as needed (erectile dysfuntion.).     Marland Kitchen traMADol (ULTRAM) 50 MG tablet Take 1 tablet (50 mg total) by mouth every 6 (six) hours as needed. 40 tablet 0  . metFORMIN (GLUCOPHAGE) 500 MG tablet Take 500 mg by mouth daily.      No current facility-administered medications for this visit.    No Known Allergies  Review of Systems   Improved strength Surgical incisions are all healed No ankle edema or shortness of breath  BP (!) 145/75   Pulse 66   Temp 97.8 F (36.6 C) (Skin)   Resp 20   Ht _0  (1.676 m)   Wt 193 lb (87.5 kg)   SpO2 98% Comment: RA  BMI 31.15 kg/m  Physical Exam      Exam    General- alert and comfortable.  Sternal incision leg incisions healing well.    Neck- no JVD, no cervical adenopathy palpable, no carotid bruit   Lungs- clear without rales, wheezes   Cor- regular rate and rhythm, no murmur , gallop   Abdomen- soft, non-tender   Extremities - warm, non-tender, minimal edema   Neuro- oriented, appropriate, no focal weakness   Diagnostic Tests: Chest x-ray today personally reviewed without evidence of recurrent left pleural effusion  Impression: Stable and full recovery after AVR-CABG. Patient understands the importance of dental hygiene and antibiotic prophylaxis prior to any dental procedures.  I will call him in some amoxicillin to be taken 1 hour before dental procedures in the future.  Patient is advised not to return to his preoperative job with heavy lifting at the [employer] as this would be very high risk for cardiac complications of his heart surgery, valve replacement and would aggravate his high blood  pressure.  The patient can now lift him is of normal daily living to do yard work etc. but he should not resume any strenuous work or heavy lifting due to his cardiac condition and recent cardiac surgery  Plan: Patient will be followed by his cardiologist primary care and return here as needed.  Len Childs, MD [facility] [phone]

## 2020-06-22 DIAGNOSIS — Z952 Presence of prosthetic heart valve: Secondary | ICD-10-CM

## 2020-06-22 DIAGNOSIS — Z951 Presence of aortocoronary bypass graft: Secondary | ICD-10-CM

## 2020-06-22 NOTE — Progress Notes (Signed)
Daily Session Note  Patient Details  Name: Curtis Maxwell MRN: [id number] Date of Birth: [birth year] Referring Provider:     Cardiac Rehab from 04/10/2020 in [facility] Re[facility]  Referring Provider Isaias Cowman MD      Encounter Date: 06/22/2020  Check In:  Session Check In - 06/22/20 1007      Check-In   Supervising physician immediately available to respond to emergencies See telemetry face sheet for immediately available ER MD    Location [facility]-Cardiac & Pulmonary Rehab    Staff Present Hope Budds RDN, Luther Redo, MPA, RN;Amanda Sommer, BA, ACSM CEP, Exercise Physiologist    Virtual Visit No    Medication changes reported     No    Fall or balance concerns reported    No    Warm-up and Cool-down Performed on first and last piece of equipment    Resistance Training Performed Yes    VAD Patient? No    PAD/SET Patient? No      Pain Assessment   Currently in Pain? No/denies              Social History   Tobacco Use  Smoking Status Former Smoker  . Packs/day: 1.00  . Years: 15.00  . Pack years: 15.00  . Types: E-cigarettes, Cigarettes  . Quit date: 09/06/1998  . Years since quitting: 21.8  Smokeless Tobacco Never Used  Tobacco Comment   stopped smoking vapes 2 months ago    Goals Met:  Independence with exercise equipment Exercise tolerated well No report of cardiac concerns or symptoms Strength training completed today  Goals Unmet:  Not Applicable  Comments: Pt able to follow exercise prescription today without complaint.  Will continue to monitor for progression.   Dr. Emily Filbert is Medical Director for [place] and [facility].

## 2020-06-27 ENCOUNTER — Encounter: Payer: 59 | Admitting: *Deleted

## 2020-06-27 ENCOUNTER — Other Ambulatory Visit: Payer: Self-pay

## 2020-06-27 DIAGNOSIS — Z952 Presence of prosthetic heart valve: Secondary | ICD-10-CM | POA: Diagnosis not present

## 2020-06-27 DIAGNOSIS — Z951 Presence of aortocoronary bypass graft: Secondary | ICD-10-CM

## 2020-06-27 NOTE — Progress Notes (Signed)
Daily Session Note  Patient Details  Name: Curtis Maxwell MRN: [id number] Date of Birth: 11-Apr-1961 Referring Provider:     Cardiac Rehab from 04/10/2020 in [facility] Cardiac and Pulmonary Rehab  Referring Provider Isaias Cowman MD      Encounter Date: 06/27/2020  Check In:  Session Check In - 06/27/20 0918      Check-In   Supervising physician immediately available to respond to emergencies See telemetry face sheet for immediately available ER MD    Location [facility]-Cardiac & Pulmonary Rehab    Staff Present Hope Budds RDN, LDN;Kaneisha Ellenberger Sherryll Burger, RN Margurite Auerbach, MS Exercise Physiologist    Virtual Visit No    Medication changes reported     No    Fall or balance concerns reported    No    Warm-up and Cool-down Performed on first and last piece of equipment    Resistance Training Performed Yes    VAD Patient? No    PAD/SET Patient? No      Pain Assessment   Currently in Pain? No/denies              Social History   Tobacco Use  Smoking Status Former Smoker  . Packs/day: 1.00  . Years: 15.00  . Pack years: 15.00  . Types: E-cigarettes, Cigarettes  . Quit date: 09/06/1998  . Years since quitting: 21.8  Smokeless Tobacco Never Used  Tobacco Comment   stopped smoking vapes 2 months ago    Goals Met:  Independence with exercise equipment Exercise tolerated well No report of cardiac concerns or symptoms Strength training completed today  Goals Unmet:  Not Applicable  Comments: Pt able to follow exercise prescription today without complaint.  Will continue to monitor for progression.    Dr. Emily Filbert is Medical Director for [place] [facility].

## 2020-07-04 ENCOUNTER — Telehealth: Payer: Self-pay

## 2020-07-04 NOTE — Telephone Encounter (Signed)
Patient called, inquired about graduating early from [facility] early as he stated "he has a lot going on."  Agreed to come in and complete his 6MWT for discharge and plans to do that this upcoming Thursday, 11/4.

## 2020-07-05 ENCOUNTER — Encounter: Payer: Self-pay | Admitting: *Deleted

## 2020-07-05 DIAGNOSIS — Z951 Presence of aortocoronary bypass graft: Secondary | ICD-10-CM

## 2020-07-05 DIAGNOSIS — Z952 Presence of prosthetic heart valve: Secondary | ICD-10-CM

## 2020-07-05 NOTE — Progress Notes (Signed)
Cardiac Individual Treatment Plan  Patient Details  Name: Curtis Maxwell MRN: [id number] Date of Birth: Dec 14, 1960 Referring Provider:     Cardiac Rehab from 04/10/2020 in [facility] Cardiac and Pulmonary Rehab  Referring Provider Isaias Cowman MD      Initial Encounter Date:    Cardiac Rehab from 04/10/2020 in [facility] Cardiac and Pulmonary Rehab  Date 04/10/20      Visit Diagnosis: S/P CABG x 3  S/P AVR (aortic valve replacement)  Patient's Home Medications on Admission:  Current Outpatient Medications:  .  acetaminophen (TYLENOL) 325 MG tablet, Take 2 tablets (650 mg total) by mouth every 6 (six) hours as needed for mild pain or fever., Disp: , Rfl:  .  amLODipine (NORVASC) 10 MG tablet, Take 1 tablet (10 mg total) by mouth daily., Disp: 30 tablet, Rfl: 2 .  aspirin EC 325 MG EC tablet, Take 1 tablet (325 mg total) by mouth daily., Disp: 30 tablet, Rfl: 0 .  atorvastatin (LIPITOR) 10 MG tablet, Take 1 tablet (10 mg total) by mouth daily., Disp: 30 tablet, Rfl: 1 .  carvedilol (COREG) 3.125 MG tablet, Take 1 tablet (3.125 mg total) by mouth 2 (two) times daily with a meal., Disp: 60 tablet, Rfl: 1 .  Cholecalciferol 50 MCG (2000 UT) CAPS, Take 2,000 Units by mouth daily. , Disp: , Rfl:  .  colchicine 0.6 MG tablet, Take 1 tablet (0.6 mg total) by mouth 2 (two) times daily., Disp: 60 tablet, Rfl: 0 .  ferrous sulfate 325 (65 FE) MG tablet, Take 1 tablet (325 mg total) by mouth daily with breakfast. For one month then stop. If develops constipation, may stop sooner, Disp: 30 tablet, Rfl: 3 .  lisinopril (ZESTRIL) 40 MG tablet, Take 40 mg by mouth daily., Disp: , Rfl:  .  metFORMIN (GLUCOPHAGE) 500 MG tablet, Take 500 mg by mouth daily. , Disp: , Rfl:  .  Omega-3 Fatty Acids (FISH OIL) 1200 MG CAPS, Take 1,200 mg by mouth 2 (two) times a week. , Disp: , Rfl:  .  omeprazole (PRILOSEC) 20 MG capsule, Take 20-40 mg by mouth daily as needed. , Disp: , Rfl:  .  sildenafil (REVATIO) 20 MG  tablet, Take 60-80 mg by mouth daily as needed (erectile dysfuntion.). , Disp: , Rfl:  .  traMADol (ULTRAM) 50 MG tablet, Take 1 tablet (50 mg total) by mouth every 6 (six) hours as needed., Disp: 40 tablet, Rfl: 0  Past Medical History: Past Medical History:  Diagnosis Date  . Coronary artery disease   . High cholesterol   . Hypertension   . Myocardial infarct, old   . Plantar fasciitis     Tobacco Use: Social History   Tobacco Use  Smoking Status Former Smoker  . Packs/day: 1.00  . Years: 15.00  . Pack years: 15.00  . Types: E-cigarettes, Cigarettes  . Quit date: 09/06/1998  . Years since quitting: 21.8  Smokeless Tobacco Never Used  Tobacco Comment   stopped smoking vapes 2 months ago    Labs: Recent Review Flowsheet Data    Labs for ITP Cardiac and Pulmonary Rehab Latest Ref Rng & Units 03/06/2020 03/07/2020 03/08/2020 03/09/2020 03/10/2020   Hemoglobin A1c 4.8 - 5.6 % - - - - -   PHART 7.35 - 7.45 - - - - -   PCO2ART 32 - 48 mmHg - - - - -   HCO3 20.0 - 28.0 mmol/L - - - - -   TCO2 22 - 32 mmol/L - - - - -  ACIDBASEDEF 0.0 - 2.0 mmol/L - - - - -   O2SAT % 58.3 58.8 65.6 49.9 54.7       Exercise Target Goals: Exercise Program Goal: Individual exercise prescription set using results from initial 6 min walk test and THRR while considering  patient's activity barriers and safety.   Exercise Prescription Goal: Initial exercise prescription builds to 30-45 minutes a day of aerobic activity, 2-3 days per week.  Home exercise guidelines will be given to patient during program as part of exercise prescription that the participant will acknowledge.   Education: Aerobic Exercise & Resistance Training: - Gives group verbal and written instruction on the various components of exercise. Focuses on aerobic and resistive training programs and the benefits of this training and how to safely progress through these programs..   Cardiac Rehab from 06/22/2020 in [facility] Cardiac and Pulmonary  Rehab  Date 06/22/20  Educator [facility]  Instruction Review Code 1- Verbalizes Understanding  [also taken on 04/20/20]      Education: Exercise & Equipment Safety: - Individual verbal instruction and demonstration of equipment use and safety with use of the equipment.   Cardiac Rehab from 06/22/2020 in [facility] Cardiac and Pulmonary Rehab  Date 04/06/20  Educator [facility]  Instruction Review Code 1- Verbalizes Understanding      Education: Exercise Physiology & General Exercise Guidelines: - Group verbal and written instruction with models to review the exercise physiology of the cardiovascular system and associated critical values. Provides general exercise guidelines with specific guidelines to those with heart or lung disease.    Cardiac Rehab from 06/22/2020 in [facility] Cardiac and Pulmonary Rehab  Education need identified 04/10/20  Date 06/15/20  Educator [facility]  Instruction Review Code 1- Verbalizes Understanding  [need identified]      Education: Flexibility, Balance, Mind/Body Relaxation: Provides group verbal/written instruction on the benefits of flexibility and balance training, including mind/body exercise modes such as yoga, pilates and tai chi.  Demonstration and skill practice provided.   Cardiac Rehab from 06/22/2020 in [facility] Cardiac and Pulmonary Rehab  Date 05/04/20  Educator [facility]  Instruction Review Code 1- Verbalizes Understanding      Activity Barriers & Risk Stratification:  Activity Barriers & Cardiac Risk Stratification - 04/10/20 1147      Activity Barriers & Cardiac Risk Stratification   Activity Barriers Incisional Pain;Shortness of Breath;Deconditioning;Joint Problems   shoulders sore from surgery   Cardiac Risk Stratification High           6 Minute Walk:  6 Minute Walk    Row Name 04/10/20 1146         6 Minute Walk   Phase Initial     Distance 1059 feet     Walk Time 6 minutes     # of Rest Breaks 0     MPH 2     METS 3.3     RPE 13     Perceived  Dyspnea  3     VO2 Peak 11.56     Symptoms Yes (comment)     Comments chest burning 8/10, hard to breathe/SOB     Resting HR 73 bpm     Resting BP 122/66     Resting Oxygen Saturation  99 %     Exercise Oxygen Saturation  during 6 min walk 99 %     Max Ex. HR 107 bpm     Max Ex. BP 174/84     2 Minute Post BP 142/70  Oxygen Initial Assessment:   Oxygen Re-Evaluation:   Oxygen Discharge (Final Oxygen Re-Evaluation):   Initial Exercise Prescription:  Initial Exercise Prescription - 04/10/20 1100      Date of Initial Exercise RX and Referring Provider   Date 04/10/20    Referring Provider Paraschos, Alexander MD      Treadmill   MPH 2    Grade 1    Minutes 15    METs 2.81      Recumbant Elliptical   Level 1    RPM 50    Minutes 15    METs 2.8      REL-XR   Level 2    Speed 50    Minutes 15    METs 3      Prescription Details   Frequency (times per week) 2    Duration Progress to 30 minutes of continuous aerobic without signs/symptoms of physical distress      Intensity   THRR 40-80% of Max Heartrate 109-144    Ratings of Perceived Exertion 11-13    Perceived Dyspnea 0-4      Progression   Progression Continue to progress workloads to maintain intensity without signs/symptoms of physical distress.      Resistance Training   Training Prescription Yes    Weight 3 lb    Reps 10-15           Perform Capillary Blood Glucose checks as needed.  Exercise Prescription Changes:  Exercise Prescription Changes    Row Name 04/10/20 1100 04/12/20 1400 04/26/20 1400 05/11/20 1100 06/22/20 1100     Response to Exercise   Blood Pressure (Admit) 122/66 110/62 140/74 118/64 108/56   Blood Pressure (Exercise) 174/84 142/84 132/82 152/70 148/66   Blood Pressure (Exit) 142/70 128/64 136/70 126/60 92/50   Heart Rate (Admit) 73 bpm 81 bpm 82 bpm 76 bpm 79 bpm   Heart Rate (Exercise) 107 bpm 106 bpm 109 bpm 106 bpm 104 bpm   Heart Rate (Exit) 73 bpm 83  bpm 76 bpm 61 bpm 83 bpm   Oxygen Saturation (Admit) 99 % -- -- -- --   Oxygen Saturation (Exercise) 99 % -- -- -- --   Rating of Perceived Exertion (Exercise) _0 Perceived Dyspnea (Exercise) 3 -- -- -- --   Symptoms chest buring (8/10), SOB/hard to breathe chest burning chest burning none none   Comments walk test results first full day of exercise -- -- --   Duration -- Progress to 30 minutes of  aerobic without signs/symptoms of physical distress Progress to 30 minutes of  aerobic without signs/symptoms of physical distress Continue with 30 min of aerobic exercise without signs/symptoms of physical distress. Continue with 30 min of aerobic exercise without signs/symptoms of physical distress.   Intensity -- THRR unchanged THRR unchanged THRR unchanged THRR unchanged     Progression   Progression -- Continue to progress workloads to maintain intensity without signs/symptoms of physical distress. Continue to progress workloads to maintain intensity without signs/symptoms of physical distress. Continue to progress workloads to maintain intensity without signs/symptoms of physical distress. Continue to progress workloads to maintain intensity without signs/symptoms of physical distress.   Average METs -- 2.81 2.15 1.87 2.2     Resistance Training   Training Prescription -- Yes Yes Yes Yes   Weight -- ROM ROM 3 lb 5 lb   Reps -- 10-15 10-15 10-15 10-15     Interval Training   Interval Training -- No  No No --     Treadmill   MPH -- 2 2 1.5 2   Grade -- 1 1 0 1   Minutes -- _0 METs -- 2.81 2.81 2.15 2.15     Recumbant Elliptical   Level -- _1 --   RPM -- -- 50 -- --   Minutes -- _2 --   METs -- -- 1.5 1.6 --     REL-XR   Level -- -- -- -- 2   Speed -- -- -- -- 50   Minutes -- -- -- -- 15   METs -- -- -- -- 1.5     Home Exercise Plan   Plans to continue exercise at -- -- -- Home (comment) --   Frequency -- -- -- Add 2 additional days to program  exercise sessions. --   Initial Home Exercises Provided -- -- -- 05/11/20 --          Exercise Comments:  Exercise Comments    Row Name 04/11/20 1039 04/11/20 1044         Exercise Comments First full day of exercise!  Patient was oriented to gym and equipment including functions, settings, policies, and procedures.  Patient's individual exercise prescription and treatment plan were reviewed.  All starting workloads were established based on the results of the 6 minute walk test done at initial orientation visit.  The plan for exercise progression was also introduced and progression will be customized based on patient's performance and goals. Today Ethel complained of chest discomfort/pain. Center of chest and all down sternum area. He is 4 weeks post surgery. Incision looks healed. Jalani states when asked that his sternum is not moving. The discomfort/pain occurred when exercising. He stated that he has been walking at home and does not have this happen then. Advised him to do seated machine exercise and not use his arms.  Only ROM for resistive, no weights.That did help decrease the pain. He sees his surgeon tomorrow and will discuss this discomfort/pain during his visit.             Exercise Goals and Review:  Exercise Goals    Row Name 04/10/20 1149             Exercise Goals   Increase Physical Activity Yes       Intervention Develop an individualized exercise prescription for aerobic and resistive training based on initial evaluation findings, risk stratification, comorbidities and participant's personal goals.;Provide advice, education, support and counseling about physical activity/exercise needs.       Expected Outcomes Short Term: Attend rehab on a regular basis to increase amount of physical activity.;Long Term: Add in home exercise to make exercise part of routine and to increase amount of physical activity.;Long Term: Exercising regularly at least 3-5 days a week.        Increase Strength and Stamina Yes       Intervention Provide advice, education, support and counseling about physical activity/exercise needs.;Develop an individualized exercise prescription for aerobic and resistive training based on initial evaluation findings, risk stratification, comorbidities and participant's personal goals.       Expected Outcomes Short Term: Increase workloads from initial exercise prescription for resistance, speed, and METs.;Short Term: Perform resistance training exercises routinely during rehab and add in resistance training at home;Long Term: Improve cardiorespiratory fitness, muscular endurance and strength as measured by increased METs and functional capacity (6MWT)       Able to  understand and use rate of perceived exertion (RPE) scale Yes       Intervention Provide education and explanation on how to use RPE scale       Expected Outcomes Short Term: Able to use RPE daily in rehab to express subjective intensity level;Long Term:  Able to use RPE to guide intensity level when exercising independently       Able to understand and use Dyspnea scale Yes       Intervention Provide education and explanation on how to use Dyspnea scale       Expected Outcomes Long Term: Able to use Dyspnea scale to guide intensity level when exercising independently;Short Term: Able to use Dyspnea scale daily in rehab to express subjective sense of shortness of breath during exertion       Knowledge and understanding of Target Heart Rate Range (THRR) Yes       Intervention Provide education and explanation of THRR including how the numbers were predicted and where they are located for reference       Expected Outcomes Short Term: Able to state/look up THRR;Short Term: Able to use daily as guideline for intensity in rehab;Long Term: Able to use THRR to govern intensity when exercising independently       Able to check pulse independently Yes       Intervention Provide education and demonstration  on how to check pulse in carotid and radial arteries.;Review the importance of being able to check your own pulse for safety during independent exercise       Expected Outcomes Short Term: Able to explain why pulse checking is important during independent exercise;Long Term: Able to check pulse independently and accurately       Understanding of Exercise Prescription Yes       Intervention Provide education, explanation, and written materials on patient's individual exercise prescription       Expected Outcomes Short Term: Able to explain program exercise prescription;Long Term: Able to explain home exercise prescription to exercise independently              Exercise Goals Re-Evaluation :  Exercise Goals Re-Evaluation    Row Name 04/11/20 1039 04/26/20 1418 04/26/20 1420 05/11/20 1121 06/08/20 0805     Exercise Goal Re-Evaluation   Exercise Goals Review Understanding of Exercise Prescription;Knowledge and understanding of Target Heart Rate Range (THRR);Able to understand and use rate of perceived exertion (RPE) scale Understanding of Exercise Prescription;Knowledge and understanding of Target Heart Rate Range (THRR);Able to understand and use rate of perceived exertion (RPE) scale -- Increase Physical Activity;Increase Strength and Stamina;Knowledge and understanding of Target Heart Rate Range (THRR);Able to check pulse independently;Understanding of Exercise Prescription --   Comments Reviewed RPE and dyspnea scales, THR and program prescription with pt today.  Pt voiced understanding and was given a copy of goals to take home. Joe has had some chest burning with exercise. He ha sspoken with his Dr about this and they believe it is coming form his incision. Reviewed home exercise with pt today.  Pt plans to walk and use stationary bike for exercise.  Reviewed THR, pulse, RPE, sign and symptoms, pulse oximetery and when to call 911 or MD.  Also discussed weather considerations and indoor options.   Pt voiced understanding. Out since last review   Expected Outcomes Short: Use RPE daily to regulate intensity. Long: Follow program prescription in THR. -- Short: attend consistently Long: exercise without symptoms Short: practice checking HR during exercise Long:  monitor  vitals during exercise --   Row Name 06/22/20 1119             Exercise Goal Re-Evaluation   Exercise Goals Review Increase Physical Activity;Increase Strength and Stamina;Able to understand and use rate of perceived exertion (RPE) scale       Comments Joe met with Dr Prescott Gum yesterday.  He still has some chest tightness that could be realyed to CABG and has been advised to stop exercise if it starts and to follow up with cardiologist about this.  He is able to walk on TM.       Expected Outcomes Short: follow up with cardiologist Long: continue to improve stamina              Discharge Exercise Prescription (Final Exercise Prescription Changes):  Exercise Prescription Changes - 06/22/20 1100      Response to Exercise   Blood Pressure (Admit) 108/56    Blood Pressure (Exercise) 148/66    Blood Pressure (Exit) 92/50    Heart Rate (Admit) 79 bpm    Heart Rate (Exercise) 104 bpm    Heart Rate (Exit) 83 bpm    Rating of Perceived Exertion (Exercise) 13    Symptoms none    Duration Continue with 30 min of aerobic exercise without signs/symptoms of physical distress.    Intensity THRR unchanged      Progression   Progression Continue to progress workloads to maintain intensity without signs/symptoms of physical distress.    Average METs 2.2      Resistance Training   Training Prescription Yes    Weight 5 lb    Reps 10-15      Treadmill   MPH 2    Grade 1    Minutes 15    METs 2.15      REL-XR   Level 2    Speed 50    Minutes 15    METs 1.5           Nutrition:  Target Goals: Understanding of nutrition guidelines, daily intake of sodium <1570m, cholesterol <2068m calories 30% from fat and 7%  or less from saturated fats, daily to have 5 or more servings of fruits and vegetables.  Education: Controlling Sodium/Reading Food Labels -Group verbal and written material supporting the discussion of sodium use in heart healthy nutrition. Review and explanation with models, verbal and written materials for utilization of the food label.   Education: General Nutrition Guidelines/Fats and Fiber: -Group instruction provided by verbal, written material, models and posters to present the general guidelines for heart healthy nutrition. Gives an explanation and review of dietary fats and fiber.   [facility]  Date 05/11/20  [facility] Code 1- Verbalizes Understanding  [need identified]      Biometrics:  Pre Biometrics - 04/10/20 1150      Pre Biometrics   Height 5' 5.8" (1.671 m)    Weight 193 lb 9 oz (87.8 kg)    BMI (Calculated) 31.44    Single Leg Stand 30 seconds            Nutrition Therapy Plan and Nutrition Goals:  Nutrition Therapy & Goals - 04/18/20 1018      Nutrition Therapy   Diet Heart healthy, low Na    Drug/Food Interactions Statins/Certain Fruits    Protein (specify units) 70g    Fiber 30 grams    Whole Grain Foods 3 servings  Saturated Fats 12 max. grams    Fruits and Vegetables 5 servings/day    Sodium 1.5 grams      Personal Nutrition Goals   Nutrition Goal [id number] without breathing pain and chest pain.    Comments Pt primary cardiac rehab diagnosis is s/p CABG x3. Pt also presents with CAD, high cholesterol, HTN, GERD,  vitamin D deficiancy and low B12 -2020, T2DM (A1C 6.1). Alcohol 10/week - beer. Current relevant medications: lipitor, lasix (short term), metformin, iron, vitamin D. No longer deficient in vitamin D and B12.  B: pancakes with eggs and [place] sausage L: eggs on english muffin or polish [place] sausage and hotdog bun D: makes pizza yesterday. discussed heart  healthy eating, pt would not like to make any changes at this time.      Intervention Plan   Intervention Prescribe, educate and counsel regarding individualized specific dietary modifications aiming towards targeted core components such as weight, hypertension, lipid management, diabetes, heart failure and other comorbidities.;Nutrition handout(s) given to patient.    Expected Outcomes Short Term Goal: Understand basic principles of dietary content, such as calories, fat, sodium, cholesterol and nutrients.;Short Term Goal: A plan has been developed with personal nutrition goals set during dietitian appointment.;Long Term Goal: Adherence to prescribed nutrition plan.           Nutrition Assessments:  Nutrition Assessments - 04/10/20 1151      MEDFICTS Scores   Pre Score 47           MEDIFICTS Score Key:          ?70 Need to make dietary changes          40-70 Heart Healthy Diet         ? 40 Therapeutic Level Cholesterol Diet  Nutrition Goals Re-Evaluation:   Nutrition Goals Discharge (Final Nutrition Goals Re-Evaluation):   Psychosocial: Target Goals: Acknowledge presence or absence of significant depression and/or stress, maximize coping skills, provide positive support system. Participant is able to verbalize types and ability to use techniques and skills needed for reducing stress and depression.   Education: Depression - Provides group verbal and written instruction on the correlation between heart/lung disease and depressed mood, treatment options, and the stigmas associated with seeking treatment.   Cardiac Rehab from 06/22/2020 in [facility] Cardiac and Pulmonary Rehab  Date 06/08/20  Educator [facility]  Instruction Review Code 1- Verbalizes Understanding      Education: Sleep Hygiene -Provides group verbal and written instruction about how sleep can affect your health.  Define sleep hygiene, discuss sleep cycles and impact of sleep habits. Review good sleep hygiene tips.      Education: Stress and Anxiety: - Provides group verbal and written instruction about the health risks of elevated stress and causes of high stress.  Discuss the correlation between heart/lung disease and anxiety and treatment options. Review healthy ways to manage with stress and anxiety.   Cardiac Rehab from 06/22/2020 in [facility] Cardiac and Pulmonary Rehab  Date 06/08/20  Educator [facility]  Instruction Review Code 1- Verbalizes Understanding       Initial Review & Psychosocial Screening:  Initial Psych Review & Screening - 04/06/20 1042      Initial Review   Current issues with None Identified      Family Dynamics   Good Support System? Yes    Comments He can look to his wife and church family for support. He states that he does not need a shrink but exercsie.  Barriers   Psychosocial barriers to participate in program The patient should benefit from training in stress management and relaxation.;There are no identifiable barriers or psychosocial needs.      Screening Interventions   Interventions Encouraged to exercise;To provide support and resources with identified psychosocial needs;Provide feedback about the scores to participant    Expected Outcomes Short Term goal: Utilizing psychosocial counselor, staff and physician to assist with identification of specific Stressors or current issues interfering with healing process. Setting desired goal for each stressor or current issue identified.;Long Term Goal: Stressors or current issues are controlled or eliminated.;Short Term goal: Identification and review with participant of any Quality of Life or Depression concerns found by scoring the questionnaire.;Long Term goal: The participant improves quality of Life and PHQ9 Scores as seen by post scores and/or verbalization of changes           Quality of Life Scores:   Quality of Life - 04/10/20 1150      Quality of Life   Select Quality of Life      Quality of Life Scores    Health/Function Pre 17.57 %    Socioeconomic Pre 23.5 %    Psych/Spiritual Pre 21.43 %    Family Pre 24 %    GLOBAL Pre 20.44 %          Scores of 19 and below usually indicate a poorer quality of life in these areas.  A difference of  2-3 points is a clinically meaningful difference.  A difference of 2-3 points in the total score of the Quality of Life Index has been associated with significant improvement in overall quality of life, self-image, physical symptoms, and general health in studies assessing change in quality of life.  PHQ-9: Recent Review Flowsheet Data    Depression screen [facility] 2/9 06/08/2020 04/10/2020   Decreased Interest 2 0   Down, Depressed, Hopeless 1 1   PHQ - 2 Score 3 1   Altered sleeping 3 2   Tired, decreased energy 2 1   Change in appetite 0 0   Feeling bad or failure about yourself  0 0   Trouble concentrating 3 0   Moving slowly or fidgety/restless 0 1   Suicidal thoughts 0 0   PHQ-9 Score 11 5   Difficult doing work/chores Somewhat difficult Not difficult at all     Interpretation of Total Score  Total Score Depression Severity:  1-4 = Minimal depression, 5-9 = Mild depression, 10-14 = Moderate depression, 15-19 = Moderately severe depression, 20-27 = Severe depression   Psychosocial Evaluation and Intervention:  Psychosocial Evaluation - 04/06/20 1046      Psychosocial Evaluation & Interventions   Interventions Therapist referral;Encouraged to exercise with the program and follow exercise prescription;Relaxation education;Stress management education    Comments He can look to his wife and church family for support. He states that he does not need a shrink but exercsie.    Expected Outcomes Short: Exercise regularly to support mental health and notify staff of any changes. Long: maintain mental health and well being through teaching of rehab or prescribed medications independently.    Continue Psychosocial Services  Follow up required by staff            Psychosocial Re-Evaluation:   Psychosocial Discharge (Final Psychosocial Re-Evaluation):   Vocational Rehabilitation: Provide vocational rehab assistance to qualifying candidates.   Vocational Rehab Evaluation & Intervention:   Education: Education Goals: Education classes will be provided on a variety of topics  geared toward better understanding of heart health and risk factor modification. Participant will state understanding/return demonstration of topics presented as noted by education test scores.  Learning Barriers/Preferences:  Learning Barriers/Preferences - 04/06/20 1041      Learning Barriers/Preferences   Learning Barriers None    Learning Preferences None           General Cardiac Education Topics:  AED/CPR: - Group verbal and written instruction with the use of models to demonstrate the basic use of the AED with the basic ABC's of resuscitation.   Anatomy & Physiology of the Heart: - Group verbal and written instruction and models provide basic cardiac anatomy and physiology, with the coronary electrical and arterial systems. Review of Valvular disease and Heart Failure   Cardiac Rehab from 06/22/2020 in [facility] Cardiac and Pulmonary Rehab  Date 04/27/20  Educator SB  Instruction Review Code 1- Verbalizes Understanding  [need identified]      Cardiac Procedures: - Group verbal and written instruction to review commonly prescribed medications for heart disease. Reviews the medication, class of the drug, and side effects. Includes the steps to properly store meds and maintain the prescription regimen. (beta blockers and nitrates)   Cardiac Rehab from 06/22/2020 in [facility] Cardiac and Pulmonary Rehab  Date 04/27/20  Educator SB  Instruction Review Code 1- Verbalizes Understanding      Cardiac Medications I: - Group verbal and written instruction to review commonly prescribed medications for heart disease. Reviews the medication, class of the drug, and  side effects. Includes the steps to properly store meds and maintain the prescription regimen.   Cardiac Medications II: -Group verbal and written instruction to review commonly prescribed medications for heart disease. Reviews the medication, class of the drug, and side effects. (all other drug classes)    Go Sex-Intimacy & Heart Disease, Get SMART - Goal Setting: - Group verbal and written instruction through game format to discuss heart disease and the return to sexual intimacy. Provides group verbal and written material to discuss and apply goal setting through the application of the S.M.A.R.T. Method.   Cardiac Rehab from 06/22/2020 in [facility] Cardiac and Pulmonary Rehab  Date 04/27/20  Educator SB  Instruction Review Code 1- Verbalizes Understanding      Other Matters of the Heart: - Provides group verbal, written materials and models to describe Stable Angina and Peripheral Artery. Includes description of the disease process and treatment options available to the cardiac patient.   Infection Prevention: - Provides verbal and written material to individual with discussion of infection control including proper hand washing and proper equipment cleaning during exercise session.   Cardiac Rehab from 06/22/2020 in [facility] Cardiac and Pulmonary Rehab  Date 04/06/20  Educator [facility]  Instruction Review Code 1- Verbalizes Understanding      Falls Prevention: - Provides verbal and written material to individual with discussion of falls prevention and safety.   Cardiac Rehab from 06/22/2020 in [facility] Cardiac and Pulmonary Rehab  Date 04/06/20  Educator [facility]  Instruction Review Code 1- Verbalizes Understanding      Other: -Provides group and verbal instruction on various topics (see comments)   Knowledge Questionnaire Score:  Knowledge Questionnaire Score - 04/10/20 1151      Knowledge Questionnaire Score   Pre Score 21/26 Education Focus: Nutrition, Exercise, Angina, MI            Core Components/Risk Factors/Patient Goals at Admission:  Personal Goals and Risk Factors at Admission - 04/10/20 1152  Core Components/Risk Factors/Patient Goals on Admission    Weight Management Yes;Weight Loss;Obesity    Intervention Weight Management: Develop a combined nutrition and exercise program designed to reach desired caloric intake, while maintaining appropriate intake of nutrient and fiber, sodium and fats, and appropriate energy expenditure required for the weight goal.;Weight Management: Provide education and appropriate resources to help participant work on and attain dietary goals.;Weight Management/Obesity: Establish reasonable short term and long term weight goals.;Obesity: Provide education and appropriate resources to help participant work on and attain dietary goals.    Admit Weight 193 lb 9 oz (87.8 kg)    Goal Weight: Short Term 188 lb (85.3 kg)    Goal Weight: Long Term 180 lb (81.6 kg)    Expected Outcomes Short Term: Continue to assess and modify interventions until short term weight is achieved;Long Term: Adherence to nutrition and physical activity/exercise program aimed toward attainment of established weight goal;Weight Loss: Understanding of general recommendations for a balanced deficit meal plan, which promotes 1-2 lb weight loss per week and includes a negative energy balance of [id number] kcal/d;Understanding recommendations for meals to include 15-35% energy as protein, 25-35% energy from fat, 35-60% energy from carbohydrates, less than $RemoveB'200mg'tYgpOfLR$  of dietary cholesterol, 20-35 gm of total fiber daily;Understanding of distribution of calorie intake throughout the day with the consumption of 4-5 meals/snacks    Diabetes Yes    Intervention Provide education about signs/symptoms and action to take for hypo/hyperglycemia.;Provide education about proper nutrition, including hydration, and aerobic/resistive exercise prescription along with prescribed medications to  achieve blood glucose in normal ranges: Fasting glucose 65-99 mg/dL    Expected Outcomes Short Term: Participant verbalizes understanding of the signs/symptoms and immediate care of hyper/hypoglycemia, proper foot care and importance of medication, aerobic/resistive exercise and nutrition plan for blood glucose control.;Long Term: Attainment of HbA1C < 7%.    Hypertension Yes    Intervention Provide education on lifestyle modifcations including regular physical activity/exercise, weight management, moderate sodium restriction and increased consumption of fresh fruit, vegetables, and low fat dairy, alcohol moderation, and smoking cessation.;Monitor prescription use compliance.    Expected Outcomes Short Term: Continued assessment and intervention until BP is < 140/65mm HG in hypertensive participants. < 130/32mm HG in hypertensive participants with diabetes, heart failure or chronic kidney disease.;Long Term: Maintenance of blood pressure at goal levels.    Lipids Yes    Intervention Provide education and support for participant on nutrition & aerobic/resistive exercise along with prescribed medications to achieve LDL '70mg'$ , HDL >$Remo'40mg'XCApA$ .    Expected Outcomes Short Term: Participant states understanding of desired cholesterol values and is compliant with medications prescribed. Participant is following exercise prescription and nutrition guidelines.;Long Term: Cholesterol controlled with medications as prescribed, with individualized exercise RX and with personalized nutrition plan. Value goals: LDL < $Rem'70mg'lDly$ , HDL > 40 mg.           Education:Diabetes - Individual verbal and written instruction to review signs/symptoms of diabetes, desired ranges of glucose level fasting, after meals and with exercise. Acknowledge that pre and post exercise glucose checks will be done for 3 sessions at entry of program.   Cardiac Rehab from 06/22/2020 in [facility] Cardiac and Pulmonary Rehab  Date 04/06/20  Educator [facility]   Instruction Review Code 1- Verbalizes Understanding      Education: Know Your Numbers and Risk Factors: -Group verbal and written instruction about important numbers in your health.  Discussion of what are risk factors and how they play a role in the disease process.  Review  of Cholesterol, Blood Pressure, Diabetes, and BMI and the role they play in your overall health.   Core Components/Risk Factors/Patient Goals Review:    Core Components/Risk Factors/Patient Goals at Discharge (Final Review):    ITP Comments:  ITP Comments    Row Name 04/06/20 1047 04/10/20 1145 04/11/20 1038 04/11/20 1043 04/12/20 0600   ITP Comments Virtual Visit completed. Patient informed on EP and RD appointment and 6 Minute walk test. Patient also informed of patient health questionnaires on My Chart. Patient Verbalizes understanding. Visit diagnosis can be found in [facility] 03/03/2020. Completed 6MWT and gym orientation. Initial ITP created and sent for review to Dr. Emily Filbert, Medical Director. First full day of exercise!  Patient was oriented to gym and equipment including functions, settings, policies, and procedures.  Patient's individual exercise prescription and treatment plan were reviewed.  All starting workloads were established based on the results of the 6 minute walk test done at initial orientation visit.  The plan for exercise progression was also introduced and progression will be customized based on patient's performance and goals. Today Jonta complained of chest discomfort/pain. Center of chest and all down sternum area. He is 4 weeks post surgery. Incision looks healed. Remer states when asked that his sternum is not moving. The discomfort/pain occurred when exercising. He stated that he has been walking at home and does not have this happen then. Advised him to do seated machine exercise and not use his arms.  Only ROM for resistive, no weights.That did help decrease the pain. He sees his surgeon tomorrow  and will discuss this discomfort/pain during his visit. 30 Day review completed. Medical Director ITP review done, changes made as directed, and signed approval by Medical Director. New to program   Row Name 05/10/20 1501 06/07/20 0530 06/08/20 0805 07/04/20 0830 07/05/20 0622   ITP Comments 30 day review completed. ITP sent to Dr. Emily Filbert, Medical Director of Cardiac and Pulmonary Rehab. Continue with ITP unless changes are made by physician. 30 Day review completed. Medical Director ITP review done, changes made as directed, and signed approval by Medical Director. Out due to insurancse, got new card, should return today. Patient called, inquired about graduating early from [facility] early as he stated "he has a lot going on."  Agreed to come in and complete his 6MWT for discharge and plans to do that this upcoming Thursday, 11/4. 30 Day review completed. Medical Director ITP review done, changes made as directed, and signed approval by Medical Director.          Comments:

## 2020-07-06 ENCOUNTER — Encounter: Payer: 59 | Attending: Cardiology | Admitting: *Deleted

## 2020-07-06 ENCOUNTER — Other Ambulatory Visit: Payer: Self-pay

## 2020-07-06 VITALS — Ht 65.8 in | Wt 193.4 lb

## 2020-07-06 DIAGNOSIS — Z951 Presence of aortocoronary bypass graft: Secondary | ICD-10-CM | POA: Diagnosis not present

## 2020-07-06 NOTE — Progress Notes (Signed)
Discharge Progress Report  Patient Details  Name: Curtis Maxwell MRN: [id number] Date of Birth: Sep 24, 1960 Referring Provider:     Cardiac Rehab from 04/10/2020 in [facility] Cardiac and Pulmonary Rehab  Referring Provider Isaias Cowman MD       Number of Visits: 24  Reason for Discharge:  Patient reached a stable level of exercise. Patient independent in their exercise.  Smoking History:  Social History   Tobacco Use  Smoking Status Former Smoker  . Packs/day: 1.00  . Years: 15.00  . Pack years: 15.00  . Types: E-cigarettes, Cigarettes  . Quit date: 09/06/1998  . Years since quitting: 21.8  Smokeless Tobacco Never Used  Tobacco Comment   stopped smoking vapes 2 months ago    Diagnosis:  S/P CABG x 3  ADL UCSD:   Initial Exercise Prescription:  Initial Exercise Prescription - 04/10/20 1100      Date of Initial Exercise RX and Referring Provider   Date 04/10/20    Referring Provider Paraschos, Alexander MD      Treadmill   MPH 2    Grade 1    Minutes 15    METs 2.81      Recumbant Elliptical   Level 1    RPM 50    Minutes 15    METs 2.8      REL-XR   Level 2    Speed 50    Minutes 15    METs 3      Prescription Details   Frequency (times per week) 2    Duration Progress to 30 minutes of continuous aerobic without signs/symptoms of physical distress      Intensity   THRR 40-80% of Max Heartrate 109-144    Ratings of Perceived Exertion 11-13    Perceived Dyspnea 0-4      Progression   Progression Continue to progress workloads to maintain intensity without signs/symptoms of physical distress.      Resistance Training   Training Prescription Yes    Weight 3 lb    Reps 10-15           Discharge Exercise Prescription (Final Exercise Prescription Changes):  Exercise Prescription Changes - 06/22/20 1100      Response to Exercise   Blood Pressure (Admit) 108/56    Blood Pressure (Exercise) 148/66    Blood Pressure (Exit) 92/50    Heart  Rate (Admit) 79 bpm    Heart Rate (Exercise) 104 bpm    Heart Rate (Exit) 83 bpm    Rating of Perceived Exertion (Exercise) 13    Symptoms none    Duration Continue with 30 min of aerobic exercise without signs/symptoms of physical distress.    Intensity THRR unchanged      Progression   Progression Continue to progress workloads to maintain intensity without signs/symptoms of physical distress.    Average METs 2.2      Resistance Training   Training Prescription Yes    Weight 5 lb    Reps 10-15      Treadmill   MPH 2    Grade 1    Minutes 15    METs 2.15      REL-XR   Level 2    Speed 50    Minutes 15    METs 1.5           Functional Capacity:  6 Minute Walk    Row Name 04/10/20 1146 07/06/20 0935       6 Minute Walk  Phase Initial Discharge    Distance 1059 feet 1480 feet    Distance % Change -- 39.8 %    Distance Feet Change -- 421 ft    Walk Time 6 minutes 6 minutes    # of Rest Breaks 0 0    MPH 2 2.8    METS 3.3 4.42    RPE 13 15    Perceived Dyspnea  3 2    VO2 Peak 11.56 15.45    Symptoms Yes (comment) Yes (comment)    Comments chest burning 8/10, hard to breathe/SOB chest burning 7/10    Resting HR 73 bpm 63 bpm    Resting BP 122/66 120/64    Resting Oxygen Saturation  99 % --    Exercise Oxygen Saturation  during 6 min walk 99 % --    Max Ex. HR 107 bpm 129 bpm    Max Ex. BP 174/84 184/72    2 Minute Post BP 142/70 --           Psychological, QOL, Others - Outcomes: PHQ 2/9: Depression screen [facility] 2/9 07/06/2020 06/08/2020 04/10/2020  Decreased Interest 1 2 0  Down, Depressed, Hopeless 1 1 1   PHQ - 2 Score 2 3 1   Altered sleeping 3 3 2   Tired, decreased energy 0 2 1  Change in appetite 0 0 0  Feeling bad or failure about yourself  0 0 0  Trouble concentrating 1 3 0  Moving slowly or fidgety/restless 0 0 1  Suicidal thoughts 0 0 0  PHQ-9 Score 6 11 5   Difficult doing work/chores Extremely dIfficult Somewhat difficult Not difficult at  all    Quality of Life:  Quality of Life - 07/06/20 1026      Quality of Life   Select Quality of Life      Quality of Life Scores   Health/Function Pre 17.57 %    Health/Function Post 15.83 %    Health/Function % Change -9.9 %    Socioeconomic Pre 23.5 %    Socioeconomic Post 21.43 %    Socioeconomic % Change  -8.81 %    Psych/Spiritual Pre 21.43 %    Psych/Spiritual Post 21.29 %    Psych/Spiritual % Change -0.65 %    Family Pre 24 %    Family Post 20.1 %    Family % Change -16.25 %    GLOBAL Pre 20.44 %    GLOBAL Post 18.24 %    GLOBAL % Change -10.76 %           Nutrition & Weight - Outcomes:  Pre Biometrics - 04/10/20 1150      Pre Biometrics   Height 5' 5.8" (1.671 m)    Weight 193 lb 9 oz (87.8 kg)    BMI (Calculated) 31.44    Single Leg Stand 30 seconds           Post Biometrics - 07/06/20 0936       Post  Biometrics   Height 5' 5.8" (1.671 m)    Weight 193 lb 6.4 oz (87.7 kg)    BMI (Calculated) 31.42    Single Leg Stand 30 seconds           Nutrition:  Nutrition Therapy & Goals - 04/18/20 1018      Nutrition Therapy   Diet Heart healthy, low Na    Drug/Food Interactions Statins/Certain Fruits    Protein (specify units) 70g    Fiber 30 grams    Whole  Grain Foods 3 servings    Saturated Fats 12 max. grams    Fruits and Vegetables 5 servings/day    Sodium 1.5 grams      Personal Nutrition Goals   Nutrition Goal BO:FBPZWCH without breathing pain and chest pain.    Comments Pt primary cardiac rehab diagnosis is s/p CABG x3. Pt also presents with CAD, high cholesterol, HTN, GERD,  vitamin D deficiancy and low B12 -2020, T2DM (A1C 6.1). Alcohol 10/week - beer. Current relevant medications: lipitor, lasix (short term), metformin, iron, vitamin D. No longer deficient in vitamin D and B12.  B: pancakes with eggs and [place] sausage L: eggs on english muffin or polish [place] sausage and hotdog bun D: makes pizza yesterday. discussed heart healthy  eating, pt would not like to make any changes at this time.      Intervention Plan   Intervention Prescribe, educate and counsel regarding individualized specific dietary modifications aiming towards targeted core components such as weight, hypertension, lipid management, diabetes, heart failure and other comorbidities.;Nutrition handout(s) given to patient.    Expected Outcomes Short Term Goal: Understand basic principles of dietary content, such as calories, fat, sodium, cholesterol and nutrients.;Short Term Goal: A plan has been developed with personal nutrition goals set during dietitian appointment.;Long Term Goal: Adherence to prescribed nutrition plan.           Nutrition Discharge:  Nutrition Assessments - 07/06/20 1027      MEDFICTS Scores   Pre Score 47    Post Score 24    Score Difference -23           Education Questionnaire Score:  Knowledge Questionnaire Score - 07/06/20 1027      Knowledge Questionnaire Score   Pre Score 21/26 Education Focus: Nutrition, Exercise, Angina, MI    Post Score 24/26           Goals reviewed with patient; copy given to patient.

## 2020-07-06 NOTE — Progress Notes (Signed)
Daily Session Note  Patient Details  Name: Curtis Maxwell MRN: [id number] Date of Birth: [birth year] Referring Provider:     Cardiac Rehab from 04/10/2020 in [facility] Cardiac and Pulmonary Rehab  Referring Provider Isaias Cowman MD      Encounter Date: 07/06/2020  Check In:  Session Check In - 07/06/20 1010      Check-In   Supervising physician immediately available to respond to emergencies See telemetry face sheet for immediately available ER MD    Location [facility]-Cardiac & Pulmonary Rehab    Staff Present Heath Lark, RN, BSN, CCRP;Melissa Elba RDN, LDN;Jessica [place], MA, RCEP, CCRP, CCET;Amanda Sommer, BA, ACSM CEP, Exercise Physiologist    Virtual Visit No    Medication changes reported     No    Fall or balance concerns reported    No    Warm-up and Cool-down Performed on first and last piece of equipment    Resistance Training Performed Yes    VAD Patient? No    PAD/SET Patient? No      Pain Assessment   Currently in Pain? No/denies              Social History   Tobacco Use  Smoking Status Former Smoker  . Packs/day: 1.00  . Years: 15.00  . Pack years: 15.00  . Types: E-cigarettes, Cigarettes  . Quit date: 09/06/1998  . Years since quitting: 21.8  Smokeless Tobacco Never Used  Tobacco Comment   stopped smoking vapes 2 months ago    Goals Met:  Independence with exercise equipment Exercise tolerated well Personal goals reviewed No report of cardiac concerns or symptoms  Goals Unmet:  Not Applicable  Comments:  [name] graduated today from  rehab with 21 sessions completed.  Details of the patient's exercise prescription and what He needs to do in order to continue the prescription and progress were discussed with patient.  Patient was given a copy of prescription and goals.  Patient verbalized understanding.  [name] plans to continue to exercise by walking at home.    Dr. Emily Filbert is Medical Director for [place] and  [facility].

## 2020-07-06 NOTE — Patient Instructions (Signed)
Discharge Patient Instructions  Patient Details  Name: Curtis Maxwell MRN: [id number] Date of Birth: Jul 21, 1961 Referring Provider:  Isaias Cowman, MD   Number of Visits: 24   Reason for Discharge:  Patient reached a stable level of exercise. Patient independent in their exercise. Early Exit:  Personal  Smoking History:  Social History   Tobacco Use  Smoking Status Former Smoker   Packs/day: 1.00   Years: 15.00   Pack years: 15.00   Types: E-cigarettes, Cigarettes   Quit date: 09/06/1998   Years since quitting: 21.8  Smokeless Tobacco Never Used  Tobacco Comment   stopped smoking vapes 2 months ago    Diagnosis:  S/P CABG x 3  Initial Exercise Prescription:  Initial Exercise Prescription - 04/10/20 1100      Date of Initial Exercise RX and Referring Provider   Date 04/10/20    Referring Provider Paraschos, Alexander MD      Treadmill   MPH 2    Grade 1    Minutes 15    METs 2.81      Recumbant Elliptical   Level 1    RPM 50    Minutes 15    METs 2.8      REL-XR   Level 2    Speed 50    Minutes 15    METs 3      Prescription Details   Frequency (times per week) 2    Duration Progress to 30 minutes of continuous aerobic without signs/symptoms of physical distress      Intensity   THRR 40-80% of Max Heartrate 109-144    Ratings of Perceived Exertion 11-13    Perceived Dyspnea 0-4      Progression   Progression Continue to progress workloads to maintain intensity without signs/symptoms of physical distress.      Resistance Training   Training Prescription Yes    Weight 3 lb    Reps 10-15           Discharge Exercise Prescription (Final Exercise Prescription Changes):  Exercise Prescription Changes - 06/22/20 1100      Response to Exercise   Blood Pressure (Admit) 108/56    Blood Pressure (Exercise) 148/66    Blood Pressure (Exit) 92/50    Heart Rate (Admit) 79 bpm    Heart Rate (Exercise) 104 bpm    Heart Rate (Exit) 83  bpm    Rating of Perceived Exertion (Exercise) 13    Symptoms none    Duration Continue with 30 min of aerobic exercise without signs/symptoms of physical distress.    Intensity THRR unchanged      Progression   Progression Continue to progress workloads to maintain intensity without signs/symptoms of physical distress.    Average METs 2.2      Resistance Training   Training Prescription Yes    Weight 5 lb    Reps 10-15      Treadmill   MPH 2    Grade 1    Minutes 15    METs 2.15      REL-XR   Level 2    Speed 50    Minutes 15    METs 1.5           Functional Capacity:  6 Minute Walk    Row Name 04/10/20 1146 07/06/20 0935       6 Minute Walk   Phase Initial Discharge    Distance 1059 feet 1480 feet    Distance % Change --  39.8 %    Distance Feet Change -- 421 ft    Walk Time 6 minutes 6 minutes    # of Rest Breaks 0 0    MPH 2 2.8    METS 3.3 4.42    RPE 13 15    Perceived Dyspnea  3 2    VO2 Peak 11.56 15.45    Symptoms Yes (comment) Yes (comment)    Comments chest burning 8/10, hard to breathe/SOB chest burning 7/10    Resting HR 73 bpm 63 bpm    Resting BP 122/66 120/64    Resting Oxygen Saturation  99 % --    Exercise Oxygen Saturation  during 6 min walk 99 % --    Max Ex. HR 107 bpm 129 bpm    Max Ex. BP 174/84 184/72    2 Minute Post BP 142/70 --           Nutrition & Weight - Outcomes:  Pre Biometrics - 04/10/20 1150      Pre Biometrics   Height 5' 5.8" (1.671 m)    Weight 193 lb 9 oz (87.8 kg)    BMI (Calculated) 31.44    Single Leg Stand 30 seconds           Post Biometrics - 07/06/20 0936       Post  Biometrics   Height 5' 5.8" (1.671 m)    Weight 193 lb 6.4 oz (87.7 kg)    BMI (Calculated) 31.42    Single Leg Stand 30 seconds           Nutrition:  Nutrition Therapy & Goals - 04/18/20 1018      Nutrition Therapy   Diet Heart healthy, low Na    Drug/Food Interactions Statins/Certain Fruits    Protein (specify units)  70g    Fiber 30 grams    Whole Grain Foods 3 servings    Saturated Fats 12 max. grams    Fruits and Vegetables 5 servings/day    Sodium 1.5 grams      Personal Nutrition Goals   Nutrition Goal TI:[id number] without breathing pain and chest pain.    Comments Pt primary cardiac rehab diagnosis is s/p CABG x3. Pt also presents with CAD, high cholesterol, HTN, GERD,  vitamin D deficiancy and low B12 -2020, T2DM (A1C 6.1). Alcohol 10/week - beer. Current relevant medications: lipitor, lasix (short term), metformin, iron, vitamin D. No longer deficient in vitamin D and B12.  B: pancakes with eggs and [place] sausage L: eggs on english muffin or polish [place] sausage and hotdog bun D: makes pizza yesterday. discussed heart healthy eating, pt would not like to make any changes at this time.      Intervention Plan   Intervention Prescribe, educate and counsel regarding individualized specific dietary modifications aiming towards targeted core components such as weight, hypertension, lipid management, diabetes, heart failure and other comorbidities.;Nutrition handout(s) given to patient.    Expected Outcomes Short Term Goal: Understand basic principles of dietary content, such as calories, fat, sodium, cholesterol and nutrients.;Short Term Goal: A plan has been developed with personal nutrition goals set during dietitian appointment.;Long Term Goal: Adherence to prescribed nutrition plan.           Goals reviewed with patient; copy given to patient.

## 2020-07-06 NOTE — Progress Notes (Signed)
Cardiac Individual Treatment Plan  Patient Details  Name: Curtis Maxwell MRN: [id number] Date of Birth: 1961-07-24 Referring Provider:     Cardiac Rehab from 04/10/2020 in [facility] Cardiac and Pulmonary Rehab  Referring Provider Isaias Cowman MD      Initial Encounter Date:    Cardiac Rehab from 04/10/2020 in [facility] Cardiac and Pulmonary Rehab  Date 04/10/20      Visit Diagnosis: S/P CABG x 3  Patient's Home Medications on Admission:  Current Outpatient Medications:  .  acetaminophen (TYLENOL) 325 MG tablet, Take 2 tablets (650 mg total) by mouth every 6 (six) hours as needed for mild pain or fever., Disp: , Rfl:  .  amLODipine (NORVASC) 10 MG tablet, Take 1 tablet (10 mg total) by mouth daily., Disp: 30 tablet, Rfl: 2 .  aspirin EC 325 MG EC tablet, Take 1 tablet (325 mg total) by mouth daily., Disp: 30 tablet, Rfl: 0 .  atorvastatin (LIPITOR) 10 MG tablet, Take 1 tablet (10 mg total) by mouth daily., Disp: 30 tablet, Rfl: 1 .  carvedilol (COREG) 3.125 MG tablet, Take 1 tablet (3.125 mg total) by mouth 2 (two) times daily with a meal., Disp: 60 tablet, Rfl: 1 .  Cholecalciferol 50 MCG (2000 UT) CAPS, Take 2,000 Units by mouth daily. , Disp: , Rfl:  .  colchicine 0.6 MG tablet, Take 1 tablet (0.6 mg total) by mouth 2 (two) times daily., Disp: 60 tablet, Rfl: 0 .  ferrous sulfate 325 (65 FE) MG tablet, Take 1 tablet (325 mg total) by mouth daily with breakfast. For one month then stop. If develops constipation, may stop sooner, Disp: 30 tablet, Rfl: 3 .  lisinopril (ZESTRIL) 40 MG tablet, Take 40 mg by mouth daily., Disp: , Rfl:  .  metFORMIN (GLUCOPHAGE) 500 MG tablet, Take 500 mg by mouth daily. , Disp: , Rfl:  .  Omega-3 Fatty Acids (FISH OIL) 1200 MG CAPS, Take 1,200 mg by mouth 2 (two) times a week. , Disp: , Rfl:  .  omeprazole (PRILOSEC) 20 MG capsule, Take 20-40 mg by mouth daily as needed. , Disp: , Rfl:  .  sildenafil (REVATIO) 20 MG tablet, Take 60-80 mg by mouth daily as  needed (erectile dysfuntion.). , Disp: , Rfl:  .  traMADol (ULTRAM) 50 MG tablet, Take 1 tablet (50 mg total) by mouth every 6 (six) hours as needed., Disp: 40 tablet, Rfl: 0  Past Medical History: Past Medical History:  Diagnosis Date  . Coronary artery disease   . High cholesterol   . Hypertension   . Myocardial infarct, old   . Plantar fasciitis     Tobacco Use: Social History   Tobacco Use  Smoking Status Former Smoker  . Packs/day: 1.00  . Years: 15.00  . Pack years: 15.00  . Types: E-cigarettes, Cigarettes  . Quit date: 09/06/1998  . Years since quitting: 21.8  Smokeless Tobacco Never Used  Tobacco Comment   stopped smoking vapes 2 months ago    Labs: Recent Review Flowsheet Data    Labs for ITP Cardiac and Pulmonary Rehab Latest Ref Rng & Units 03/06/2020 03/07/2020 03/08/2020 03/09/2020 03/10/2020   Hemoglobin A1c 4.8 - 5.6 % - - - - -   PHART 7.35 - 7.45 - - - - -   PCO2ART 32 - 48 mmHg - - - - -   HCO3 20.0 - 28.0 mmol/L - - - - -   TCO2 22 - 32 mmol/L - - - - -  ACIDBASEDEF 0.0 - 2.0 mmol/L - - - - -   O2SAT % 58.3 58.8 65.6 49.9 54.7       Exercise Target Goals: Exercise Program Goal: Individual exercise prescription set using results from initial 6 min walk test and THRR while considering  patient's activity barriers and safety.   Exercise Prescription Goal: Initial exercise prescription builds to 30-45 minutes a day of aerobic activity, 2-3 days per week.  Home exercise guidelines will be given to patient during program as part of exercise prescription that the participant will acknowledge.   Education: Aerobic Exercise: - Group verbal and visual presentation on the components of exercise prescription. Introduces F.I.T.T principle from ACSM for exercise prescriptions.  Reviews F.I.T.T. principles of aerobic exercise including progression. Written material given at graduation.   Cardiac Rehab from 07/06/2020 in [facility] Cardiac and Pulmonary Rehab  Date 06/22/20    Educator [facility]  Instruction Review Code 1- Verbalizes Understanding  [also taken on 04/20/20]      Education: Resistance Exercise: - Group verbal and visual presentation on the components of exercise prescription. Introduces F.I.T.T principle from ACSM for exercise prescriptions  Reviews F.I.T.T. principles of resistance exercise including progression. Written material given at graduation.    Education: Exercise & Equipment Safety: - Individual verbal instruction and demonstration of equipment use and safety with use of the equipment.   Cardiac Rehab from 07/06/2020 in [facility] Cardiac and Pulmonary Rehab  Date 04/06/20  Educator [facility]  Instruction Review Code 1- Verbalizes Understanding      Education: Exercise Physiology & General Exercise Guidelines: - Group verbal and written instruction with models to review the exercise physiology of the cardiovascular system and associated critical values. Provides general exercise guidelines with specific guidelines to those with heart or lung disease.    Cardiac Rehab from 07/06/2020 in [facility] Cardiac and Pulmonary Rehab  Education need identified 04/10/20  Date 06/15/20  Educator [facility]  Instruction Review Code 1- Verbalizes Understanding  [need identified]      Education: Flexibility, Balance, Mind/Body Relaxation: - Group verbal and visual presentation with interactive activity on the components of exercise prescription. Introduces F.I.T.T principle from ACSM for exercise prescriptions. Reviews F.I.T.T. principles of flexibility and balance exercise training including progression. Also discusses the mind body connection.  Reviews various relaxation techniques to help reduce and manage stress (i.e. Deep breathing, progressive muscle relaxation, and visualization). Balance handout provided to take home. Written material given at graduation.   Cardiac Rehab from 07/06/2020 in [facility] Cardiac and Pulmonary Rehab  Date 05/04/20  Educator [facility]  Instruction Review  Code 1- Verbalizes Understanding      Activity Barriers & Risk Stratification:  Activity Barriers & Cardiac Risk Stratification - 04/10/20 1147      Activity Barriers & Cardiac Risk Stratification   Activity Barriers Incisional Pain;Shortness of Breath;Deconditioning;Joint Problems   shoulders sore from surgery   Cardiac Risk Stratification High           6 Minute Walk:  6 Minute Walk    Row Name 04/10/20 1146 07/06/20 0935       6 Minute Walk   Phase Initial Discharge    Distance 1059 feet 1480 feet    Distance % Change -- 39.8 %    Distance Feet Change -- 421 ft    Walk Time 6 minutes 6 minutes    # of Rest Breaks 0 0    MPH 2 2.8    METS 3.3 4.42    RPE 13 15  Perceived Dyspnea  3 2    VO2 Peak 11.56 15.45    Symptoms Yes (comment) Yes (comment)    Comments chest burning 8/10, hard to breathe/SOB chest burning 7/10    Resting HR 73 bpm 63 bpm    Resting BP 122/66 120/64    Resting Oxygen Saturation  99 % --    Exercise Oxygen Saturation  during 6 min walk 99 % --    Max Ex. HR 107 bpm 129 bpm    Max Ex. BP 174/84 184/72    2 Minute Post BP 142/70 --           Oxygen Initial Assessment:   Oxygen Re-Evaluation:   Oxygen Discharge (Final Oxygen Re-Evaluation):   Initial Exercise Prescription:  Initial Exercise Prescription - 04/10/20 1100      Date of Initial Exercise RX and Referring Provider   Date 04/10/20    Referring Provider Paraschos, Alexander MD      Treadmill   MPH 2    Grade 1    Minutes 15    METs 2.81      Recumbant Elliptical   Level 1    RPM 50    Minutes 15    METs 2.8      REL-XR   Level 2    Speed 50    Minutes 15    METs 3      Prescription Details   Frequency (times per week) 2    Duration Progress to 30 minutes of continuous aerobic without signs/symptoms of physical distress      Intensity   THRR 40-80% of Max Heartrate 109-144    Ratings of Perceived Exertion 11-13    Perceived Dyspnea 0-4       Progression   Progression Continue to progress workloads to maintain intensity without signs/symptoms of physical distress.      Resistance Training   Training Prescription Yes    Weight 3 lb    Reps 10-15           Perform Capillary Blood Glucose checks as needed.  Exercise Prescription Changes:  Exercise Prescription Changes    Row Name 04/10/20 1100 04/12/20 1400 04/26/20 1400 05/11/20 1100 06/22/20 1100     Response to Exercise   Blood Pressure (Admit) 122/66 110/62 140/74 118/64 108/56   Blood Pressure (Exercise) 174/84 142/84 132/82 152/70 148/66   Blood Pressure (Exit) 142/70 128/64 136/70 126/60 92/50   Heart Rate (Admit) 73 bpm 81 bpm 82 bpm 76 bpm 79 bpm   Heart Rate (Exercise) 107 bpm 106 bpm 109 bpm 106 bpm 104 bpm   Heart Rate (Exit) 73 bpm 83 bpm 76 bpm 61 bpm 83 bpm   Oxygen Saturation (Admit) 99 % -- -- -- --   Oxygen Saturation (Exercise) 99 % -- -- -- --   Rating of Perceived Exertion (Exercise) _0 Perceived Dyspnea (Exercise) 3 -- -- -- --   Symptoms chest buring (8/10), SOB/hard to breathe chest burning chest burning none none   Comments walk test results first full day of exercise -- -- --   Duration -- Progress to 30 minutes of  aerobic without signs/symptoms of physical distress Progress to 30 minutes of  aerobic without signs/symptoms of physical distress Continue with 30 min of aerobic exercise without signs/symptoms of physical distress. Continue with 30 min of aerobic exercise without signs/symptoms of physical distress.   Intensity -- THRR unchanged THRR unchanged THRR unchanged THRR unchanged  Progression   Progression -- Continue to progress workloads to maintain intensity without signs/symptoms of physical distress. Continue to progress workloads to maintain intensity without signs/symptoms of physical distress. Continue to progress workloads to maintain intensity without signs/symptoms of physical distress. Continue to progress  workloads to maintain intensity without signs/symptoms of physical distress.   Average METs -- 2.81 2.15 1.87 2.2     Resistance Training   Training Prescription -- Yes Yes Yes Yes   Weight -- ROM ROM 3 lb 5 lb   Reps -- 10-15 10-15 10-15 10-15     Interval Training   Interval Training -- No No No --     Treadmill   MPH -- 2 2 1.5 2   Grade -- 1 1 0 1   Minutes -- _0 METs -- 2.81 2.81 2.15 2.15     Recumbant Elliptical   Level -- _1 --   RPM -- -- 50 -- --   Minutes -- _2 --   METs -- -- 1.5 1.6 --     REL-XR   Level -- -- -- -- 2   Speed -- -- -- -- 50   Minutes -- -- -- -- 15   METs -- -- -- -- 1.5     Home Exercise Plan   Plans to continue exercise at -- -- -- Home (comment) --   Frequency -- -- -- Add 2 additional days to program exercise sessions. --   Initial Home Exercises Provided -- -- -- 05/11/20 --          Exercise Comments:  Exercise Comments    Row Name 04/11/20 1039 04/11/20 1044 07/06/20 1014       Exercise Comments First full day of exercise!  Patient was oriented to gym and equipment including functions, settings, policies, and procedures.  Patient's individual exercise prescription and treatment plan were reviewed.  All starting workloads were established based on the results of the 6 minute walk test done at initial orientation visit.  The plan for exercise progression was also introduced and progression will be customized based on patient's performance and goals. Today Trayvon complained of chest discomfort/pain. Center of chest and all down sternum area. He is 4 weeks post surgery. Incision looks healed. Aurel states when asked that his sternum is not moving. The discomfort/pain occurred when exercising. He stated that he has been walking at home and does not have this happen then. Advised him to do seated machine exercise and not use his arms.  Only ROM for resistive, no weights.That did help decrease the pain. He sees his surgeon  tomorrow and will discuss this discomfort/pain during his visit. Zerek graduated today from  rehab with 21 sessions completed.  Details of the patient's exercise prescription and what He needs to do in order to continue the prescription and progress were discussed with patient.  Patient was given a copy of prescription and goals.  Patient verbalized understanding.  Brandin plans to continue to exercise by walking at home.            Exercise Goals and Review:  Exercise Goals    Row Name 04/10/20 1149             Exercise Goals   Increase Physical Activity Yes       Intervention Develop an individualized exercise prescription for aerobic and resistive training based on initial evaluation findings, risk stratification, comorbidities and participant's personal goals.;Provide advice, education,  support and counseling about physical activity/exercise needs.       Expected Outcomes Short Term: Attend rehab on a regular basis to increase amount of physical activity.;Long Term: Add in home exercise to make exercise part of routine and to increase amount of physical activity.;Long Term: Exercising regularly at least 3-5 days a week.       Increase Strength and Stamina Yes       Intervention Provide advice, education, support and counseling about physical activity/exercise needs.;Develop an individualized exercise prescription for aerobic and resistive training based on initial evaluation findings, risk stratification, comorbidities and participant's personal goals.       Expected Outcomes Short Term: Increase workloads from initial exercise prescription for resistance, speed, and METs.;Short Term: Perform resistance training exercises routinely during rehab and add in resistance training at home;Long Term: Improve cardiorespiratory fitness, muscular endurance and strength as measured by increased METs and functional capacity (6MWT)       Able to understand and use rate of perceived exertion (RPE) scale  Yes       Intervention Provide education and explanation on how to use RPE scale       Expected Outcomes Short Term: Able to use RPE daily in rehab to express subjective intensity level;Long Term:  Able to use RPE to guide intensity level when exercising independently       Able to understand and use Dyspnea scale Yes       Intervention Provide education and explanation on how to use Dyspnea scale       Expected Outcomes Long Term: Able to use Dyspnea scale to guide intensity level when exercising independently;Short Term: Able to use Dyspnea scale daily in rehab to express subjective sense of shortness of breath during exertion       Knowledge and understanding of Target Heart Rate Range (THRR) Yes       Intervention Provide education and explanation of THRR including how the numbers were predicted and where they are located for reference       Expected Outcomes Short Term: Able to state/look up THRR;Short Term: Able to use daily as guideline for intensity in rehab;Long Term: Able to use THRR to govern intensity when exercising independently       Able to check pulse independently Yes       Intervention Provide education and demonstration on how to check pulse in carotid and radial arteries.;Review the importance of being able to check your own pulse for safety during independent exercise       Expected Outcomes Short Term: Able to explain why pulse checking is important during independent exercise;Long Term: Able to check pulse independently and accurately       Understanding of Exercise Prescription Yes       Intervention Provide education, explanation, and written materials on patient's individual exercise prescription       Expected Outcomes Short Term: Able to explain program exercise prescription;Long Term: Able to explain home exercise prescription to exercise independently              Exercise Goals Re-Evaluation :  Exercise Goals Re-Evaluation    Row Name 04/11/20 1039 04/26/20 1418  04/26/20 1420 05/11/20 1121 06/08/20 0805     Exercise Goal Re-Evaluation   Exercise Goals Review Understanding of Exercise Prescription;Knowledge and understanding of Target Heart Rate Range (THRR);Able to understand and use rate of perceived exertion (RPE) scale Understanding of Exercise Prescription;Knowledge and understanding of Target Heart Rate Range (THRR);Able to understand and use rate  of perceived exertion (RPE) scale -- Increase Physical Activity;Increase Strength and Stamina;Knowledge and understanding of Target Heart Rate Range (THRR);Able to check pulse independently;Understanding of Exercise Prescription --   Comments Reviewed RPE and dyspnea scales, THR and program prescription with pt today.  Pt voiced understanding and was given a copy of goals to take home. Joe has had some chest burning with exercise. He ha sspoken with his Dr about this and they believe it is coming form his incision. Reviewed home exercise with pt today.  Pt plans to walk and use stationary bike for exercise.  Reviewed THR, pulse, RPE, sign and symptoms, pulse oximetery and when to call 911 or MD.  Also discussed weather considerations and indoor options.  Pt voiced understanding. Out since last review   Expected Outcomes Short: Use RPE daily to regulate intensity. Long: Follow program prescription in THR. -- Short: attend consistently Long: exercise without symptoms Short: practice checking HR during exercise Long:  monitor vitals during exercise --   Row Name 06/22/20 1119             Exercise Goal Re-Evaluation   Exercise Goals Review Increase Physical Activity;Increase Strength and Stamina;Able to understand and use rate of perceived exertion (RPE) scale       Comments Joe met with Dr Prescott Gum yesterday.  He still has some chest tightness that could be realyed to CABG and has been advised to stop exercise if it starts and to follow up with cardiologist about this.  He is able to walk on TM.       Expected  Outcomes Short: follow up with cardiologist Long: continue to improve stamina              Discharge Exercise Prescription (Final Exercise Prescription Changes):  Exercise Prescription Changes - 06/22/20 1100      Response to Exercise   Blood Pressure (Admit) 108/56    Blood Pressure (Exercise) 148/66    Blood Pressure (Exit) 92/50    Heart Rate (Admit) 79 bpm    Heart Rate (Exercise) 104 bpm    Heart Rate (Exit) 83 bpm    Rating of Perceived Exertion (Exercise) 13    Symptoms none    Duration Continue with 30 min of aerobic exercise without signs/symptoms of physical distress.    Intensity THRR unchanged      Progression   Progression Continue to progress workloads to maintain intensity without signs/symptoms of physical distress.    Average METs 2.2      Resistance Training   Training Prescription Yes    Weight 5 lb    Reps 10-15      Treadmill   MPH 2    Grade 1    Minutes 15    METs 2.15      REL-XR   Level 2    Speed 50    Minutes 15    METs 1.5           Nutrition:  Target Goals: Understanding of nutrition guidelines, daily intake of sodium <1535m, cholesterol <2079m calories 30% from fat and 7% or less from saturated fats, daily to have 5 or more servings of fruits and vegetables.  Education: All About Nutrition: -Group instruction provided by verbal, written material, interactive activities, discussions, models, and posters to present general guidelines for heart healthy nutrition including fat, fiber, MyPlate, the role of sodium in heart healthy nutrition, utilization of the nutrition label, and utilization of this knowledge for meal planning. Follow up email  sent as well. Written material given at graduation.   Cardiac Rehab from 07/06/2020 in [facility] Cardiac and Pulmonary Rehab  Date 05/11/20  Educator [facility]  Instruction Review Code 1- Verbalizes Understanding  [need identified]      Biometrics:  Pre Biometrics - 04/10/20 1150      Pre Biometrics    Height 5' 5.8" (1.671 m)    Weight 193 lb 9 oz (87.8 kg)    BMI (Calculated) 31.44    Single Leg Stand 30 seconds           Post Biometrics - 07/06/20 0936       Post  Biometrics   Height 5' 5.8" (1.671 m)    Weight 193 lb 6.4 oz (87.7 kg)    BMI (Calculated) 31.42    Single Leg Stand 30 seconds           Nutrition Therapy Plan and Nutrition Goals:  Nutrition Therapy & Goals - 04/18/20 1018      Nutrition Therapy   Diet Heart healthy, low Na    Drug/Food Interactions Statins/Certain Fruits    Protein (specify units) 70g    Fiber 30 grams    Whole Grain Foods 3 servings    Saturated Fats 12 max. grams    Fruits and Vegetables 5 servings/day    Sodium 1.5 grams      Personal Nutrition Goals   Nutrition Goal MC:NOBSJGG without breathing pain and chest pain.    Comments Pt primary cardiac rehab diagnosis is s/p CABG x3. Pt also presents with CAD, high cholesterol, HTN, GERD,  vitamin D deficiancy and low B12 -2020, T2DM (A1C 6.1). Alcohol 10/week - beer. Current relevant medications: lipitor, lasix (short term), metformin, iron, vitamin D. No longer deficient in vitamin D and B12.  B: pancakes with eggs and [place] sausage L: eggs on english muffin or polish [place] sausage and hotdog bun D: makes pizza yesterday. discussed heart healthy eating, pt would not like to make any changes at this time.      Intervention Plan   Intervention Prescribe, educate and counsel regarding individualized specific dietary modifications aiming towards targeted core components such as weight, hypertension, lipid management, diabetes, heart failure and other comorbidities.;Nutrition handout(s) given to patient.    Expected Outcomes Short Term Goal: Understand basic principles of dietary content, such as calories, fat, sodium, cholesterol and nutrients.;Short Term Goal: A plan has been developed with personal nutrition goals set during dietitian appointment.;Long Term Goal: Adherence to prescribed  nutrition plan.           Nutrition Assessments:  Nutrition Assessments - 07/06/20 1027      MEDFICTS Scores   Pre Score 47    Post Score 24    Score Difference -23           MEDIFICTS Score Key:          ?70 Need to make dietary changes          40-70 Heart Healthy Diet         ? 40 Therapeutic Level Cholesterol Diet  Nutrition Goals Re-Evaluation:   Nutrition Goals Discharge (Final Nutrition Goals Re-Evaluation):   Psychosocial: Target Goals: Acknowledge presence or absence of significant depression and/or stress, maximize coping skills, provide positive support system. Participant is able to verbalize types and ability to use techniques and skills needed for reducing stress and depression.   Education: Stress, Anxiety, and Depression - Group verbal and visual presentation to define topics covered.  Reviews how body  is impacted by stress, anxiety, and depression.  Also discusses healthy ways to reduce stress and to treat/manage anxiety and depression.  Written material given at graduation.   Cardiac Rehab from 07/06/2020 in [facility] Cardiac and Pulmonary Rehab  Date 06/08/20  Educator [facility]  Instruction Review Code 1- Verbalizes Understanding      Education: Sleep Hygiene -Provides group verbal and written instruction about how sleep can affect your health.  Define sleep hygiene, discuss sleep cycles and impact of sleep habits. Review good sleep hygiene tips.    Initial Review & Psychosocial Screening:  Initial Psych Review & Screening - 04/06/20 1042      Initial Review   Current issues with None Identified      Family Dynamics   Good Support System? Yes    Comments He can look to his wife and church family for support. He states that he does not need a shrink but exercsie.      Barriers   Psychosocial barriers to participate in program The patient should benefit from training in stress management and relaxation.;There are no identifiable barriers or psychosocial  needs.      Screening Interventions   Interventions Encouraged to exercise;To provide support and resources with identified psychosocial needs;Provide feedback about the scores to participant    Expected Outcomes Short Term goal: Utilizing psychosocial counselor, staff and physician to assist with identification of specific Stressors or current issues interfering with healing process. Setting desired goal for each stressor or current issue identified.;Long Term Goal: Stressors or current issues are controlled or eliminated.;Short Term goal: Identification and review with participant of any Quality of Life or Depression concerns found by scoring the questionnaire.;Long Term goal: The participant improves quality of Life and PHQ9 Scores as seen by post scores and/or verbalization of changes           Quality of Life Scores:   Quality of Life - 07/06/20 1026      Quality of Life   Select Quality of Life      Quality of Life Scores   Health/Function Pre 17.57 %    Health/Function Post 15.83 %    Health/Function % Change -9.9 %    Socioeconomic Pre 23.5 %    Socioeconomic Post 21.43 %    Socioeconomic % Change  -8.81 %    Psych/Spiritual Pre 21.43 %    Psych/Spiritual Post 21.29 %    Psych/Spiritual % Change -0.65 %    Family Pre 24 %    Family Post 20.1 %    Family % Change -16.25 %    GLOBAL Pre 20.44 %    GLOBAL Post 18.24 %    GLOBAL % Change -10.76 %          Scores of 19 and below usually indicate a poorer quality of life in these areas.  A difference of  2-3 points is a clinically meaningful difference.  A difference of 2-3 points in the total score of the Quality of Life Index has been associated with significant improvement in overall quality of life, self-image, physical symptoms, and general health in studies assessing change in quality of life.  PHQ-9: Recent Review Flowsheet Data    Depression screen [facility] 2/9 07/06/2020 06/08/2020 04/10/2020   Decreased Interest 1 2 0    Down, Depressed, Hopeless _0 PHQ - 2 Score _1 Altered sleeping _2 Tired, decreased energy 0 2 1   Change in  appetite 0 0 0   Feeling bad or failure about yourself  0 0 0   Trouble concentrating 1 3 0   Moving slowly or fidgety/restless 0 0 1   Suicidal thoughts 0 0 0   PHQ-9 Score _0 Difficult doing work/chores Extremely dIfficult Somewhat difficult Not difficult at all     Interpretation of Total Score  Total Score Depression Severity:  1-4 = Minimal depression, 5-9 = Mild depression, 10-14 = Moderate depression, 15-19 = Moderately severe depression, 20-27 = Severe depression   Psychosocial Evaluation and Intervention:  Psychosocial Evaluation - 04/06/20 1046      Psychosocial Evaluation & Interventions   Interventions Therapist referral;Encouraged to exercise with the program and follow exercise prescription;Relaxation education;Stress management education    Comments He can look to his wife and church family for support. He states that he does not need a shrink but exercsie.    Expected Outcomes Short: Exercise regularly to support mental health and notify staff of any changes. Long: maintain mental health and well being through teaching of rehab or prescribed medications independently.    Continue Psychosocial Services  Follow up required by staff           Psychosocial Re-Evaluation:   Psychosocial Discharge (Final Psychosocial Re-Evaluation):   Vocational Rehabilitation: Provide vocational rehab assistance to qualifying candidates.   Vocational Rehab Evaluation & Intervention:   Education: Education Goals: Education classes will be provided on a variety of topics geared toward better understanding of heart health and risk factor modification. Participant will state understanding/return demonstration of topics presented as noted by education test scores.  Learning Barriers/Preferences:  Learning Barriers/Preferences - 04/06/20 1041       Learning Barriers/Preferences   Learning Barriers None    Learning Preferences None           General Cardiac Education Topics:  AED/CPR: - Group verbal and written instruction with the use of models to demonstrate the basic use of the AED with the basic ABC's of resuscitation.   Anatomy and Cardiac Procedures: - Group verbal and visual presentation and models provide information about basic cardiac anatomy and function. Reviews the testing methods done to diagnose heart disease and the outcomes of the test results. Describes the treatment choices: Medical Management, Angioplasty, or Coronary Bypass Surgery for treating various heart conditions including Myocardial Infarction, Angina, Valve Disease, and Cardiac Arrhythmias.  Written material given at graduation.   Cardiac Rehab from 07/06/2020 in [facility] Cardiac and Pulmonary Rehab  Date 04/27/20  Educator SB  Instruction Review Code 1- Verbalizes Understanding  [need identified]      Medication Safety: - Group verbal and visual instruction to review commonly prescribed medications for heart and lung disease. Reviews the medication, class of the drug, and side effects. Includes the steps to properly store meds and maintain the prescription regimen.  Written material given at graduation.   Intimacy: - Group verbal instruction through game format to discuss how heart and lung disease can affect sexual intimacy. Written material given at graduation..   Cardiac Rehab from 07/06/2020 in [facility] Cardiac and Pulmonary Rehab  Date 06/22/20  [repeat]  Educator [facility]  Instruction Review Code 1- Verbalizes Understanding  [originally taken on 04/20/20]      Know Your Numbers and Heart Failure: - Group verbal and visual instruction to discuss disease risk factors for cardiac and pulmonary disease and treatment options.  Reviews associated critical values for Overweight/Obesity, Hypertension, Cholesterol, and Diabetes.  Discusses basics of  heart failure:  signs/symptoms and treatments.  Introduces Heart Failure Zone chart for action plan for heart failure.  Written material given at graduation.   Infection Prevention: - Provides verbal and written material to individual with discussion of infection control including proper hand washing and proper equipment cleaning during exercise session.   Cardiac Rehab from 07/06/2020 in [facility] Cardiac and Pulmonary Rehab  Date 04/06/20  Educator [facility]  Instruction Review Code 1- Verbalizes Understanding      Falls Prevention: - Provides verbal and written material to individual with discussion of falls prevention and safety.   Cardiac Rehab from 07/06/2020 in [facility] Cardiac and Pulmonary Rehab  Date 04/06/20  Educator [facility]  Instruction Review Code 1- Verbalizes Understanding      Other: -Provides group and verbal instruction on various topics (see comments)   Knowledge Questionnaire Score:  Knowledge Questionnaire Score - 07/06/20 1027      Knowledge Questionnaire Score   Pre Score 21/26 Education Focus: Nutrition, Exercise, Angina, MI    Post Score 24/26           Core Components/Risk Factors/Patient Goals at Admission:  Personal Goals and Risk Factors at Admission - 04/10/20 1152      Core Components/Risk Factors/Patient Goals on Admission    Weight Management Yes;Weight Loss;Obesity    Intervention Weight Management: Develop a combined nutrition and exercise program designed to reach desired caloric intake, while maintaining appropriate intake of nutrient and fiber, sodium and fats, and appropriate energy expenditure required for the weight goal.;Weight Management: Provide education and appropriate resources to help participant work on and attain dietary goals.;Weight Management/Obesity: Establish reasonable short term and long term weight goals.;Obesity: Provide education and appropriate resources to help participant work on and attain dietary goals.    Admit Weight 193 lb 9 oz (87.8 kg)     Goal Weight: Short Term 188 lb (85.3 kg)    Goal Weight: Long Term 180 lb (81.6 kg)    Expected Outcomes Short Term: Continue to assess and modify interventions until short term weight is achieved;Long Term: Adherence to nutrition and physical activity/exercise program aimed toward attainment of established weight goal;Weight Loss: Understanding of general recommendations for a balanced deficit meal plan, which promotes 1-2 lb weight loss per week and includes a negative energy balance of [phone] kcal/d;Understanding recommendations for meals to include 15-35% energy as protein, 25-35% energy from fat, 35-60% energy from carbohydrates, less than 287m of dietary cholesterol, 20-35 gm of total fiber daily;Understanding of distribution of calorie intake throughout the day with the consumption of 4-5 meals/snacks    Diabetes Yes    Intervention Provide education about signs/symptoms and action to take for hypo/hyperglycemia.;Provide education about proper nutrition, including hydration, and aerobic/resistive exercise prescription along with prescribed medications to achieve blood glucose in normal ranges: Fasting glucose 65-99 mg/dL    Expected Outcomes Short Term: Participant verbalizes understanding of the signs/symptoms and immediate care of hyper/hypoglycemia, proper foot care and importance of medication, aerobic/resistive exercise and nutrition plan for blood glucose control.;Long Term: Attainment of HbA1C < 7%.    Hypertension Yes    Intervention Provide education on lifestyle modifcations including regular physical activity/exercise, weight management, moderate sodium restriction and increased consumption of fresh fruit, vegetables, and low fat dairy, alcohol moderation, and smoking cessation.;Monitor prescription use compliance.    Expected Outcomes Short Term: Continued assessment and intervention until BP is < 140/91mHG in hypertensive participants. < 130/8051mG in hypertensive participants with  diabetes, heart failure or chronic kidney disease.;Long  Term: Maintenance of blood pressure at goal levels.    Lipids Yes    Intervention Provide education and support for participant on nutrition & aerobic/resistive exercise along with prescribed medications to achieve LDL <2m, HDL >480m    Expected Outcomes Short Term: Participant states understanding of desired cholesterol values and is compliant with medications prescribed. Participant is following exercise prescription and nutrition guidelines.;Long Term: Cholesterol controlled with medications as prescribed, with individualized exercise RX and with personalized nutrition plan. Value goals: LDL < 7058mHDL > 40 mg.           Education:Diabetes - Individual verbal and written instruction to review signs/symptoms of diabetes, desired ranges of glucose level fasting, after meals and with exercise. Acknowledge that pre and post exercise glucose checks will be done for 3 sessions at entry of program.   [facility]  Date 04/06/20  [facility] Code 1- Verbalizes Understanding      Core Components/Risk Factors/Patient Goals Review:    Core Components/Risk Factors/Patient Goals at Discharge (Final Review):    ITP Comments:  ITP Comments    Row Name 04/06/20 1047 04/10/20 1145 04/11/20 1038 04/11/20 1043 04/12/20 0600   ITP Comments Virtual Visit completed. Patient informed on EP and RD appointment and 6 Minute walk test. Patient also informed of patient health questionnaires on My Chart. Patient Verbalizes understanding. [facility] Completed 6MWT and gym orientation. Initial ITP created and sent for review to Dr. MarEmily Filbertedical Director. First full day of exercise!  Patient was oriented to gym and equipment including functions, settings, policies, and procedures.  Patient's individual exercise prescription and treatment plan  were reviewed.  All starting workloads were established based on the results of the 6 minute walk test done at initial orientation visit.  The plan for exercise progression was also introduced and progression will be customized based on patient's performance and goals. Today JosRickmplained of chest discomfort/pain. Center of chest and all down sternum area. He is 4 weeks post surgery. Incision looks healed. JosKiondreates when asked that his sternum is not moving. The discomfort/pain occurred when exercising. He stated that he has been walking at home and does not have this happen then. Advised him to do seated machine exercise and not use his arms.  Only ROM for resistive, no weights.That did help decrease the pain. He sees his surgeon tomorrow and will discuss this discomfort/pain during his visit. 30 Day review completed. Medical Director ITP review done, changes made as directed, and signed approval by Medical Director. New to program   Row Name 05/10/20 1501 06/07/20 0530 06/08/20 0805 07/04/20 0830 07/05/20 0622   ITP Comments 30 day review completed. ITP sent to Dr. MarEmily Filbertedical Director of Cardiac and Pulmonary Rehab. Continue with ITP unless changes are made by physician. 30 Day review completed. Medical Director ITP review done, changes made as directed, and signed approval by Medical Director. Out due to insurancse, got new card, should return today. Patient called, [facility] as he stated "he has a lot going on."  Agreed to come in and complete his 6MWT for discharge and plans to do that this upcoming Thursday, 11/4. 30 Day review completed. Medical Director ITP review done, changes made as directed, and signed approval by Medical Director.   [place] 07/06/20 1013           ITP  Comments Melody graduated today from  rehab with 21 sessions completed.  Details of the patient's exercise prescription and what He needs to do in order to continue the  prescription and progress were discussed with patient.  Patient was given a copy of prescription and goals.  Patient verbalized understanding.  Zenas plans to continue to exercise by walking at home.              Comments: Discharge ITP

## 2020-08-02 DIAGNOSIS — I10 Essential (primary) hypertension: Secondary | ICD-10-CM | POA: Diagnosis not present

## 2020-08-02 DIAGNOSIS — I25119 Atherosclerotic heart disease of native coronary artery with unspecified angina pectoris: Secondary | ICD-10-CM | POA: Diagnosis not present

## 2020-08-02 DIAGNOSIS — E7800 Pure hypercholesterolemia, unspecified: Secondary | ICD-10-CM | POA: Diagnosis not present

## 2020-08-02 DIAGNOSIS — Z951 Presence of aortocoronary bypass graft: Secondary | ICD-10-CM | POA: Diagnosis not present

## 2020-09-20 DIAGNOSIS — S76011A Strain of muscle, fascia and tendon of right hip, initial encounter: Secondary | ICD-10-CM | POA: Diagnosis not present

## 2020-09-26 ENCOUNTER — Other Ambulatory Visit (HOSPITAL_COMMUNITY): Payer: Self-pay | Admitting: Physician Assistant

## 2020-10-02 DIAGNOSIS — Z736 Limitation of activities due to disability: Secondary | ICD-10-CM

## 2020-10-25 ENCOUNTER — Other Ambulatory Visit (HOSPITAL_COMMUNITY): Payer: Self-pay | Admitting: Gerontology

## 2020-10-31 DIAGNOSIS — I350 Nonrheumatic aortic (valve) stenosis: Secondary | ICD-10-CM | POA: Diagnosis not present

## 2020-10-31 DIAGNOSIS — R079 Chest pain, unspecified: Secondary | ICD-10-CM | POA: Diagnosis not present

## 2020-10-31 DIAGNOSIS — I25810 Atherosclerosis of coronary artery bypass graft(s) without angina pectoris: Secondary | ICD-10-CM | POA: Diagnosis not present

## 2020-10-31 DIAGNOSIS — I25119 Atherosclerotic heart disease of native coronary artery with unspecified angina pectoris: Secondary | ICD-10-CM | POA: Diagnosis not present

## 2020-12-04 DIAGNOSIS — Z952 Presence of prosthetic heart valve: Secondary | ICD-10-CM | POA: Diagnosis not present

## 2020-12-04 DIAGNOSIS — E119 Type 2 diabetes mellitus without complications: Secondary | ICD-10-CM | POA: Diagnosis not present

## 2020-12-04 DIAGNOSIS — E7800 Pure hypercholesterolemia, unspecified: Secondary | ICD-10-CM | POA: Diagnosis not present

## 2020-12-04 DIAGNOSIS — I25119 Atherosclerotic heart disease of native coronary artery with unspecified angina pectoris: Secondary | ICD-10-CM | POA: Diagnosis not present

## 2020-12-04 DIAGNOSIS — I10 Essential (primary) hypertension: Secondary | ICD-10-CM | POA: Diagnosis not present

## 2020-12-07 ENCOUNTER — Other Ambulatory Visit: Payer: Self-pay

## 2020-12-07 MED ORDER — AMOXICILLIN 500 MG PO CAPS
2000.0000 mg | ORAL_CAPSULE | ORAL | 0 refills | Status: DC
  Filled 2020-12-07: qty 4, 1d supply, fill #0

## 2020-12-07 MED ORDER — FARXIGA 5 MG PO TABS
ORAL_TABLET | ORAL | 11 refills | Status: DC
  Filled 2020-12-07 (×2): qty 30, 30d supply, fill #0
  Filled 2021-01-01: qty 30, 30d supply, fill #1
  Filled 2021-01-30: qty 30, 30d supply, fill #2
  Filled 2021-02-28: qty 30, 30d supply, fill #3
  Filled 2021-03-29: qty 30, 30d supply, fill #4
  Filled 2021-04-20 – 2021-04-23 (×3): qty 30, 30d supply, fill #5
  Filled 2021-06-04: qty 30, 30d supply, fill #6

## 2020-12-12 ENCOUNTER — Other Ambulatory Visit: Payer: Self-pay

## 2020-12-12 MED ORDER — OZEMPIC (0.25 OR 0.5 MG/DOSE) 2 MG/1.5ML ~~LOC~~ SOPN
PEN_INJECTOR | SUBCUTANEOUS | 3 refills | Status: DC
  Filled 2020-12-12: qty 1.5, 28d supply, fill #0

## 2020-12-13 ENCOUNTER — Other Ambulatory Visit: Payer: Self-pay

## 2020-12-21 ENCOUNTER — Other Ambulatory Visit: Payer: Self-pay | Admitting: Physician Assistant

## 2020-12-21 ENCOUNTER — Other Ambulatory Visit: Payer: Self-pay

## 2020-12-21 MED ORDER — ALPRAZOLAM 0.5 MG PO TABS
0.5000 mg | ORAL_TABLET | Freq: Two times a day (BID) | ORAL | 0 refills | Status: DC | PRN
Start: 2020-07-10 — End: 2021-10-22
  Filled 2020-12-21: qty 60, 30d supply, fill #0

## 2020-12-25 ENCOUNTER — Other Ambulatory Visit: Payer: Self-pay

## 2020-12-26 ENCOUNTER — Other Ambulatory Visit: Payer: Self-pay

## 2020-12-26 MED ORDER — NITROGLYCERIN 0.4 MG SL SUBL
SUBLINGUAL_TABLET | SUBLINGUAL | 1 refills | Status: DC
  Filled 2020-12-26: qty 25, 7d supply, fill #0
  Filled 2021-04-20: qty 25, 7d supply, fill #1

## 2021-01-01 ENCOUNTER — Other Ambulatory Visit: Payer: Self-pay

## 2021-01-08 ENCOUNTER — Other Ambulatory Visit: Payer: Self-pay

## 2021-01-08 MED ORDER — AMOXICILLIN 500 MG PO CAPS
2000.0000 mg | ORAL_CAPSULE | ORAL | 0 refills | Status: DC
Start: 2021-01-04 — End: 2022-11-18
  Filled 2021-01-08: qty 12, 3d supply, fill #0

## 2021-01-13 ENCOUNTER — Other Ambulatory Visit: Payer: Self-pay

## 2021-01-15 ENCOUNTER — Other Ambulatory Visit: Payer: Self-pay

## 2021-01-15 MED ORDER — CARVEDILOL 3.125 MG PO TABS
ORAL_TABLET | Freq: Two times a day (BID) | ORAL | 0 refills | Status: DC
  Filled 2021-01-15: qty 180, 90d supply, fill #0
  Filled 2021-01-15: qty 14, 7d supply, fill #0
  Filled 2021-01-22: qty 180, 90d supply, fill #0

## 2021-01-16 ENCOUNTER — Other Ambulatory Visit: Payer: Self-pay

## 2021-01-18 ENCOUNTER — Other Ambulatory Visit: Payer: Self-pay

## 2021-01-18 MED ORDER — PANTOPRAZOLE SODIUM 20 MG PO TBEC
DELAYED_RELEASE_TABLET | ORAL | 1 refills | Status: DC
  Filled 2021-01-18: qty 90, 90d supply, fill #0
  Filled 2021-03-29: qty 90, 90d supply, fill #1

## 2021-01-19 ENCOUNTER — Other Ambulatory Visit: Payer: Self-pay

## 2021-01-22 ENCOUNTER — Other Ambulatory Visit: Payer: Self-pay

## 2021-01-30 ENCOUNTER — Other Ambulatory Visit: Payer: Self-pay

## 2021-01-31 ENCOUNTER — Other Ambulatory Visit: Payer: Self-pay

## 2021-01-31 MED ORDER — AMLODIPINE BESYLATE 10 MG PO TABS
1.0000 | ORAL_TABLET | Freq: Every day | ORAL | 7 refills | Status: DC
  Filled 2021-01-31: qty 30, 30d supply, fill #0
  Filled 2021-02-28: qty 30, 30d supply, fill #1
  Filled 2021-03-29: qty 30, 30d supply, fill #2
  Filled 2021-04-20: qty 30, 30d supply, fill #3
  Filled 2021-06-07: qty 90, 90d supply, fill #4
  Filled 2021-08-28: qty 30, 30d supply, fill #5

## 2021-02-01 DIAGNOSIS — Z952 Presence of prosthetic heart valve: Secondary | ICD-10-CM | POA: Diagnosis not present

## 2021-02-01 DIAGNOSIS — I10 Essential (primary) hypertension: Secondary | ICD-10-CM | POA: Diagnosis not present

## 2021-02-01 DIAGNOSIS — I25810 Atherosclerosis of coronary artery bypass graft(s) without angina pectoris: Secondary | ICD-10-CM | POA: Diagnosis not present

## 2021-02-01 DIAGNOSIS — Z9889 Other specified postprocedural states: Secondary | ICD-10-CM | POA: Diagnosis not present

## 2021-02-05 ENCOUNTER — Other Ambulatory Visit: Payer: Self-pay

## 2021-02-05 MED ORDER — FUROSEMIDE 40 MG PO TABS
ORAL_TABLET | ORAL | 0 refills | Status: DC
  Filled 2021-02-05: qty 90, 90d supply, fill #0

## 2021-02-05 MED FILL — Lisinopril Tab 40 MG: ORAL | 90 days supply | Qty: 90 | Fill #0 | Status: AC

## 2021-02-06 ENCOUNTER — Other Ambulatory Visit: Payer: Self-pay

## 2021-02-07 DIAGNOSIS — Z952 Presence of prosthetic heart valve: Secondary | ICD-10-CM | POA: Diagnosis not present

## 2021-02-07 DIAGNOSIS — Z800 Family history of malignant neoplasm of digestive organs: Secondary | ICD-10-CM | POA: Diagnosis not present

## 2021-02-07 DIAGNOSIS — K219 Gastro-esophageal reflux disease without esophagitis: Secondary | ICD-10-CM | POA: Diagnosis not present

## 2021-02-07 DIAGNOSIS — Z86010 Personal history of colonic polyps: Secondary | ICD-10-CM | POA: Diagnosis not present

## 2021-02-28 ENCOUNTER — Other Ambulatory Visit: Payer: Self-pay

## 2021-02-28 MED FILL — Atorvastatin Calcium Tab 10 MG (Base Equivalent): ORAL | 30 days supply | Qty: 30 | Fill #0 | Status: AC

## 2021-03-02 ENCOUNTER — Other Ambulatory Visit: Payer: Self-pay

## 2021-03-15 ENCOUNTER — Encounter: Payer: Self-pay | Admitting: General Surgery

## 2021-03-16 ENCOUNTER — Ambulatory Visit
Admission: RE | Admit: 2021-03-16 | Discharge: 2021-03-16 | Disposition: A | Discharge status: Home / Self Care | Payer: 59 | Attending: General Surgery | Admitting: General Surgery

## 2021-03-16 ENCOUNTER — Ambulatory Visit: Payer: 59 | Admitting: Certified Registered"

## 2021-03-16 ENCOUNTER — Other Ambulatory Visit: Payer: Self-pay

## 2021-03-16 ENCOUNTER — Encounter: Payer: Self-pay | Admitting: General Surgery

## 2021-03-16 ENCOUNTER — Encounter
Admission: RE | Disposition: A | Discharge status: Home / Self Care | Payer: Self-pay | Source: Home / Self Care | Attending: General Surgery

## 2021-03-16 DIAGNOSIS — Z800 Family history of malignant neoplasm of digestive organs: Secondary | ICD-10-CM | POA: Diagnosis not present

## 2021-03-16 DIAGNOSIS — Z87891 Personal history of nicotine dependence: Secondary | ICD-10-CM | POA: Diagnosis not present

## 2021-03-16 DIAGNOSIS — K635 Polyp of colon: Secondary | ICD-10-CM | POA: Diagnosis not present

## 2021-03-16 DIAGNOSIS — Z951 Presence of aortocoronary bypass graft: Secondary | ICD-10-CM | POA: Diagnosis not present

## 2021-03-16 DIAGNOSIS — D125 Benign neoplasm of sigmoid colon: Secondary | ICD-10-CM | POA: Insufficient documentation

## 2021-03-16 DIAGNOSIS — Z953 Presence of xenogenic heart valve: Secondary | ICD-10-CM | POA: Insufficient documentation

## 2021-03-16 DIAGNOSIS — Z7982 Long term (current) use of aspirin: Secondary | ICD-10-CM | POA: Diagnosis not present

## 2021-03-16 DIAGNOSIS — Z79899 Other long term (current) drug therapy: Secondary | ICD-10-CM | POA: Diagnosis not present

## 2021-03-16 DIAGNOSIS — Z1211 Encounter for screening for malignant neoplasm of colon: Secondary | ICD-10-CM | POA: Insufficient documentation

## 2021-03-16 DIAGNOSIS — K219 Gastro-esophageal reflux disease without esophagitis: Secondary | ICD-10-CM | POA: Diagnosis not present

## 2021-03-16 DIAGNOSIS — Z955 Presence of coronary angioplasty implant and graft: Secondary | ICD-10-CM | POA: Diagnosis not present

## 2021-03-16 DIAGNOSIS — Z7984 Long term (current) use of oral hypoglycemic drugs: Secondary | ICD-10-CM | POA: Insufficient documentation

## 2021-03-16 DIAGNOSIS — Z86010 Personal history of colonic polyps: Secondary | ICD-10-CM | POA: Diagnosis not present

## 2021-03-16 HISTORY — DX: Type 2 diabetes mellitus without complications: E11.9

## 2021-03-16 HISTORY — PX: COLONOSCOPY WITH PROPOFOL: SHX5780

## 2021-03-16 LAB — GLUCOSE, CAPILLARY: Glucose-Capillary: 128 mg/dL — ABNORMAL HIGH (ref 70–99)

## 2021-03-16 SURGERY — COLONOSCOPY WITH PROPOFOL
Anesthesia: General

## 2021-03-16 MED ORDER — SODIUM CHLORIDE 0.9 % IV SOLN: INTRAVENOUS | Status: DC

## 2021-03-16 MED ORDER — PROPOFOL 10 MG/ML IV BOLUS
INTRAVENOUS | Status: DC | PRN
  Administered 2021-03-16: 70 mg via INTRAVENOUS

## 2021-03-16 MED ORDER — EPHEDRINE SULFATE 50 MG/ML IJ SOLN
INTRAMUSCULAR | Status: DC | PRN
  Administered 2021-03-16: 10 mg via INTRAVENOUS
  Administered 2021-03-16: 5 mg via INTRAVENOUS

## 2021-03-16 MED ORDER — PROPOFOL 500 MG/50ML IV EMUL
INTRAVENOUS | Status: DC | PRN
  Administered 2021-03-16: 150 ug/kg/min via INTRAVENOUS

## 2021-03-16 MED ORDER — PHENYLEPHRINE HCL (PRESSORS) 10 MG/ML IV SOLN
INTRAVENOUS | Status: DC | PRN
  Administered 2021-03-16 (×2): 100 ug via INTRAVENOUS

## 2021-03-16 MED ORDER — LIDOCAINE HCL (CARDIAC) PF 100 MG/5ML IV SOSY
PREFILLED_SYRINGE | INTRAVENOUS | Status: DC | PRN
  Administered 2021-03-16: 50 mg via INTRAVENOUS

## 2021-03-16 MED ORDER — GLYCOPYRROLATE 0.2 MG/ML IJ SOLN
INTRAMUSCULAR | Status: DC | PRN
  Administered 2021-03-16: .2 mg via INTRAVENOUS

## 2021-03-16 NOTE — Anesthesia Preprocedure Evaluation (Signed)
Anesthesia Evaluation  Patient identified by MRN, date of birth, ID band Patient awake    Reviewed: Allergy & Precautions, NPO status , Patient's Chart, lab work & pertinent test results  History of Anesthesia Complications Negative for: history of anesthetic complications  Airway Mallampati: III  TM Distance: >3 FB Neck ROM: full    Dental  (+) Chipped, Poor Dentition, Missing   Pulmonary neg shortness of breath, former smoker,    Pulmonary exam normal        Cardiovascular Exercise Tolerance: Good hypertension, (-) angina+ CAD, + Past MI and + CABG  Normal cardiovascular exam+ Valvular Problems/Murmurs      Neuro/Psych negative neurological ROS  negative psych ROS   GI/Hepatic negative GI ROS, Neg liver ROS, GERD  Medicated and Controlled,  Endo/Other  diabetes, Type 2  Renal/GU negative Renal ROS  negative genitourinary   Musculoskeletal  (+) Arthritis ,   Abdominal   Peds  Hematology negative hematology ROS (+)   Anesthesia Other Findings Past Medical History: No date: Coronary artery disease No date: Diabetes mellitus without complication (HCC) No date: High cholesterol No date: Hypertension No date: Myocardial infarct, old No date: Plantar fasciitis  Past Surgical History: 03/03/2020: AORTIC VALVE REPLACEMENT; N/A     Comment:  Procedure: AORTIC VALVE REPLACEMENT (AVR) Inspiris 62m;              Surgeon: VIvin Poot MD;  [place]                Service: Open Heart Surgery;  Laterality: N/A; No date: CARDIAC SURGERY No date: CORONARY ANGIOPLASTY WITH STENT PLACEMENT 03/03/2020: CORONARY ARTERY BYPASS GRAFT; N/A     Comment:  Procedure: CORONARY ARTERY BYPASS GRAFTING (CABG) x3.               Endoscopic saphenous vein harvest  ;  Surgeon: VIvin Poot MD;  [place]  Service: Open Heart               Surgery;  Laterality: N/A; 03/03/2020: FLEXIBLE BRONCHOSCOPY;  N/A     Comment:  Procedure: FLEXIBLE BRONCHOSCOPY;  Surgeon: VPrescott Gum               PCollier Salina MD;  [place]  Service: Open Heart               Surgery;  Laterality: N/A; 04/07/2020: IR THORACENTESIS ASP PLEURAL SPACE W/IMG GUIDE 12/31/2017: RIGHT/LEFT HEART CATH AND CORONARY ANGIOGRAPHY; N/A     Comment:  Procedure: RIGHT/LEFT HEART CATH AND CORONARY               ANGIOGRAPHY;  Surgeon: PIsaias Cowman MD;                [place] [facility];  Service: Cardiovascular;              Laterality: N/A; 02/15/2020: RIGHT/LEFT HEART CATH AND CORONARY ANGIOGRAPHY; N/A     Comment:  Procedure: RIGHT/LEFT HEART CATH AND CORONARY               ANGIOGRAPHY;  Surgeon: PIsaias Cowman MD;                [place] [facility];  Service: Cardiovascular;              Laterality: N/A; 03/03/2020: TEE WITHOUT CARDIOVERSION; N/A     Comment:  Procedure:  TRANSESOPHAGEAL ECHOCARDIOGRAM (TEE);                Surgeon: Prescott Gum, Collier Salina, MD;  Location[place];                Service: Open Heart Surgery;  Laterality: N/A;  BMI    Body Mass Index: 33.41 kg/m      Reproductive/Obstetrics negative OB ROS                             Anesthesia Physical Anesthesia Plan  ASA: 3  Anesthesia Plan: General   Post-op Pain Management:    Induction: Intravenous  PONV Risk Score and Plan: Propofol infusion and TIVA  Airway Management Planned: Natural Airway and Nasal Cannula  Additional Equipment:   Intra-op Plan:   Post-operative Plan:   Informed Consent: I have reviewed the patients History and Physical, chart, labs and discussed the procedure including the risks, benefits and alternatives for the proposed anesthesia with the patient or authorized representative who has indicated his/her understanding and acceptance.     Dental Advisory Given  Plan Discussed with: Anesthesiologist, CRNA and Surgeon  Anesthesia Plan Comments: (Patient consented  for risks of anesthesia including but not limited to:  - adverse reactions to medications - risk of airway placement if required - damage to eyes, teeth, lips or other oral mucosa - nerve damage due to positioning  - sore throat or hoarseness - Damage to heart, brain, nerves, lungs, other parts of body or loss of life  Patient voiced understanding.)        Anesthesia Quick Evaluation

## 2021-03-16 NOTE — Transfer of Care (Signed)
Immediate Anesthesia Transfer of Care Note  Patient: Curtis Maxwell  Procedure(s) Performed: COLONOSCOPY WITH PROPOFOL  Patient Location: PACU and Endoscopy Unit  Anesthesia Type:General  Level of Consciousness: drowsy  Airway & Oxygen Therapy: Patient Spontanous Breathing  Post-op Assessment: Report given to RN  Post vital signs: stable  Last Vitals:  Vitals Value Taken Time  BP    Temp    Pulse    Resp    SpO2      Last Pain:  Vitals:   03/16/21 0812  TempSrc: Temporal         Complications: No notable events documented.

## 2021-03-16 NOTE — Op Note (Addendum)
[facility] Patient Name: Curtis Maxwell Procedure Date: 03/16/2021 8:44 AM MRN: [id number] Account #: [id number] Date of Birth: 01-24-1961 Admit Type: Outpatient Age: 60 Room: [facility] ENDO ROOM 1 Gender: Male Note Status: Supervisor Override Procedure:             Colonoscopy Indications:           Personal history of colonic polyps Providers:             Robert Bellow, MD Medicines:             Propofol per Anesthesia Complications:         No immediate complications. Procedure:             Pre-Anesthesia Assessment:                        - Prior to the procedure, a History and Physical was                         performed, and patient medications, allergies and                         sensitivities were reviewed. The patient's tolerance                         of previous anesthesia was reviewed.                        - The risks and benefits of the procedure and the                         sedation options and risks were discussed with the                         patient. All questions were answered and informed                         consent was obtained.                        After obtaining informed consent, the colonoscope was                         passed under direct vision. Throughout the procedure,                         the patient's blood pressure, pulse, and oxygen                         saturations were monitored continuously. The                         Colonoscope was introduced through the anus and                         advanced to the the terminal ileum. The colonoscopy                         was performed without difficulty. The patient  tolerated the procedure well. The quality of the bowel                         preparation was good. Findings:      A 5 mm polyp was found in the sigmoid colon. The polyp was sessile.       Biopsies were taken with a cold forceps for histology.      The  retroflexed view of the distal rectum and anal verge was normal and       showed no anal or rectal abnormalities.      The terminal ileum appeared normal. Impression:            - One 5 mm polyp in the sigmoid colon. Biopsied.                        - The distal rectum and anal verge are normal on                         retroflexion view.                        - The examined portion of the ileum was normal. Recommendation:        - Telephone endoscopist for pathology results in 1                         week. Procedure Code(s):     --- Professional ---                        [phone], Colonoscopy, flexible; with biopsy, single or                         multiple Diagnosis Code(s):     --- Professional ---                        K63.5, Polyp of colon                        Z80.0, Family history of malignant neoplasm of                         digestive organs                        Z12.11, Encounter for screening for malignant neoplasm                         of colon CPT copyright 2019 [employer]. All rights reserved. The codes documented in this report are preliminary and upon coder review may  be revised to meet current compliance requirements. Robert Bellow, MD 03/16/2021 9:25:39 AM This report has been signed electronically. Number of Addenda: 0 Note Initiated On: 03/16/2021 8:44 AM Scope Withdrawal Time: 0 hours 18 minutes 11 seconds  Total Procedure Duration: 0 hours 25 minutes 48 seconds  Estimated Blood Loss:  Estimated blood loss: none.      [facility]

## 2021-03-16 NOTE — Anesthesia Postprocedure Evaluation (Signed)
Anesthesia Post Note  Patient: DELAWRENCE FRIDMAN  Procedure(s) Performed: COLONOSCOPY WITH PROPOFOL  Patient location during evaluation: Endoscopy Anesthesia Type: General Level of consciousness: awake and alert Pain management: pain level controlled Vital Signs Assessment: post-procedure vital signs reviewed and stable Respiratory status: spontaneous breathing, nonlabored ventilation, respiratory function stable and patient connected to nasal cannula oxygen Cardiovascular status: blood pressure returned to baseline and stable Postop Assessment: no apparent nausea or vomiting Anesthetic complications: no   No notable events documented.   Last Vitals:  Vitals:   03/16/21 0940 03/16/21 0950  BP: 122/71 129/71  Pulse: 61 (!) 55  Resp: (!) 21 15  Temp:    SpO2: 99% 100%    Last Pain:  Vitals:   03/16/21 0920  TempSrc: Temporal                 Precious Haws Lasheena Frieze

## 2021-03-16 NOTE — H&P (Signed)
Curtis Maxwell [id number] 1961/07/08     HPI: 60 year old man with a family history of colon cancer and a brother in his 60s.  Previous polyp in 2014, tubular adenoma.  Cardiac surgery with porcine aortic valve placement 2020-2021.  Occasional episodes of chest pain when he walks more than a mile or when dancing.  Nitroglycerin relieves.  No shortness of breath.  The patient reports he tolerated the prep well.  Medications Prior to Admission  Medication Sig Dispense Refill Last Dose   ALPRAZolam (XANAX) 0.5 MG tablet Take 1 tablet (0.5 mg total) by mouth 2 (two) times daily as needed for sleep 60 tablet 0 Past Month   amLODipine (NORVASC) 10 MG tablet Take 1 tablet (10 mg total) by mouth daily. 30 tablet 2 03/16/2021   amLODipine (NORVASC) 10 MG tablet TAKE 1 TABLET BY MOUTH ONCE DAILY 30 tablet 7 03/16/2021   aspirin EC 325 MG EC tablet Take 1 tablet (325 mg total) by mouth daily. 30 tablet 0 Past Week   atorvastatin (LIPITOR) 10 MG tablet TAKE 1 TABLET BY MOUTH ONCE DAILY 30 tablet 6 Past Week   carvedilol (COREG) 3.125 MG tablet Take 1 tablet (3.125 mg total) by mouth 2 (two) times daily with a meal. 60 tablet 1 03/16/2021   carvedilol (COREG) 3.125 MG tablet TAKE 1 TABLET BY MOUTH TWO TIMES DAILY WITH MEALS 180 tablet 0 03/16/2021   colchicine 0.6 MG tablet Take 1 tablet (0.6 mg total) by mouth 2 (two) times daily. 60 tablet 0 Past Month   cyclobenzaprine (FLEXERIL) 10 MG tablet Take 10 mg by mouth 3 (three) times daily as needed for muscle spasms.   Past Week   dapagliflozin propanediol (FARXIGA) 5 MG TABS tablet Take 1 tablet (5 mg total) by mouth once daily 30 tablet 11 Past Week   furosemide (LASIX) 40 MG tablet TAKE 1 TABLET BY MOUTH ONCE DAILY AS NEEDED FOR EDEMA 90 tablet 0 Past Week   lisinopril (ZESTRIL) 40 MG tablet Take 40 mg by mouth daily.   03/16/2021   lisinopril (ZESTRIL) 40 MG tablet TAKE 1 TABLET BY MOUTH ONCE DAILY 90 tablet 1 03/16/2021   metFORMIN (GLUCOPHAGE) 500 MG  tablet TAKE 1 TABLET BY MOUTH DAILY WITH BREAKFAST 90 tablet 0 Past Week   Omega-3 Fatty Acids (FISH OIL) 1200 MG CAPS Take 1,200 mg by mouth 2 (two) times a week.    Past Week   omeprazole (PRILOSEC) 20 MG capsule Take 20-40 mg by mouth daily as needed.    Past Week   pantoprazole (PROTONIX) 20 MG tablet Take 1 tablet (20 mg total) by mouth once daily 90 tablet 1 Past Week   Semaglutide,0.25 or 0.5MG/DOS, (OZEMPIC, 0.25 OR 0.5 MG/DOSE,) 2 MG/1.5ML SOPN Inject 0.25 mg subcutaneously once a week for 30 days, THEN 0.5 mg once a week for 60 days. Follow up to recheck A1c in 3 months 3.75 mL 3 Past Week   traMADol (ULTRAM) 50 MG tablet Take 1 tablet (50 mg total) by mouth every 6 (six) hours as needed. 40 tablet 0 Past Week   acetaminophen (TYLENOL) 325 MG tablet Take 2 tablets (650 mg total) by mouth every 6 (six) hours as needed for mild pain or fever.      amoxicillin (AMOXIL) 500 MG capsule Take 4 capsules (2,000 mg total) by mouth 1 hour prior to dental appointment. 12 capsule 0    atorvastatin (LIPITOR) 10 MG tablet Take 1 tablet (10 mg total) by mouth daily. 30 tablet  1    Cholecalciferol 50 MCG (2000 UT) CAPS Take 2,000 Units by mouth daily.       ferrous sulfate 325 (65 FE) MG tablet Take 1 tablet (325 mg total) by mouth daily with breakfast. For one month then stop. If develops constipation, may stop sooner 30 tablet 3    metFORMIN (GLUCOPHAGE) 500 MG tablet Take 500 mg by mouth daily.       nitroGLYCERIN (NITROSTAT) 0.4 MG SL tablet PLACE 1 TABLET UNDER TONGUE AS NEEDED FOR CHEST PAIN;MAY REPEAT EVERY 5 MIN X 3, IF CHEST PAIN CONTINUES, CALL 911 25 tablet 1    sildenafil (REVATIO) 20 MG tablet Take 60-80 mg by mouth daily as needed (erectile dysfuntion.).       No Known Allergies Past Medical History:  Diagnosis Date   Coronary artery disease    Diabetes mellitus without complication (HCC)    High cholesterol    Hypertension    Myocardial infarct, old    Plantar fasciitis    Past  Surgical History:  Procedure Laterality Date   AORTIC VALVE REPLACEMENT N/A 03/03/2020   Procedure: AORTIC VALVE REPLACEMENT (AVR) Inspiris 76m;  Surgeon: VIvin Poot MD;  [facility]  Service: Open Heart Surgery;  Laterality: N/A;   CARDIAC SURGERY     CORONARY ANGIOPLASTY WITH STENT PLACEMENT     CORONARY ARTERY BYPASS GRAFT N/A 03/03/2020   Procedure: CORONARY ARTERY BYPASS GRAFTING (CABG) x3. Endoscopic saphenous vein harvest  ;  Surgeon: VIvin Poot MD;  [place] [facility]  Service: Open Heart Surgery;  Laterality: N/A;   FLEXIBLE BRONCHOSCOPY N/A 03/03/2020   Procedure: FLEXIBLE BRONCHOSCOPY;  Surgeon: VIvin Poot MD;  [place] [facility]  Service: Open Heart Surgery;  Laterality: N/A;   IR THORACENTESIS ASP PLEURAL SPACE W/IMG GUIDE  04/07/2020   RIGHT/LEFT HEART CATH AND CORONARY ANGIOGRAPHY N/A 12/31/2017   Procedure: RIGHT/LEFT HEART CATH AND CORONARY ANGIOGRAPHY;  Surgeon: PIsaias Cowman MD;  [facility];  Service: Cardiovascular;  Laterality: N/A;   RIGHT/LEFT HEART CATH AND CORONARY ANGIOGRAPHY N/A 02/15/2020   Procedure: RIGHT/LEFT HEART CATH AND CORONARY ANGIOGRAPHY;  Surgeon: PIsaias Cowman MD;  [facility];  Service: Cardiovascular;  Laterality: N/A;   TEE WITHOUT CARDIOVERSION N/A 03/03/2020   Procedure: TRANSESOPHAGEAL ECHOCARDIOGRAM (TEE);  Surgeon: VPrescott Gum PCollier Salina MD;  [place] [facility]  Service: Open Heart Surgery;  Laterality: N/A;   Social History   Socioeconomic History   Marital status: Married    Spouse name: CBarista  Number of children: 2   Years of education: Not on file   Highest education level: Not on file  Occupational History   Not on file  Tobacco Use   Smoking status: Former    Packs/day: 1.00    Years: 15.00    Pack years: 15.00    Types: E-cigarettes, Cigarettes    Quit date: 09/06/1998    Years since quitting: 22.5   Smokeless tobacco: Never   Tobacco comments:    stopped smoking  vapes 2 months ago  Vaping Use   Vaping Use: Former  Substance and Sexual Activity   Alcohol use: Yes    Alcohol/week: 10.0 standard drinks    Types: 10 Cans of beer per week   Drug use: Never   Sexual activity: Yes    Partners: Female  Other Topics Concern   Not on file  Social History Narrative   Not on file   Social Determinants of Health  Financial Resource Strain: Not on file  Food Insecurity: Not on file  Transportation Needs: Not on file  Physical Activity: Not on file  Stress: Not on file  Social Connections: Not on file  Intimate Partner Violence: Not on file   Social History   Social History Narrative   Not on file     ROS: Negative.     PE: HEENT: Negative. Lungs: Clear. Cardio: RR.    Assessment/Plan:  Proceed with planned endoscopy.  Forest Gleason [facility] 03/16/2021

## 2021-03-19 ENCOUNTER — Encounter: Payer: Self-pay | Admitting: General Surgery

## 2021-03-19 LAB — SURGICAL PATHOLOGY

## 2021-03-29 ENCOUNTER — Other Ambulatory Visit: Payer: Self-pay

## 2021-04-02 ENCOUNTER — Other Ambulatory Visit: Payer: Self-pay

## 2021-04-11 ENCOUNTER — Encounter: Payer: Self-pay | Admitting: General Surgery

## 2021-04-20 ENCOUNTER — Other Ambulatory Visit: Payer: Self-pay

## 2021-04-20 MED ORDER — CARVEDILOL 3.125 MG PO TABS
ORAL_TABLET | Freq: Two times a day (BID) | ORAL | 0 refills | Status: DC
  Filled 2021-04-20: qty 180, 90d supply, fill #0

## 2021-04-23 ENCOUNTER — Other Ambulatory Visit: Payer: Self-pay

## 2021-04-23 MED ORDER — ATORVASTATIN CALCIUM 10 MG PO TABS
10.0000 mg | ORAL_TABLET | Freq: Every day | ORAL | 0 refills | Status: DC
  Filled 2021-04-23: qty 90, 90d supply, fill #0

## 2021-04-23 MED ORDER — METFORMIN HCL 500 MG PO TABS
500.0000 mg | ORAL_TABLET | Freq: Every day | ORAL | 0 refills | Status: DC
  Filled 2021-04-23: qty 90, 90d supply, fill #0

## 2021-04-25 ENCOUNTER — Other Ambulatory Visit: Payer: Self-pay

## 2021-05-09 ENCOUNTER — Other Ambulatory Visit: Payer: Self-pay

## 2021-05-09 MED ORDER — LISINOPRIL 40 MG PO TABS
ORAL_TABLET | Freq: Every day | ORAL | 0 refills | Status: DC
  Filled 2021-05-09: qty 90, 90d supply, fill #0

## 2021-05-09 MED ORDER — FUROSEMIDE 40 MG PO TABS
ORAL_TABLET | ORAL | 0 refills | Status: DC
  Filled 2021-05-09: qty 90, 90d supply, fill #0

## 2021-05-14 ENCOUNTER — Other Ambulatory Visit: Payer: Self-pay

## 2021-05-14 MED ORDER — CARESTART COVID-19 HOME TEST VI KIT
PACK | 0 refills | Status: DC
  Filled 2021-05-14: qty 2, 4d supply, fill #0

## 2021-05-15 ENCOUNTER — Other Ambulatory Visit: Payer: Self-pay

## 2021-06-04 ENCOUNTER — Other Ambulatory Visit: Payer: Self-pay

## 2021-06-07 ENCOUNTER — Other Ambulatory Visit: Payer: Self-pay

## 2021-06-07 DIAGNOSIS — K219 Gastro-esophageal reflux disease without esophagitis: Secondary | ICD-10-CM | POA: Diagnosis not present

## 2021-06-07 DIAGNOSIS — I10 Essential (primary) hypertension: Secondary | ICD-10-CM | POA: Diagnosis not present

## 2021-06-07 DIAGNOSIS — E538 Deficiency of other specified B group vitamins: Secondary | ICD-10-CM | POA: Diagnosis not present

## 2021-06-07 DIAGNOSIS — Z125 Encounter for screening for malignant neoplasm of prostate: Secondary | ICD-10-CM | POA: Diagnosis not present

## 2021-06-07 DIAGNOSIS — E119 Type 2 diabetes mellitus without complications: Secondary | ICD-10-CM | POA: Diagnosis not present

## 2021-06-07 DIAGNOSIS — Z23 Encounter for immunization: Secondary | ICD-10-CM | POA: Diagnosis not present

## 2021-06-07 DIAGNOSIS — Z1329 Encounter for screening for other suspected endocrine disorder: Secondary | ICD-10-CM | POA: Diagnosis not present

## 2021-06-07 DIAGNOSIS — E7800 Pure hypercholesterolemia, unspecified: Secondary | ICD-10-CM | POA: Diagnosis not present

## 2021-06-07 DIAGNOSIS — N529 Male erectile dysfunction, unspecified: Secondary | ICD-10-CM | POA: Diagnosis not present

## 2021-06-07 MED ORDER — AMLODIPINE BESYLATE 10 MG PO TABS
ORAL_TABLET | ORAL | 1 refills | Status: DC
  Filled 2021-06-07 – 2021-08-28 (×2): qty 90, 90d supply, fill #0
  Filled 2021-11-20: qty 90, 90d supply, fill #1

## 2021-06-07 MED ORDER — METFORMIN HCL 500 MG PO TABS
500.0000 mg | ORAL_TABLET | Freq: Every day | ORAL | 1 refills | Status: DC
  Filled 2021-07-20: qty 90, 90d supply, fill #0
  Filled 2021-10-17: qty 90, 90d supply, fill #1

## 2021-06-07 MED ORDER — OMEPRAZOLE 20 MG PO CPDR
DELAYED_RELEASE_CAPSULE | ORAL | 3 refills | Status: AC
  Filled 2021-06-07: qty 180, 90d supply, fill #0
  Filled 2021-08-28: qty 180, 90d supply, fill #1
  Filled 2021-11-28: qty 180, 90d supply, fill #2
  Filled 2022-03-08: qty 180, 90d supply, fill #3

## 2021-06-07 MED ORDER — LISINOPRIL 40 MG PO TABS
ORAL_TABLET | ORAL | 1 refills | Status: DC
  Filled 2021-07-20: qty 90, 90d supply, fill #0
  Filled 2021-10-17: qty 90, 90d supply, fill #1

## 2021-06-07 MED ORDER — ATORVASTATIN CALCIUM 10 MG PO TABS
10.0000 mg | ORAL_TABLET | Freq: Every day | ORAL | 1 refills | Status: DC
  Filled 2021-06-07: qty 90, 90d supply, fill #0

## 2021-06-07 MED ORDER — NITROGLYCERIN 0.4 MG SL SUBL
SUBLINGUAL_TABLET | SUBLINGUAL | 1 refills | Status: DC
  Filled 2021-06-07: qty 25, 7d supply, fill #0
  Filled 2021-08-28: qty 25, 7d supply, fill #1

## 2021-06-07 MED ORDER — CARVEDILOL 3.125 MG PO TABS
ORAL_TABLET | ORAL | 1 refills | Status: DC
  Filled 2021-07-20: qty 180, 90d supply, fill #0
  Filled 2021-11-20: qty 180, 90d supply, fill #1

## 2021-06-07 MED ORDER — FUROSEMIDE 40 MG PO TABS
ORAL_TABLET | ORAL | 1 refills | Status: DC
  Filled 2021-07-20: qty 90, 90d supply, fill #0
  Filled 2021-10-17: qty 90, 90d supply, fill #1

## 2021-06-13 ENCOUNTER — Other Ambulatory Visit: Payer: Self-pay

## 2021-06-13 MED ORDER — FARXIGA 10 MG PO TABS
10.0000 mg | ORAL_TABLET | Freq: Every day | ORAL | 1 refills | Status: DC
  Filled 2021-06-13 – 2021-06-26 (×3): qty 90, 90d supply, fill #0
  Filled 2021-09-19: qty 90, 90d supply, fill #1

## 2021-06-13 MED ORDER — ATORVASTATIN CALCIUM 20 MG PO TABS
ORAL_TABLET | ORAL | 1 refills | Status: DC
  Filled 2021-06-13: qty 90, 90d supply, fill #0
  Filled 2021-11-20: qty 90, 90d supply, fill #1

## 2021-06-19 ENCOUNTER — Other Ambulatory Visit: Payer: Self-pay

## 2021-06-26 ENCOUNTER — Other Ambulatory Visit: Payer: Self-pay

## 2021-07-10 DIAGNOSIS — I10 Essential (primary) hypertension: Secondary | ICD-10-CM | POA: Diagnosis not present

## 2021-07-10 DIAGNOSIS — Z951 Presence of aortocoronary bypass graft: Secondary | ICD-10-CM | POA: Diagnosis not present

## 2021-07-10 DIAGNOSIS — Z952 Presence of prosthetic heart valve: Secondary | ICD-10-CM | POA: Diagnosis not present

## 2021-07-10 DIAGNOSIS — I25810 Atherosclerosis of coronary artery bypass graft(s) without angina pectoris: Secondary | ICD-10-CM | POA: Diagnosis not present

## 2021-07-20 ENCOUNTER — Other Ambulatory Visit: Payer: Self-pay

## 2021-07-20 MED ORDER — AMOXICILLIN 500 MG PO CAPS
ORAL_CAPSULE | ORAL | 0 refills | Status: DC
  Filled 2021-10-17: qty 12, 3d supply, fill #0

## 2021-08-28 ENCOUNTER — Other Ambulatory Visit: Payer: Self-pay

## 2021-08-29 ENCOUNTER — Other Ambulatory Visit: Payer: Self-pay

## 2021-09-06 ENCOUNTER — Other Ambulatory Visit: Payer: Self-pay

## 2021-09-07 ENCOUNTER — Other Ambulatory Visit: Payer: Self-pay

## 2021-09-07 MED ORDER — CARESTART COVID-19 HOME TEST VI KIT
PACK | 0 refills | Status: DC
  Filled 2021-09-07: qty 2, 4d supply, fill #0

## 2021-09-19 ENCOUNTER — Other Ambulatory Visit: Payer: Self-pay

## 2021-10-08 DIAGNOSIS — L811 Chloasma: Secondary | ICD-10-CM | POA: Diagnosis not present

## 2021-10-17 ENCOUNTER — Other Ambulatory Visit: Payer: Self-pay

## 2021-10-21 IMAGING — DX DG CHEST 1V PORT
1 series · 1 of 1 positions shown · non-contrast
Comparison: 03/03/2020

CLINICAL DATA: Postop aortic valve replacement

EXAM:
PORTABLE CHEST 1 VIEW

[chest]
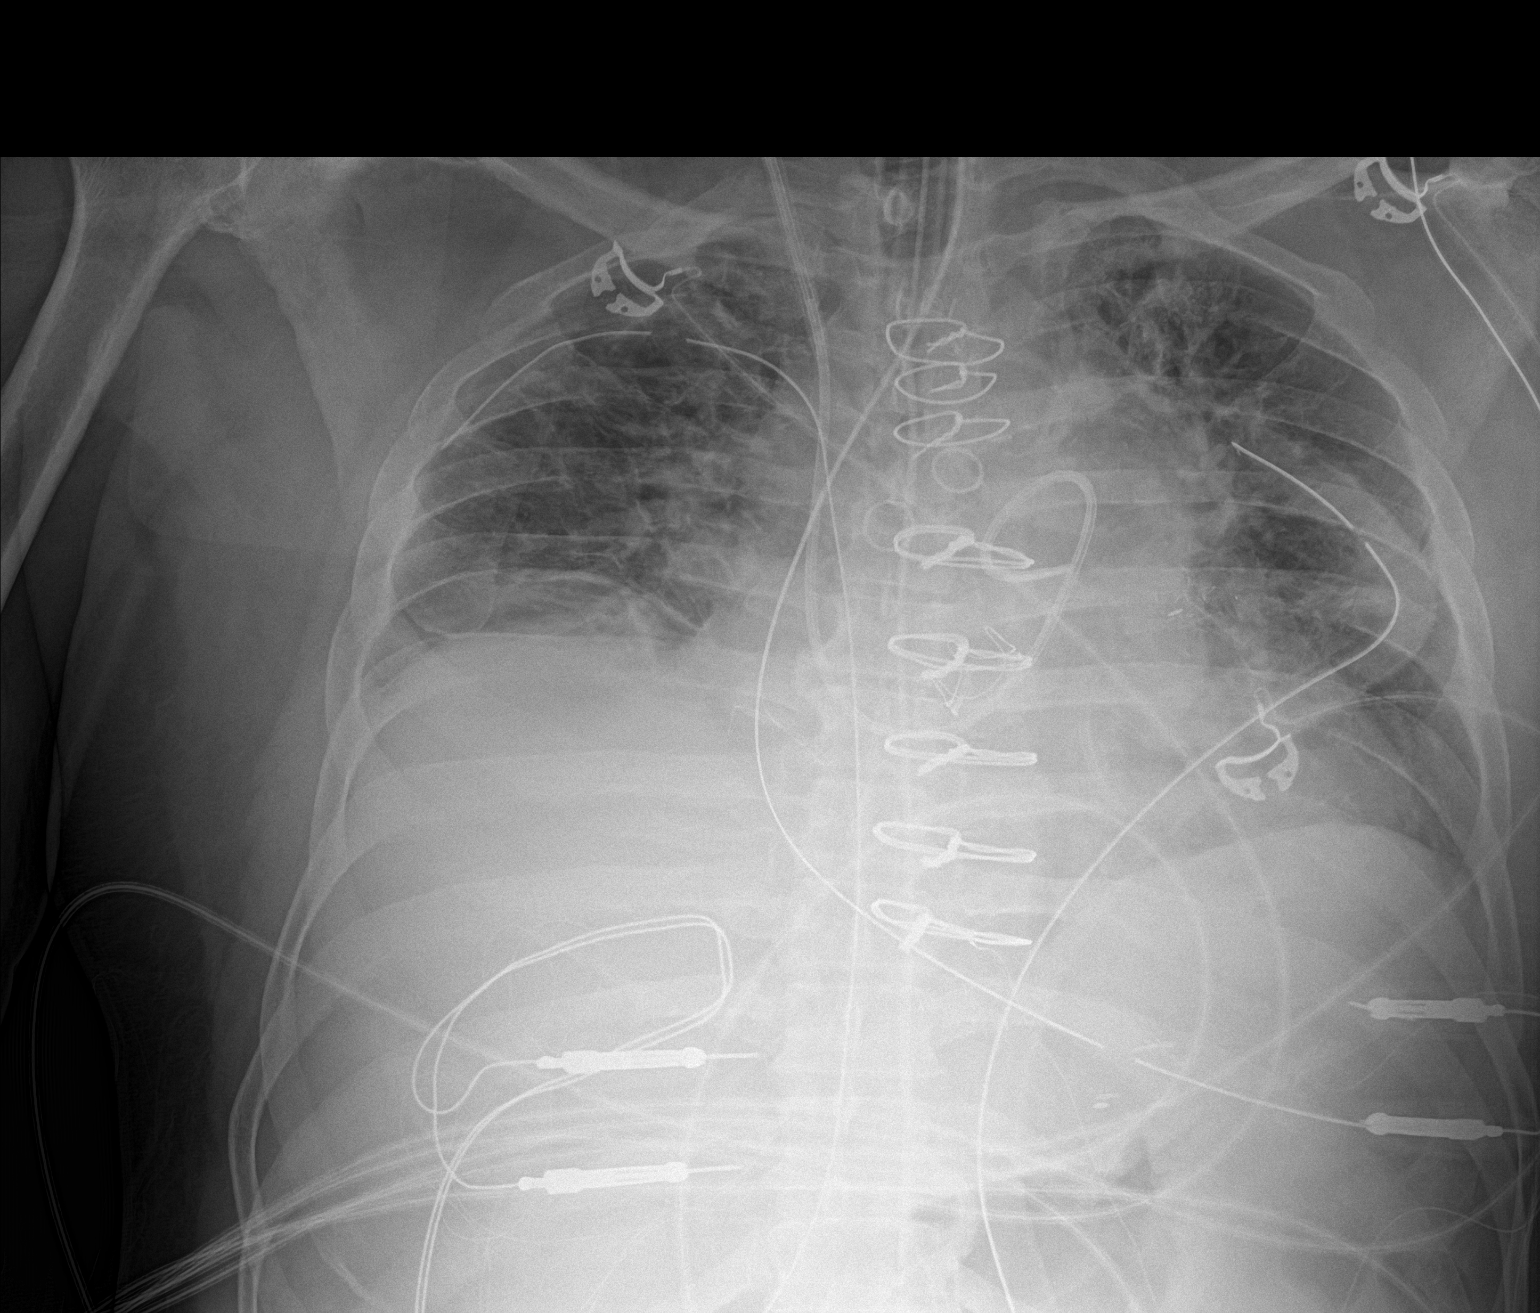

[1 of 1 positions shown; findings below may reference images not displayed]

FINDINGS: Bilateral chest tubes, NG tube, endotracheal tube remain in place,
unchanged. Swan-Ganz catheter continues to loop in the left
pulmonary artery. Cardiomegaly. Diffuse bilateral airspace
opacities. Elevation of the right hemidiaphragm with right base
atelectasis. No visible pneumothorax.
IMPRESSION: No significant change since prior study.

## 2021-10-21 IMAGING — CR DG CHEST 1V PORT
2 series · 2 of 2 positions shown · non-contrast
Comparison: Chest x-ray yesterday.

CLINICAL DATA: Status post CABG and AVR.

EXAM:
PORTABLE CHEST 1 VIEW

[AP (1 of 2)]
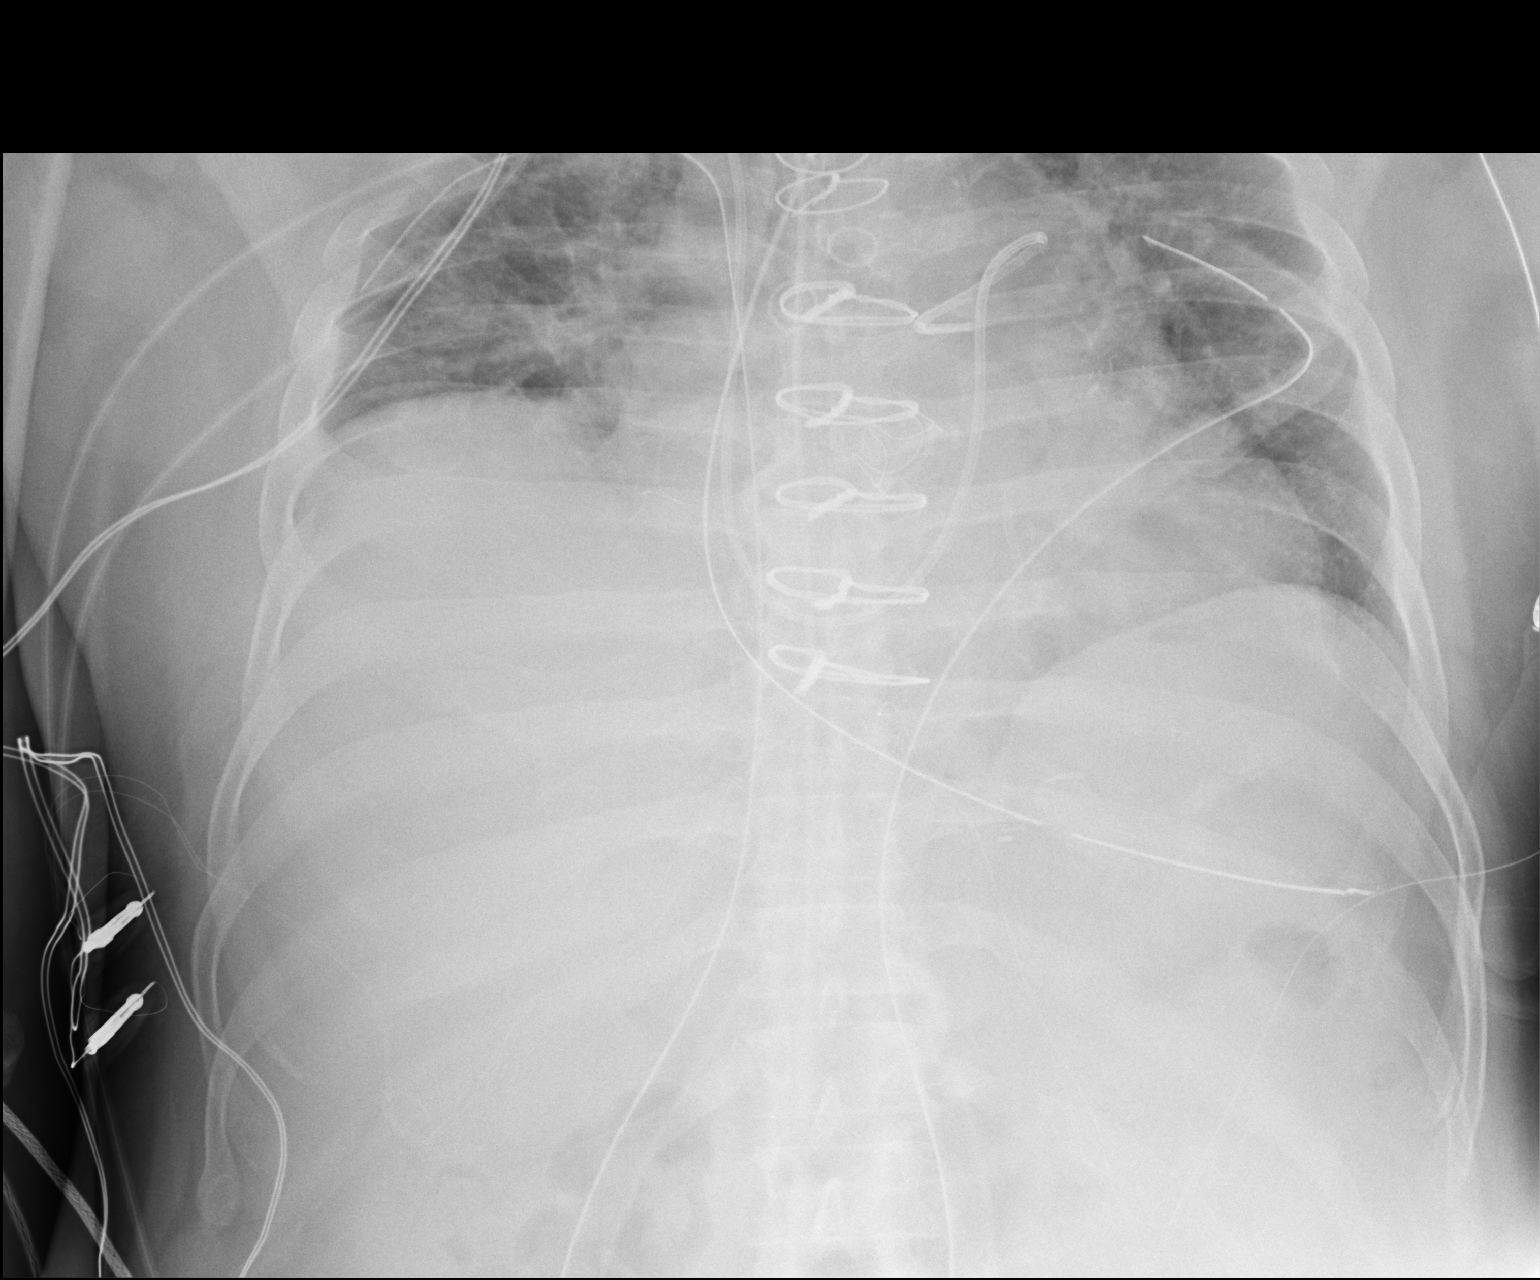

[AP (2 of 2)]
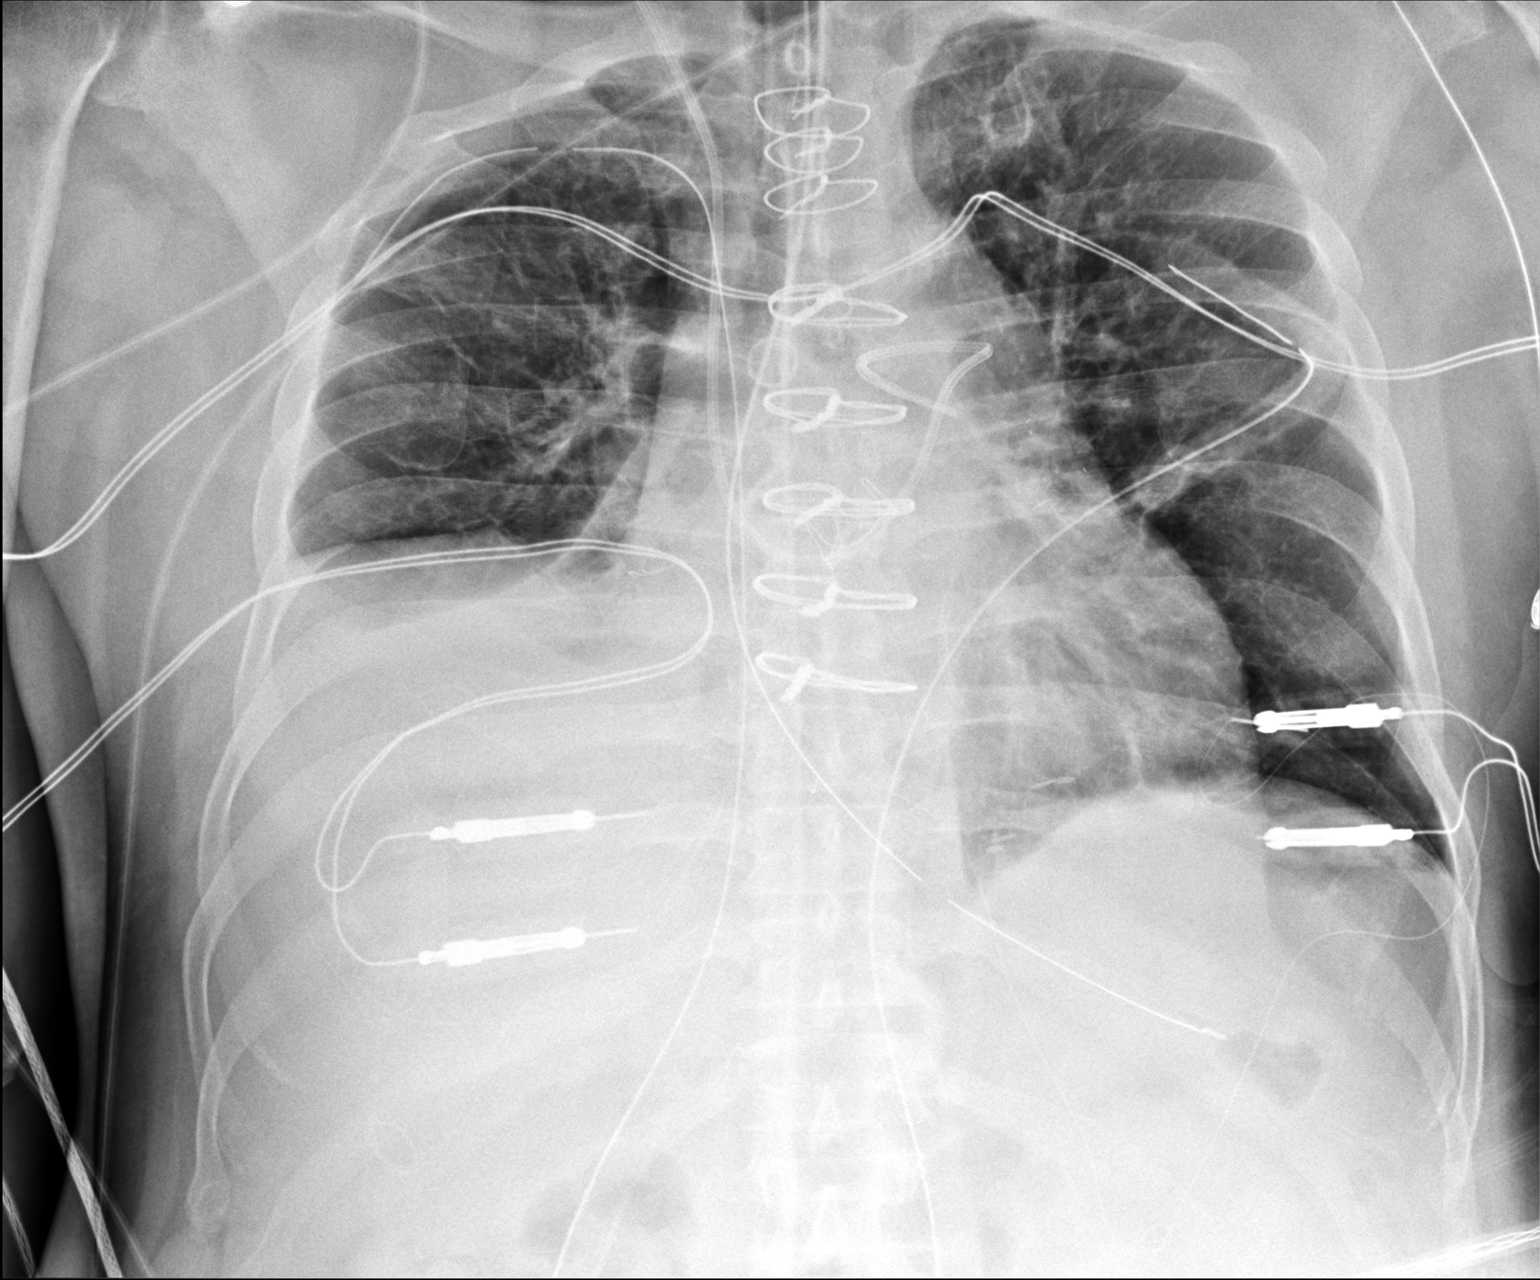

[2 of 2 positions shown; findings below may reference images not displayed]

FINDINGS: Endotracheal tube in position with the tip 3.3 cm above the level of
the carina. Enteric tube tip in the stomach. Right internal jugular
Swan-Ganz catheter with the tip coiled in the main pulmonary outflow
tract. Bilateral chest tubes are in good position.

Interval CABG and AVR. Normal heart size. Small right pleural
effusion with right greater than left basilar atelectasis. No
pneumothorax. Elevated right hemidiaphragm. No acute osseous
abnormality.
IMPRESSION: 1. Status post CABG with appropriately positioned lines and tubes.
The Swan-Ganz catheter tip is coiled in the main pulmonary outflow
tract.
2. No pneumothorax.
3. Small right pleural effusion with right greater than left basilar
atelectasis.

## 2021-10-22 ENCOUNTER — Other Ambulatory Visit: Payer: Self-pay

## 2021-10-22 ENCOUNTER — Other Ambulatory Visit: Payer: Self-pay | Admitting: Gerontology

## 2021-10-22 IMAGING — DX DG CHEST 1V PORT
1 series · 1 of 1 positions shown · non-contrast
Comparison: Chest x-ray 03/03/2020.

CLINICAL DATA: 58-year-old male status post CABG.

EXAM:
PORTABLE CHEST 1 VIEW

[chest ap]
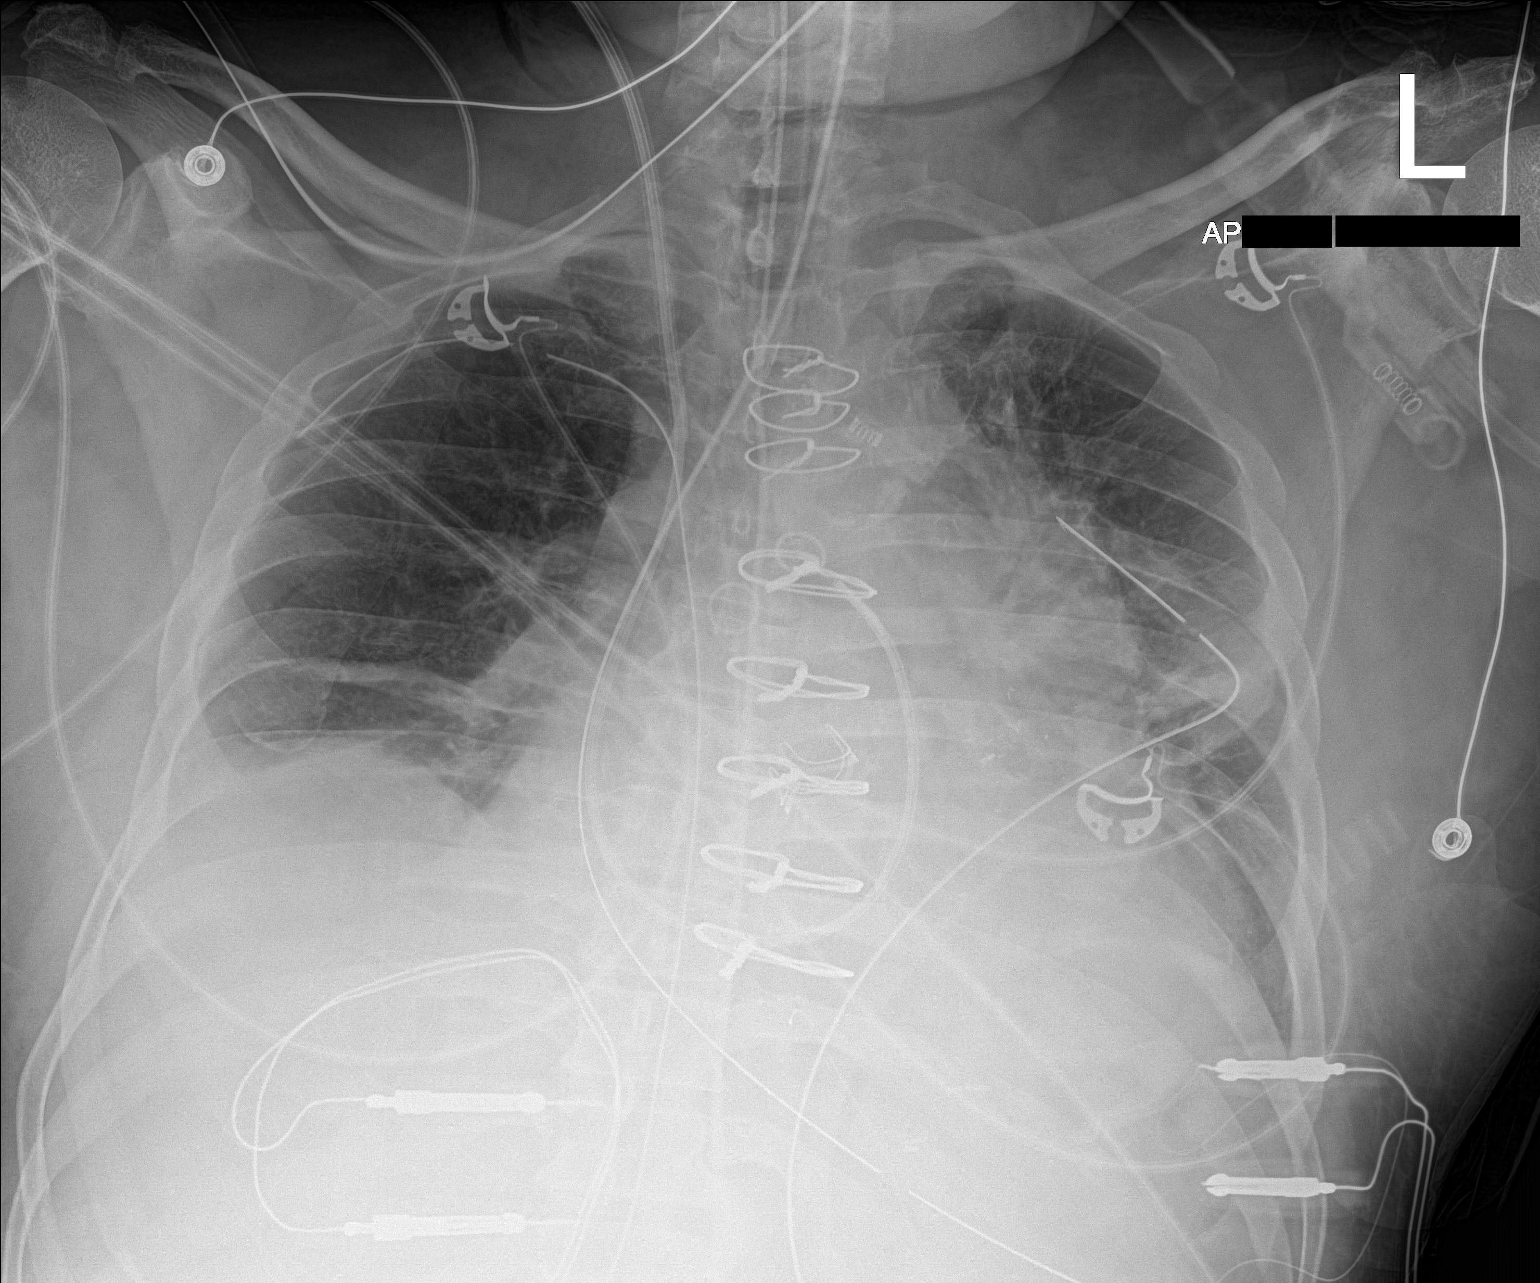

[1 of 1 positions shown; findings below may reference images not displayed]

FINDINGS: Bilateral chest tubes appear stable in position. Midline mediastinal
drain. Patient is intubated, with the tip of the endotracheal tube
is obscured by adjacent tubing. Right IJ Cordis through which a
Swan-Ganz catheter has been passed into the distal pulmonic trunk. A
nasogastric tube is seen extending into the stomach, however, the
tip of the nasogastric tube extends below the lower margin of the
image. Low lung volumes with bibasilar opacities which likely
reflect areas of postoperative atelectasis. Trace bilateral pleural
effusions. No appreciable pneumothorax. Mild enlargement of the
cardiopericardial silhouette, similar to the prior study. Upper
mediastinal contours are distorted. Status post median sternotomy
for CABG and aortic valve replacement.
IMPRESSION: 1. Postoperative changes and support apparatus, as above.
2. Low lung volumes with bibasilar areas of postoperative
subsegmental atelectasis and small bilateral pleural effusions.

## 2021-10-22 MED ORDER — ALPRAZOLAM 0.5 MG PO TABS
ORAL_TABLET | ORAL | 0 refills | Status: DC
  Filled 2021-10-22: qty 60, 30d supply, fill #0

## 2021-10-25 IMAGING — DX DG CHEST 1V PORT
1 series · 1 of 1 positions shown · non-contrast
Comparison: 03/06/2020.

CLINICAL DATA: Status post AVR.

EXAM:
PORTABLE CHEST 1 VIEW

[chest ap]
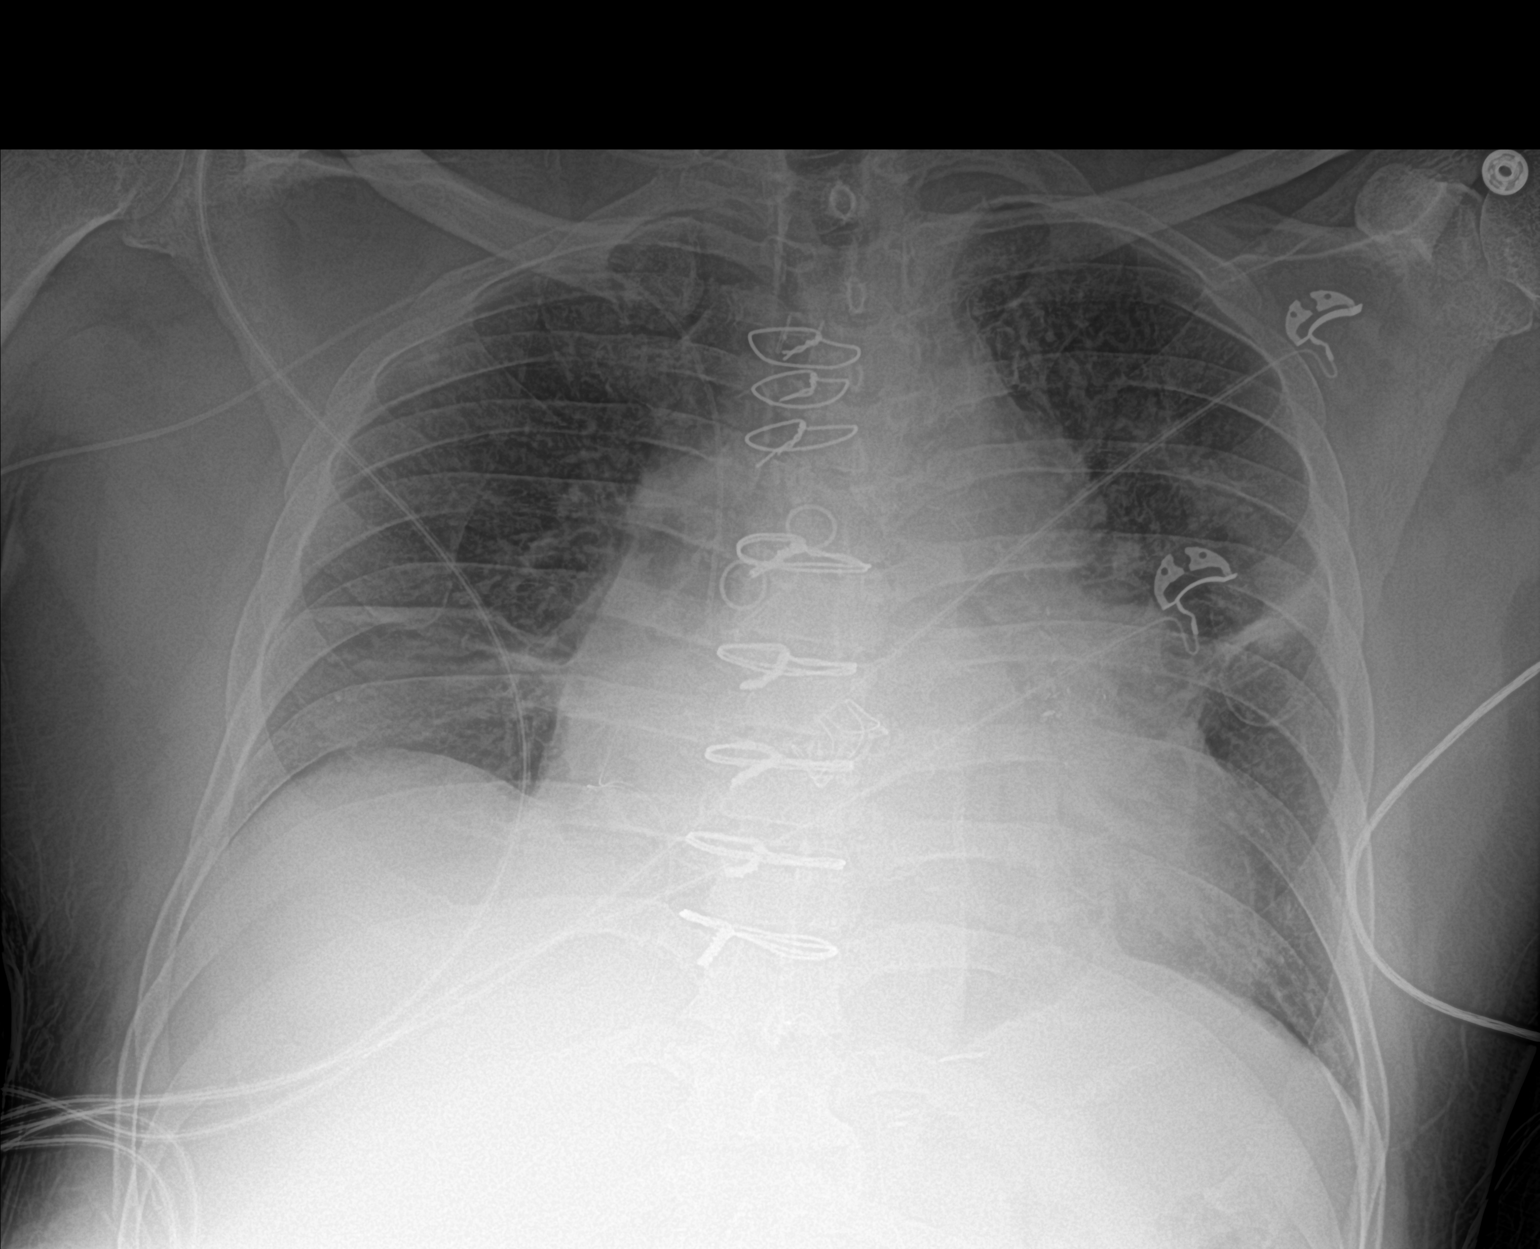

[1 of 1 positions shown; findings below may reference images not displayed]

FINDINGS: Interim removal of bilateral chest tubes. Interval removal of
mediastinal drainage catheter. Right PICC line in stable position.
Prior CABG and cardiac valve replacement. Stable cardiomegaly.
Stable bilateral subsegmental atelectasis. No prominent pleural
effusion. No pneumothorax.
IMPRESSION: 1. Interim removal of bilateral chest tubes. Interim removal of
mediastinal drainage catheter. Right PICC line in stable position.
No evidence of pneumothorax.

2.  Prior CABG and cardiac valve replacement.  Stable cardiomegaly.

3.  Stable bilateral subsegmental atelectasis

## 2021-11-08 ENCOUNTER — Other Ambulatory Visit: Payer: Self-pay

## 2021-11-08 DIAGNOSIS — I10 Essential (primary) hypertension: Secondary | ICD-10-CM | POA: Diagnosis not present

## 2021-11-08 DIAGNOSIS — I25810 Atherosclerosis of coronary artery bypass graft(s) without angina pectoris: Secondary | ICD-10-CM | POA: Diagnosis not present

## 2021-11-08 DIAGNOSIS — Z9889 Other specified postprocedural states: Secondary | ICD-10-CM | POA: Diagnosis not present

## 2021-11-08 DIAGNOSIS — I25119 Atherosclerotic heart disease of native coronary artery with unspecified angina pectoris: Secondary | ICD-10-CM | POA: Diagnosis not present

## 2021-11-08 MED ORDER — NITROGLYCERIN 0.4 MG SL SUBL
SUBLINGUAL_TABLET | SUBLINGUAL | 1 refills | Status: DC
  Filled 2021-11-08: qty 25, 8d supply, fill #0
  Filled 2021-11-20: qty 25, 8d supply, fill #1

## 2021-11-20 ENCOUNTER — Other Ambulatory Visit: Payer: Self-pay

## 2021-11-21 ENCOUNTER — Other Ambulatory Visit: Payer: Self-pay

## 2021-11-28 ENCOUNTER — Other Ambulatory Visit: Payer: Self-pay

## 2021-11-29 DIAGNOSIS — Z952 Presence of prosthetic heart valve: Secondary | ICD-10-CM | POA: Diagnosis not present

## 2021-11-29 DIAGNOSIS — Z951 Presence of aortocoronary bypass graft: Secondary | ICD-10-CM | POA: Diagnosis not present

## 2021-11-29 DIAGNOSIS — I25810 Atherosclerosis of coronary artery bypass graft(s) without angina pectoris: Secondary | ICD-10-CM | POA: Diagnosis not present

## 2021-12-10 DIAGNOSIS — L811 Chloasma: Secondary | ICD-10-CM | POA: Diagnosis not present

## 2021-12-14 ENCOUNTER — Other Ambulatory Visit: Payer: Self-pay

## 2021-12-14 DIAGNOSIS — E119 Type 2 diabetes mellitus without complications: Secondary | ICD-10-CM | POA: Diagnosis not present

## 2021-12-14 DIAGNOSIS — I25119 Atherosclerotic heart disease of native coronary artery with unspecified angina pectoris: Secondary | ICD-10-CM | POA: Diagnosis not present

## 2021-12-14 DIAGNOSIS — Z0000 Encounter for general adult medical examination without abnormal findings: Secondary | ICD-10-CM | POA: Diagnosis not present

## 2021-12-14 DIAGNOSIS — I10 Essential (primary) hypertension: Secondary | ICD-10-CM | POA: Diagnosis not present

## 2021-12-14 MED ORDER — ATORVASTATIN CALCIUM 20 MG PO TABS
ORAL_TABLET | ORAL | 1 refills | Status: DC
  Filled 2021-12-14 – 2022-02-22 (×2): qty 90, 90d supply, fill #0
  Filled 2022-05-22: qty 90, 90d supply, fill #1

## 2021-12-14 MED ORDER — FARXIGA 10 MG PO TABS
10.0000 mg | ORAL_TABLET | Freq: Every day | ORAL | 1 refills | Status: DC
  Filled 2021-12-14 – 2021-12-25 (×2): qty 90, 90d supply, fill #0
  Filled 2022-03-04: qty 90, 90d supply, fill #1

## 2021-12-14 MED ORDER — AMLODIPINE BESYLATE 10 MG PO TABS
10.0000 mg | ORAL_TABLET | Freq: Every day | ORAL | 1 refills | Status: DC
  Filled 2021-12-14 – 2022-02-22 (×2): qty 90, 90d supply, fill #0
  Filled 2022-05-22: qty 90, 90d supply, fill #1

## 2021-12-14 MED ORDER — FUROSEMIDE 40 MG PO TABS
ORAL_TABLET | ORAL | 1 refills | Status: DC
  Filled 2021-12-14 – 2021-12-25 (×2): qty 90, 90d supply, fill #0
  Filled 2022-04-16: qty 90, 90d supply, fill #1

## 2021-12-14 MED ORDER — CARVEDILOL 3.125 MG PO TABS
ORAL_TABLET | ORAL | 1 refills | Status: DC
Start: 2021-12-14 — End: 2022-06-19
  Filled 2021-12-14 – 2022-02-22 (×2): qty 180, 90d supply, fill #0
  Filled 2022-05-22: qty 180, 90d supply, fill #1

## 2021-12-14 MED ORDER — LISINOPRIL 40 MG PO TABS
ORAL_TABLET | ORAL | 1 refills | Status: DC
  Filled 2021-12-14 – 2022-01-29 (×2): qty 90, 90d supply, fill #0
  Filled 2022-05-01: qty 90, 90d supply, fill #1

## 2021-12-14 MED ORDER — METFORMIN HCL 500 MG PO TABS
500.0000 mg | ORAL_TABLET | Freq: Every day | ORAL | 1 refills | Status: DC
  Filled 2021-12-14 – 2021-12-25 (×2): qty 90, 90d supply, fill #0
  Filled 2022-04-16: qty 90, 90d supply, fill #1

## 2021-12-25 ENCOUNTER — Other Ambulatory Visit: Payer: Self-pay

## 2021-12-26 ENCOUNTER — Other Ambulatory Visit: Payer: Self-pay

## 2021-12-31 ENCOUNTER — Other Ambulatory Visit: Payer: Self-pay

## 2022-01-07 ENCOUNTER — Other Ambulatory Visit: Payer: Self-pay

## 2022-01-29 ENCOUNTER — Other Ambulatory Visit: Payer: Self-pay

## 2022-01-31 ENCOUNTER — Other Ambulatory Visit: Payer: Self-pay

## 2022-02-04 ENCOUNTER — Other Ambulatory Visit: Payer: Self-pay

## 2022-02-04 DIAGNOSIS — R2232 Localized swelling, mass and lump, left upper limb: Secondary | ICD-10-CM | POA: Diagnosis not present

## 2022-02-04 MED ORDER — PREDNISONE 10 MG PO TABS
ORAL_TABLET | ORAL | 0 refills | Status: DC
  Filled 2022-02-04: qty 21, 6d supply, fill #0

## 2022-02-04 MED ORDER — COLCHICINE 0.6 MG PO TABS
0.6000 mg | ORAL_TABLET | Freq: Every day | ORAL | 0 refills | Status: DC
  Filled 2022-02-04 (×2): qty 30, 30d supply, fill #0

## 2022-02-22 ENCOUNTER — Other Ambulatory Visit: Payer: Self-pay

## 2022-02-26 ENCOUNTER — Other Ambulatory Visit: Payer: Self-pay

## 2022-02-26 MED ORDER — ALPRAZOLAM 0.5 MG PO TABS
ORAL_TABLET | ORAL | 0 refills | Status: DC
  Filled 2022-02-26: qty 60, 30d supply, fill #0

## 2022-02-26 MED ORDER — NITROGLYCERIN 0.4 MG SL SUBL
SUBLINGUAL_TABLET | SUBLINGUAL | 1 refills | Status: DC
  Filled 2022-02-26: qty 25, 5d supply, fill #0
  Filled 2022-04-16: qty 25, 5d supply, fill #1

## 2022-02-27 ENCOUNTER — Other Ambulatory Visit: Payer: Self-pay

## 2022-03-04 ENCOUNTER — Other Ambulatory Visit: Payer: Self-pay

## 2022-03-08 ENCOUNTER — Other Ambulatory Visit: Payer: Self-pay

## 2022-04-10 DIAGNOSIS — E119 Type 2 diabetes mellitus without complications: Secondary | ICD-10-CM | POA: Diagnosis not present

## 2022-04-10 DIAGNOSIS — Z952 Presence of prosthetic heart valve: Secondary | ICD-10-CM | POA: Diagnosis not present

## 2022-04-10 DIAGNOSIS — Z951 Presence of aortocoronary bypass graft: Secondary | ICD-10-CM | POA: Diagnosis not present

## 2022-04-10 DIAGNOSIS — I25119 Atherosclerotic heart disease of native coronary artery with unspecified angina pectoris: Secondary | ICD-10-CM | POA: Diagnosis not present

## 2022-04-10 DIAGNOSIS — Z9889 Other specified postprocedural states: Secondary | ICD-10-CM | POA: Diagnosis not present

## 2022-04-10 DIAGNOSIS — E7800 Pure hypercholesterolemia, unspecified: Secondary | ICD-10-CM | POA: Diagnosis not present

## 2022-04-10 DIAGNOSIS — I10 Essential (primary) hypertension: Secondary | ICD-10-CM | POA: Diagnosis not present

## 2022-04-11 DIAGNOSIS — L811 Chloasma: Secondary | ICD-10-CM | POA: Diagnosis not present

## 2022-04-16 ENCOUNTER — Other Ambulatory Visit: Payer: Self-pay

## 2022-04-24 ENCOUNTER — Other Ambulatory Visit: Payer: Self-pay

## 2022-05-01 ENCOUNTER — Other Ambulatory Visit: Payer: Self-pay

## 2022-05-02 ENCOUNTER — Other Ambulatory Visit: Payer: Self-pay

## 2022-05-02 MED ORDER — COLCHICINE 0.6 MG PO TABS
ORAL_TABLET | ORAL | 0 refills | Status: DC
  Filled 2022-05-02: qty 20, 10d supply, fill #0

## 2022-05-02 MED ORDER — ALPRAZOLAM 0.5 MG PO TABS
ORAL_TABLET | ORAL | 0 refills | Status: DC
  Filled 2022-05-02: qty 60, 30d supply, fill #0

## 2022-05-22 ENCOUNTER — Other Ambulatory Visit: Payer: Self-pay

## 2022-06-13 ENCOUNTER — Ambulatory Visit (LOCAL_COMMUNITY_HEALTH_CENTER): Payer: 59

## 2022-06-13 DIAGNOSIS — Z719 Counseling, unspecified: Secondary | ICD-10-CM

## 2022-06-13 DIAGNOSIS — Z23 Encounter for immunization: Secondary | ICD-10-CM | POA: Diagnosis not present

## 2022-06-13 NOTE — Progress Notes (Signed)
  Are you feeling sick today? No   Have you ever received a dose of COVID-19 Vaccine? [employer], [place], [place], [place], Other) Yes  If yes, which vaccine and how many doses?   PFIZER, 5   Did you bring the vaccination record card or other documentation?  Yes   Do you have a health condition or are undergoing treatment that makes you moderately or severely immunocompromised? This would include, but not be limited to: cancer, HIV, organ transplant, immunosuppressive therapy/high-dose corticosteroids, or moderate/severe primary immunodeficiency.  No  Have you received COVID-19 vaccine before or during hematopoietic cell transplant (HCT) or CAR-T-cell therapies? No  Have you ever had an allergic reaction to: (This would include a severe allergic reaction or a reaction that caused hives, swelling, or respiratory distress, including wheezing.) A component of a COVID-19 vaccine or a previous dose of COVID-19 vaccine? No   Have you ever had an allergic reaction to another vaccine (other thanCOVID-19 vaccine) or an injectable medication? (This would include a severe allergic reaction or a reaction that caused hives, swelling, or respiratory distress, including wheezing.)   No    Do you have a history of any of the following:  Myocarditis or Pericarditis No  Dermal fillers:  No  Multisystem Inflammatory Syndrome (MIS-C or MIS-A)? No  COVID-19 disease within the past 3 months? No  Vaccinated with monkeypox vaccine in the last 4 weeks? No  Eligible for [facility], administered, monitored and tolerated well. Given VIS and NCIR copy, discussed and understood. M.Derrich Gaby, LPN.

## 2022-06-19 ENCOUNTER — Other Ambulatory Visit: Payer: Self-pay

## 2022-06-19 DIAGNOSIS — R7989 Other specified abnormal findings of blood chemistry: Secondary | ICD-10-CM | POA: Diagnosis not present

## 2022-06-19 DIAGNOSIS — E559 Vitamin D deficiency, unspecified: Secondary | ICD-10-CM | POA: Diagnosis not present

## 2022-06-19 DIAGNOSIS — E119 Type 2 diabetes mellitus without complications: Secondary | ICD-10-CM | POA: Diagnosis not present

## 2022-06-19 DIAGNOSIS — I25119 Atherosclerotic heart disease of native coronary artery with unspecified angina pectoris: Secondary | ICD-10-CM | POA: Diagnosis not present

## 2022-06-19 DIAGNOSIS — E7800 Pure hypercholesterolemia, unspecified: Secondary | ICD-10-CM | POA: Diagnosis not present

## 2022-06-19 DIAGNOSIS — Z23 Encounter for immunization: Secondary | ICD-10-CM | POA: Diagnosis not present

## 2022-06-19 DIAGNOSIS — Z09 Encounter for follow-up examination after completed treatment for conditions other than malignant neoplasm: Secondary | ICD-10-CM | POA: Diagnosis not present

## 2022-06-19 DIAGNOSIS — I10 Essential (primary) hypertension: Secondary | ICD-10-CM | POA: Diagnosis not present

## 2022-06-19 DIAGNOSIS — K219 Gastro-esophageal reflux disease without esophagitis: Secondary | ICD-10-CM | POA: Diagnosis not present

## 2022-06-19 MED ORDER — OMEPRAZOLE 20 MG PO CPDR
DELAYED_RELEASE_CAPSULE | ORAL | 3 refills | Status: DC
  Filled 2022-06-19: qty 180, 90d supply, fill #0

## 2022-06-19 MED ORDER — FARXIGA 10 MG PO TABS
10.0000 mg | ORAL_TABLET | Freq: Every day | ORAL | 1 refills | Status: DC
  Filled 2022-06-19: qty 90, 90d supply, fill #0

## 2022-06-19 MED ORDER — METFORMIN HCL 500 MG PO TABS: 500.0000 mg | ORAL_TABLET | Freq: Every day | ORAL | 1 refills | Status: DC

## 2022-06-19 MED ORDER — CARVEDILOL 3.125 MG PO TABS: ORAL_TABLET | ORAL | 1 refills | Status: AC

## 2022-06-19 MED ORDER — NITROGLYCERIN 0.4 MG SL SUBL
SUBLINGUAL_TABLET | SUBLINGUAL | 1 refills | Status: AC
  Filled 2022-06-19: qty 25, 8d supply, fill #0
  Filled 2022-07-03: qty 25, 8d supply, fill #1

## 2022-06-19 MED ORDER — COLCHICINE 0.6 MG PO TABS
ORAL_TABLET | ORAL | 0 refills | Status: AC
  Filled 2022-06-19: qty 20, 10d supply, fill #0

## 2022-06-19 MED ORDER — FUROSEMIDE 40 MG PO TABS
ORAL_TABLET | ORAL | 1 refills | Status: AC
  Filled 2022-07-03: qty 90, 90d supply, fill #0

## 2022-06-19 MED ORDER — LISINOPRIL 40 MG PO TABS
40.0000 mg | ORAL_TABLET | Freq: Every day | ORAL | 1 refills | Status: AC
  Filled 2022-06-19 – 2022-07-03 (×2): qty 90, 90d supply, fill #0
  Filled 2022-08-21: qty 45, 45d supply, fill #0

## 2022-06-19 MED ORDER — ATORVASTATIN CALCIUM 20 MG PO TABS: ORAL_TABLET | ORAL | 1 refills | Status: AC

## 2022-06-19 MED ORDER — AMLODIPINE BESYLATE 10 MG PO TABS
10.0000 mg | ORAL_TABLET | Freq: Every day | ORAL | 1 refills | Status: AC
  Filled 2022-06-19: qty 90, 90d supply, fill #0
  Filled 2022-08-21: qty 45, 45d supply, fill #0

## 2022-06-20 ENCOUNTER — Other Ambulatory Visit: Payer: Self-pay

## 2022-06-20 MED ORDER — METFORMIN HCL 500 MG PO TABS
ORAL_TABLET | ORAL | 1 refills | Status: AC
  Filled 2022-06-20 – 2022-07-01 (×2): qty 180, 90d supply, fill #0

## 2022-06-21 ENCOUNTER — Other Ambulatory Visit: Payer: Self-pay

## 2022-06-24 ENCOUNTER — Other Ambulatory Visit: Payer: Self-pay

## 2022-06-25 ENCOUNTER — Other Ambulatory Visit: Payer: Self-pay

## 2022-06-25 MED ORDER — ALPRAZOLAM 0.5 MG PO TABS
ORAL_TABLET | ORAL | 0 refills | Status: AC
  Filled 2022-06-25: qty 60, 30d supply, fill #0

## 2022-07-01 ENCOUNTER — Other Ambulatory Visit: Payer: Self-pay

## 2022-07-03 ENCOUNTER — Other Ambulatory Visit: Payer: Self-pay

## 2022-07-23 DIAGNOSIS — N4889 Other specified disorders of penis: Secondary | ICD-10-CM | POA: Diagnosis not present

## 2022-07-23 DIAGNOSIS — E119 Type 2 diabetes mellitus without complications: Secondary | ICD-10-CM | POA: Diagnosis not present

## 2022-07-24 ENCOUNTER — Other Ambulatory Visit: Payer: Self-pay

## 2022-08-09 ENCOUNTER — Ambulatory Visit: Payer: Self-pay | Admitting: Family Medicine

## 2022-08-09 ENCOUNTER — Encounter: Payer: Self-pay | Admitting: Family Medicine

## 2022-08-09 DIAGNOSIS — E7800 Pure hypercholesterolemia, unspecified: Secondary | ICD-10-CM | POA: Diagnosis not present

## 2022-08-09 DIAGNOSIS — I25119 Atherosclerotic heart disease of native coronary artery with unspecified angina pectoris: Secondary | ICD-10-CM | POA: Diagnosis not present

## 2022-08-09 DIAGNOSIS — Z9889 Other specified postprocedural states: Secondary | ICD-10-CM | POA: Diagnosis not present

## 2022-08-09 DIAGNOSIS — E119 Type 2 diabetes mellitus without complications: Secondary | ICD-10-CM | POA: Diagnosis not present

## 2022-08-09 DIAGNOSIS — Z952 Presence of prosthetic heart valve: Secondary | ICD-10-CM | POA: Diagnosis not present

## 2022-08-09 DIAGNOSIS — I10 Essential (primary) hypertension: Secondary | ICD-10-CM | POA: Diagnosis not present

## 2022-08-09 DIAGNOSIS — Z113 Encounter for screening for infections with a predominantly sexual mode of transmission: Secondary | ICD-10-CM

## 2022-08-09 DIAGNOSIS — Z951 Presence of aortocoronary bypass graft: Secondary | ICD-10-CM | POA: Diagnosis not present

## 2022-08-09 LAB — HEPATITIS B SURFACE ANTIGEN: Hepatitis B Surface Ag: NEGATIVE

## 2022-08-09 NOTE — Progress Notes (Signed)
Pt. seen for STI screening by [facility]. No tx indicated at this time

## 2022-08-09 NOTE — Progress Notes (Signed)
[facility] Department STI clinic/screening visit  Subjective:  Curtis Maxwell is a 61 y.o. male being seen today for an STI screening visit. The patient reports they do have symptoms.    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   Critical aortic valve stenosis 02/04/2020   Unstable angina ([place]) 02/03/2020   Erectile dysfunction 10/06/2019   Low vitamin B12 level 04/09/2019   Type 2 diabetes mellitus without complication, without long-term current use of insulin ([place]) 04/09/2019   Vitamin D deficiency 04/09/2019   Abnormal skin color 16/06/9603   Systolic murmur 54/05/8118   Chest pain with high risk for cardiac etiology 02/27/2017   Primary osteoarthritis of right knee 04/04/2016   Coronary artery disease involving native coronary artery of native heart with angina pectoris ([place]) 02/20/2015   H/O cardiac catheterization 02/17/2014   GERD (gastroesophageal reflux disease) 12/11/2013   HTN (hypertension) 12/11/2013   Hyperlipidemia 12/11/2013   Tobacco abuse 12/11/2013     Chief Complaint  Patient presents with   SEXUALLY TRANSMITTED DISEASE    Pt. Reports iching and rash    HPI  Patient reports to clinic for STI testing.  Last HIV test per patient/review of record was No results found for: "HMHIVSCREEN"  Lab Results  Component Value Date   HIV Non Reactive 02/04/2020    Does the patient or their partner desires a pregnancy in the next year? No  Screening for MPX risk: Does the patient have an unexplained rash? No Is the patient MSM? No Does the patient endorse multiple sex partners or anonymous sex partners? No Did the patient have close or sexual contact with a person diagnosed with MPX? No Has the patient traveled outside the [place] where MPX is endemic?  No Is there a high clinical suspicion for MPX-- evidenced by one of the following No  -Unlikely to be chickenpox  -Lymphadenopathy  -Rash that present in same phase of evolution on any given body part   See flowsheet for further details and programmatic requirements.   Immunization History  Administered Date(s) Administered   Hep A / Hep B 10/19/2008, 11/16/2008, 04/19/2009   Influenza-Unspecified 06/12/2020   Moderna Covid-19 Vaccine Bivalent Booster 71yr & up 05/18/2021, 06/13/2022   Moderna Sars-Covid-2 Vaccination 11/18/2019, 12/21/2019, 03/28/2020, 07/07/2020, 01/22/2021   Pneumococcal Polysaccharide-23 10/06/2019   Tdap 04/09/2019     The following portions of the patient's history were reviewed and updated as appropriate: allergies, current medications, past medical history, past social history, past surgical history and problem list.  Objective:  There were no vitals filed for this visit.  Physical Exam Constitutional:      Appearance: Normal appearance.  HENT:     Head: Normocephalic and atraumatic.     Comments: No nits or hair loss    Mouth/Throat:     Mouth: Mucous membranes are moist. No oral lesions.     Pharynx: Oropharynx is clear. No oropharyngeal exudate or posterior oropharyngeal erythema.  Eyes:     General:        Right eye: No discharge.        Left eye: No discharge.     Conjunctiva/sclera:     Right eye: Right conjunctiva is not injected. No exudate.    Left eye: Left conjunctiva is not injected.  No exudate. Pulmonary:     Effort: Pulmonary effort is normal.  Abdominal:     General: Abdomen is flat.     Palpations: Abdomen is soft. There is no hepatomegaly or mass.     Tenderness: There is no abdominal tenderness. There is no rebound.     Hernia: There is no hernia in the left inguinal area or right inguinal area.  Genitourinary:    Pubic Area: No rash or pubic lice (no nits).      Penis: Normal. No tenderness, discharge, swelling or lesions.       Testes: Normal.     Epididymis:     Right: Normal. No mass or tenderness.     Left: Normal. No mass or tenderness.     Rectum: Normal. No tenderness (no lesions or discharge).     Comments: Penile Discharge Amount: None Color:  None Lymphadenopathy:     Head:     Right side of head: No preauricular or posterior auricular adenopathy.     Left side of head: No preauricular or posterior auricular adenopathy.     Cervical: No cervical adenopathy.     Upper Body:     Right upper body: No supraclavicular, axillary or epitrochlear adenopathy.     Left upper body: No supraclavicular, axillary or epitrochlear adenopathy.     Lower Body: No right inguinal adenopathy. No left inguinal adenopathy.  Skin:    General: Skin is warm and dry.     Findings: No lesion or rash.  Neurological:     Mental Status: He is alert and oriented to person, place, and time.     Assessment and Plan:  Curtis Maxwell is a 61 y.o. male presenting to the [place] for STI screening  1. Screening for venereal disease Pt states he's had some penile itching. Denies any new partners. Current partner x 30 years, doesn't think she's had new partners.  - Syphilis Serology, [facility] - Chlamydia/GC NAA, Confirmation -Gonococcus Culture  Patient does have STI symptoms Patient accepted all screenings including   Patient meets criteria for HepB screening? No. Ordered? not applicable Patient meets criteria for HepC screening? No. Ordered? not applicable Recommended condom use with all sex Discussed importance of condom use for STI prevent  Treat per standing order Discussed time line for [facility] results and that patient will be called with positive results and encouraged patient to call if he had not heard in 2 weeks Recommended returning for continued or worsening symptoms.   RTC with concerns.   Future Appointments  Date Time Provider [place]  08/09/2022  2:00 PM  [place], [place], [place] [facility] None   Total time spent 20 minutes.  Sharlet Salina, [place]

## 2022-08-13 ENCOUNTER — Telehealth: Payer: Self-pay | Admitting: Family Medicine

## 2022-08-13 DIAGNOSIS — N481 Balanitis: Secondary | ICD-10-CM | POA: Diagnosis not present

## 2022-08-13 LAB — GONOCOCCUS CULTURE

## 2022-08-13 NOTE — Telephone Encounter (Signed)
Please give me a call want to know my results of my STI test

## 2022-08-13 NOTE — Telephone Encounter (Signed)
Pt states that he was here for an STI check on Friday and that he was told to come back if his symptoms got worse. He states that his symptoms are worse.

## 2022-08-14 LAB — CHLAMYDIA/GC NAA, CONFIRMATION
Chlamydia trachomatis, NAA: NEGATIVE
Neisseria gonorrhoeae, NAA: NEGATIVE

## 2022-08-21 ENCOUNTER — Other Ambulatory Visit: Payer: Self-pay

## 2022-08-29 ENCOUNTER — Telehealth: Payer: Self-pay | Admitting: Family Medicine

## 2022-08-29 DIAGNOSIS — Z951 Presence of aortocoronary bypass graft: Secondary | ICD-10-CM | POA: Diagnosis not present

## 2022-08-29 DIAGNOSIS — R0789 Other chest pain: Secondary | ICD-10-CM | POA: Diagnosis not present

## 2022-08-29 DIAGNOSIS — I10 Essential (primary) hypertension: Secondary | ICD-10-CM | POA: Diagnosis not present

## 2022-08-29 DIAGNOSIS — R002 Palpitations: Secondary | ICD-10-CM | POA: Diagnosis not present

## 2022-08-29 DIAGNOSIS — R0602 Shortness of breath: Secondary | ICD-10-CM | POA: Diagnosis not present

## 2022-08-29 DIAGNOSIS — Z9889 Other specified postprocedural states: Secondary | ICD-10-CM | POA: Diagnosis not present

## 2022-08-29 DIAGNOSIS — E119 Type 2 diabetes mellitus without complications: Secondary | ICD-10-CM | POA: Diagnosis not present

## 2022-08-29 DIAGNOSIS — Z952 Presence of prosthetic heart valve: Secondary | ICD-10-CM | POA: Diagnosis not present

## 2022-08-29 DIAGNOSIS — I25119 Atherosclerotic heart disease of native coronary artery with unspecified angina pectoris: Secondary | ICD-10-CM | POA: Diagnosis not present

## 2022-08-29 NOTE — Telephone Encounter (Signed)
Pt wants for someone from the clinic to call him regarding his tests results. Thanks

## 2022-09-19 DIAGNOSIS — R0602 Shortness of breath: Secondary | ICD-10-CM | POA: Diagnosis not present

## 2022-09-19 DIAGNOSIS — I25119 Atherosclerotic heart disease of native coronary artery with unspecified angina pectoris: Secondary | ICD-10-CM | POA: Diagnosis not present

## 2022-09-19 DIAGNOSIS — Z951 Presence of aortocoronary bypass graft: Secondary | ICD-10-CM | POA: Diagnosis not present

## 2022-10-02 DIAGNOSIS — Z952 Presence of prosthetic heart valve: Secondary | ICD-10-CM | POA: Diagnosis not present

## 2022-10-02 DIAGNOSIS — E119 Type 2 diabetes mellitus without complications: Secondary | ICD-10-CM | POA: Diagnosis not present

## 2022-10-02 DIAGNOSIS — Z951 Presence of aortocoronary bypass graft: Secondary | ICD-10-CM | POA: Diagnosis not present

## 2022-10-02 DIAGNOSIS — E7800 Pure hypercholesterolemia, unspecified: Secondary | ICD-10-CM | POA: Diagnosis not present

## 2022-10-02 DIAGNOSIS — I10 Essential (primary) hypertension: Secondary | ICD-10-CM | POA: Diagnosis not present

## 2022-10-02 DIAGNOSIS — Z9889 Other specified postprocedural states: Secondary | ICD-10-CM | POA: Diagnosis not present

## 2022-10-02 DIAGNOSIS — I25119 Atherosclerotic heart disease of native coronary artery with unspecified angina pectoris: Secondary | ICD-10-CM | POA: Diagnosis not present

## 2022-10-02 DIAGNOSIS — R079 Chest pain, unspecified: Secondary | ICD-10-CM | POA: Diagnosis not present

## 2022-10-14 DIAGNOSIS — L811 Chloasma: Secondary | ICD-10-CM | POA: Diagnosis not present

## 2022-11-17 ENCOUNTER — Other Ambulatory Visit: Payer: Self-pay

## 2022-11-17 ENCOUNTER — Emergency Department: Payer: Medicare PPO

## 2022-11-17 ENCOUNTER — Encounter: Payer: Self-pay | Admitting: Internal Medicine

## 2022-11-17 ENCOUNTER — Observation Stay
Admission: EM | Admit: 2022-11-17 | Discharge: 2022-11-18 | Disposition: A | Discharge status: Home / Self Care | Payer: Medicare PPO | Attending: Obstetrics and Gynecology | Admitting: Obstetrics and Gynecology

## 2022-11-17 DIAGNOSIS — I2510 Atherosclerotic heart disease of native coronary artery without angina pectoris: Secondary | ICD-10-CM | POA: Insufficient documentation

## 2022-11-17 DIAGNOSIS — R079 Chest pain, unspecified: Secondary | ICD-10-CM | POA: Diagnosis not present

## 2022-11-17 DIAGNOSIS — Z7984 Long term (current) use of oral hypoglycemic drugs: Secondary | ICD-10-CM | POA: Diagnosis not present

## 2022-11-17 DIAGNOSIS — Z7982 Long term (current) use of aspirin: Secondary | ICD-10-CM | POA: Diagnosis not present

## 2022-11-17 DIAGNOSIS — J810 Acute pulmonary edema: Secondary | ICD-10-CM | POA: Diagnosis not present

## 2022-11-17 DIAGNOSIS — R0602 Shortness of breath: Secondary | ICD-10-CM | POA: Insufficient documentation

## 2022-11-17 DIAGNOSIS — I110 Hypertensive heart disease with heart failure: Secondary | ICD-10-CM | POA: Insufficient documentation

## 2022-11-17 DIAGNOSIS — J209 Acute bronchitis, unspecified: Secondary | ICD-10-CM

## 2022-11-17 DIAGNOSIS — E11649 Type 2 diabetes mellitus with hypoglycemia without coma: Secondary | ICD-10-CM | POA: Insufficient documentation

## 2022-11-17 DIAGNOSIS — R062 Wheezing: Secondary | ICD-10-CM | POA: Insufficient documentation

## 2022-11-17 DIAGNOSIS — Z1152 Encounter for screening for COVID-19: Secondary | ICD-10-CM | POA: Diagnosis not present

## 2022-11-17 DIAGNOSIS — Z87891 Personal history of nicotine dependence: Secondary | ICD-10-CM | POA: Diagnosis not present

## 2022-11-17 DIAGNOSIS — I10 Essential (primary) hypertension: Secondary | ICD-10-CM | POA: Diagnosis not present

## 2022-11-17 DIAGNOSIS — R7989 Other specified abnormal findings of blood chemistry: Secondary | ICD-10-CM

## 2022-11-17 DIAGNOSIS — R0600 Dyspnea, unspecified: Secondary | ICD-10-CM | POA: Insufficient documentation

## 2022-11-17 DIAGNOSIS — E162 Hypoglycemia, unspecified: Secondary | ICD-10-CM

## 2022-11-17 DIAGNOSIS — Z952 Presence of prosthetic heart valve: Secondary | ICD-10-CM

## 2022-11-17 DIAGNOSIS — Z951 Presence of aortocoronary bypass graft: Secondary | ICD-10-CM

## 2022-11-17 DIAGNOSIS — R778 Other specified abnormalities of plasma proteins: Principal | ICD-10-CM | POA: Insufficient documentation

## 2022-11-17 DIAGNOSIS — R059 Cough, unspecified: Secondary | ICD-10-CM | POA: Diagnosis not present

## 2022-11-17 DIAGNOSIS — I5032 Chronic diastolic (congestive) heart failure: Secondary | ICD-10-CM | POA: Diagnosis not present

## 2022-11-17 DIAGNOSIS — J9801 Acute bronchospasm: Secondary | ICD-10-CM

## 2022-11-17 DIAGNOSIS — Z79899 Other long term (current) drug therapy: Secondary | ICD-10-CM | POA: Diagnosis not present

## 2022-11-17 DIAGNOSIS — I209 Angina pectoris, unspecified: Secondary | ICD-10-CM | POA: Diagnosis not present

## 2022-11-17 DIAGNOSIS — I25810 Atherosclerosis of coronary artery bypass graft(s) without angina pectoris: Secondary | ICD-10-CM | POA: Diagnosis not present

## 2022-11-17 LAB — TROPONIN I (HIGH SENSITIVITY)
Troponin I (High Sensitivity): 180 ng/L (ref <18)
Troponin I (High Sensitivity): 243 ng/L (ref <18)
Troponin I (High Sensitivity): 256 ng/L (ref <18)
Troponin I (High Sensitivity): 313 ng/L (ref <18)

## 2022-11-17 LAB — CBC
HCT: 41.8 % (ref 39.0–52.0)
Hemoglobin: 13.3 g/dL (ref 13.0–17.0)
MCH: 26.4 pg (ref 26.0–34.0)
MCHC: 31.8 g/dL (ref 30.0–36.0)
MCV: 82.9 fL (ref 80.0–100.0)
Platelets: 225 10*3/uL (ref 150–400)
RBC: 5.04 MIL/uL (ref 4.22–5.81)
RDW: 12.9 % (ref 11.5–15.5)
WBC: 9.6 10*3/uL (ref 4.0–10.5)
nRBC: 0 % (ref 0.0–0.2)

## 2022-11-17 LAB — RESP PANEL BY RT-PCR (RSV, FLU A&B, COVID)  RVPGX2
Influenza A by PCR: NEGATIVE
Influenza B by PCR: NEGATIVE
Resp Syncytial Virus by PCR: NEGATIVE
SARS Coronavirus 2 by RT PCR: NEGATIVE

## 2022-11-17 LAB — GLUCOSE, CAPILLARY
Glucose-Capillary: 192 mg/dL — ABNORMAL HIGH (ref 70–99)
Glucose-Capillary: 214 mg/dL — ABNORMAL HIGH (ref 70–99)
Glucose-Capillary: 193 mg/dL — ABNORMAL HIGH (ref 70–99)

## 2022-11-17 LAB — HIV ANTIBODY (ROUTINE TESTING W REFLEX): HIV Screen 4th Generation wRfx: NONREACTIVE

## 2022-11-17 LAB — BASIC METABOLIC PANEL
Anion gap: 12 (ref 5–15)
BUN: 15 mg/dL (ref 6–20)
CO2: 21 mmol/L — ABNORMAL LOW (ref 22–32)
Calcium: 8.5 mg/dL — ABNORMAL LOW (ref 8.9–10.3)
Chloride: 95 mmol/L — ABNORMAL LOW (ref 98–111)
Creatinine, Ser: 1.12 mg/dL (ref 0.61–1.24)
GFR, Estimated: >60 mL/min (ref >60)
Glucose, Bld: 64 mg/dL — ABNORMAL LOW (ref 70–99)
Potassium: 3.4 mmol/L — ABNORMAL LOW (ref 3.5–5.1)
Sodium: 128 mmol/L — ABNORMAL LOW (ref 135–145)

## 2022-11-17 LAB — CBG MONITORING, ED
Glucose-Capillary: 59 mg/dL — ABNORMAL LOW (ref 70–99)
Glucose-Capillary: 174 mg/dL — ABNORMAL HIGH (ref 70–99)
Glucose-Capillary: 142 mg/dL — ABNORMAL HIGH (ref 70–99)

## 2022-11-17 LAB — MAGNESIUM: Magnesium: 1.8 mg/dL (ref 1.7–2.4)

## 2022-11-17 LAB — BRAIN NATRIURETIC PEPTIDE: B Natriuretic Peptide: 124.2 pg/mL — ABNORMAL HIGH (ref 0.0–100.0)

## 2022-11-17 MED ORDER — ATORVASTATIN CALCIUM 20 MG PO TABS
40.0000 mg | ORAL_TABLET | Freq: Every day | ORAL | Status: DC
  Administered 2022-11-17 – 2022-11-18 (×2): 40 mg via ORAL
  Filled 2022-11-17 (×2): qty 2

## 2022-11-17 MED ORDER — DEXTROSE 50 % IV SOLN
1.0000 | Freq: Once | INTRAVENOUS | Status: AC
  Administered 2022-11-17: 50 mL via INTRAVENOUS
  Filled 2022-11-17: qty 50

## 2022-11-17 MED ORDER — METHYLPREDNISOLONE SODIUM SUCC 125 MG IJ SOLR
125.0000 mg | Freq: Once | INTRAMUSCULAR | Status: AC
  Administered 2022-11-17: 125 mg via INTRAVENOUS
  Filled 2022-11-17: qty 2

## 2022-11-17 MED ORDER — ATORVASTATIN CALCIUM 20 MG PO TABS: 20.0000 mg | ORAL_TABLET | Freq: Every day | ORAL | Status: DC

## 2022-11-17 MED ORDER — IPRATROPIUM-ALBUTEROL 0.5-2.5 (3) MG/3ML IN SOLN
3.0000 mL | Freq: Once | RESPIRATORY_TRACT | Status: AC
  Administered 2022-11-17: 3 mL via RESPIRATORY_TRACT
  Filled 2022-11-17: qty 3

## 2022-11-17 MED ORDER — PREDNISONE 20 MG PO TABS
40.0000 mg | ORAL_TABLET | Freq: Every day | ORAL | Status: DC
  Administered 2022-11-17 – 2022-11-18 (×2): 40 mg via ORAL
  Filled 2022-11-17 (×2): qty 2

## 2022-11-17 MED ORDER — ONDANSETRON HCL 4 MG PO TABS: 4.0000 mg | ORAL_TABLET | Freq: Four times a day (QID) | ORAL | Status: DC | PRN

## 2022-11-17 MED ORDER — LISINOPRIL 20 MG PO TABS
40.0000 mg | ORAL_TABLET | Freq: Every day | ORAL | Status: DC
  Administered 2022-11-17 – 2022-11-18 (×2): 40 mg via ORAL
  Filled 2022-11-17 (×2): qty 2

## 2022-11-17 MED ORDER — ALBUTEROL SULFATE (2.5 MG/3ML) 0.083% IN NEBU
2.5000 mg | INHALATION_SOLUTION | RESPIRATORY_TRACT | Status: DC | PRN
  Administered 2022-11-17: 2.5 mg via RESPIRATORY_TRACT
  Filled 2022-11-17: qty 3

## 2022-11-17 MED ORDER — INSULIN ASPART 100 UNIT/ML IJ SOLN
0.0000 [IU] | Freq: Three times a day (TID) | INTRAMUSCULAR | Status: DC
  Administered 2022-11-17: 5 [IU] via SUBCUTANEOUS
  Administered 2022-11-17: 3 [IU] via SUBCUTANEOUS
  Administered 2022-11-18: 2 [IU] via SUBCUTANEOUS
  Administered 2022-11-18: 3 [IU] via SUBCUTANEOUS
  Administered 2022-11-18: 2 [IU] via SUBCUTANEOUS
  Filled 2022-11-17 (×5): qty 1

## 2022-11-17 MED ORDER — FUROSEMIDE 40 MG PO TABS
40.0000 mg | ORAL_TABLET | Freq: Every day | ORAL | Status: DC
  Administered 2022-11-17 – 2022-11-18 (×2): 40 mg via ORAL
  Filled 2022-11-17 (×2): qty 1

## 2022-11-17 MED ORDER — NITROGLYCERIN 0.4 MG SL SUBL: 0.4000 mg | SUBLINGUAL_TABLET | SUBLINGUAL | Status: DC | PRN

## 2022-11-17 MED ORDER — IPRATROPIUM-ALBUTEROL 0.5-2.5 (3) MG/3ML IN SOLN
3.0000 mL | Freq: Four times a day (QID) | RESPIRATORY_TRACT | Status: DC
  Administered 2022-11-17: 3 mL via RESPIRATORY_TRACT
  Filled 2022-11-17: qty 3

## 2022-11-17 MED ORDER — CARVEDILOL 3.125 MG PO TABS
3.1250 mg | ORAL_TABLET | Freq: Two times a day (BID) | ORAL | Status: DC
  Administered 2022-11-17 – 2022-11-18 (×4): 3.125 mg via ORAL
  Filled 2022-11-17 (×4): qty 1

## 2022-11-17 MED ORDER — POTASSIUM CHLORIDE CRYS ER 20 MEQ PO TBCR
40.0000 meq | EXTENDED_RELEASE_TABLET | Freq: Once | ORAL | Status: AC
  Administered 2022-11-17: 40 meq via ORAL
  Filled 2022-11-17: qty 2

## 2022-11-17 MED ORDER — ACETAMINOPHEN 650 MG RE SUPP: 650.0000 mg | Freq: Four times a day (QID) | RECTAL | Status: DC | PRN

## 2022-11-17 MED ORDER — ENOXAPARIN SODIUM 60 MG/0.6ML IJ SOSY
0.5000 mg/kg | PREFILLED_SYRINGE | INTRAMUSCULAR | Status: DC
  Administered 2022-11-17 – 2022-11-18 (×2): 47.5 mg via SUBCUTANEOUS
  Filled 2022-11-17 (×2): qty 0.6

## 2022-11-17 MED ORDER — ACETAMINOPHEN 325 MG PO TABS: 650.0000 mg | ORAL_TABLET | Freq: Four times a day (QID) | ORAL | Status: DC | PRN

## 2022-11-17 MED ORDER — ASPIRIN 325 MG PO TBEC
325.0000 mg | DELAYED_RELEASE_TABLET | Freq: Every day | ORAL | Status: DC
  Administered 2022-11-17 – 2022-11-18 (×2): 325 mg via ORAL
  Filled 2022-11-17 (×2): qty 1

## 2022-11-17 MED ORDER — INSULIN ASPART 100 UNIT/ML IJ SOLN: 0.0000 [IU] | Freq: Every day | INTRAMUSCULAR | Status: DC

## 2022-11-17 MED ORDER — PANTOPRAZOLE SODIUM 40 MG PO TBEC
40.0000 mg | DELAYED_RELEASE_TABLET | Freq: Every day | ORAL | Status: DC
  Administered 2022-11-17 – 2022-11-18 (×2): 40 mg via ORAL
  Filled 2022-11-17 (×2): qty 1

## 2022-11-17 MED ORDER — GUAIFENESIN ER 600 MG PO TB12
600.0000 mg | ORAL_TABLET | Freq: Two times a day (BID) | ORAL | Status: DC
  Administered 2022-11-17 – 2022-11-18 (×3): 600 mg via ORAL
  Filled 2022-11-17 (×3): qty 1

## 2022-11-17 MED ORDER — DEXTROSE 5 % IV SOLN: INTRAVENOUS | Status: AC

## 2022-11-17 MED ORDER — DAPAGLIFLOZIN PROPANEDIOL 10 MG PO TABS
10.0000 mg | ORAL_TABLET | Freq: Every day | ORAL | Status: DC
  Administered 2022-11-17 – 2022-11-18 (×2): 10 mg via ORAL
  Filled 2022-11-17 (×3): qty 1

## 2022-11-17 MED ORDER — ONDANSETRON HCL 4 MG/2ML IJ SOLN: 4.0000 mg | Freq: Four times a day (QID) | INTRAMUSCULAR | Status: DC | PRN

## 2022-11-17 MED ORDER — ASPIRIN 81 MG PO CHEW
324.0000 mg | CHEWABLE_TABLET | Freq: Once | ORAL | Status: AC
  Administered 2022-11-17: 324 mg via ORAL
  Filled 2022-11-17: qty 4

## 2022-11-17 MED ORDER — AMLODIPINE BESYLATE 10 MG PO TABS
10.0000 mg | ORAL_TABLET | Freq: Every day | ORAL | Status: DC
  Administered 2022-11-17 – 2022-11-18 (×2): 10 mg via ORAL
  Filled 2022-11-17: qty 2
  Filled 2022-11-17: qty 1

## 2022-11-17 NOTE — Assessment & Plan Note (Addendum)
CAD s/p CABG Abnormal nuclear stress test 09/2022 Patient has no chest pain and EKG is nonacute Continue to trend troponin Continue aspirin, atorvastatin, carvedilol, lisinopril, nitroglycerin and [facility] Cardiology consulted--- patient had declined cardiac cath in January

## 2022-11-17 NOTE — Progress Notes (Signed)
Anticoagulation monitoring(Lovenox):  62 yo male ordered Lovenox 40 mg Q24h    Filed Weights   11/17/22 0317  Weight: 95.3 kg (210 lb)   BMI 33.9    Lab Results  Component Value Date   CREATININE 1.12 11/17/2022   CREATININE 1.29 (H) 03/12/2020   CREATININE 1.33 (H) 03/11/2020   Estimated Creatinine Clearance: 75.8 mL/min (by C-G formula based on SCr of 1.12 mg/dL). Hemoglobin & Hematocrit     Component Value Date/Time   HGB 13.3 11/17/2022 0323   HCT 41.8 11/17/2022 0323     Per Protocol for Patient with estCrcl > 30 ml/min and BMI > 30, will transition to Lovenox 47.5 mg Q24h.

## 2022-11-17 NOTE — ED Triage Notes (Signed)
Pt presents with shortness of breath for the past week. Pt reports tonight he developed expiratory wheezing. Pt has significant heart history, triple by pass surgery. Pt talks in complete sentences no respiratory distress noted

## 2022-11-17 NOTE — Assessment & Plan Note (Signed)
Patient with no past history of asthma or COPD Received DuoNebs in the ED as well as Solu-Medrol Follow-up respiratory viral panel Continue DuoNebs as needed and will continue with oral prednisone

## 2022-11-17 NOTE — Progress Notes (Signed)
Brief cardiology consult note  Impression Possible angina Known coronary disease History of aortic valve replacement SAVR History of coronary bypass surgery Hypertension Diabetes Abnormal functional study consistent with ischemia  Plan .  Admit rule out microinfarction follow-up troponins EKGs and telemetry Heparin IV anticoagulation for possible unstable angina acute coronary syndrome Echocardiogram for assessment of ventricular function and evaluation of the prosthetic valve Consider cardiac cath with chest pain and abnormal functional study recently Abnormal EKG is not helpful bundle branch block Inhalers as necessary for wheezing congestion Does diuretic therapy also may be helpful  Full consult note to follow  [facility] Cardiology

## 2022-11-17 NOTE — H&P (Signed)
History and Physical    Patient: Curtis Maxwell [id number] DOB: Jan 23, 1961 DOA: 11/17/2022 DOS: the patient was seen and examined on 11/17/2022 PCP: [facility]  Patient coming from: Home  Chief Complaint:  Chief Complaint  Patient presents with   Shortness of Breath   Chest Pain    HPI: Curtis Maxwell is a 62 y.o. male with medical history significant for DM, HTN, CAD s/p CABG and aortic stenosis s/p TAVR with recent nuclear stress test 09/19/2022 that was consistent with ischemia who presents to the ED with shortness of breath and wheezing.  He denied chest pain, lower extremity pain or swelling, fever or chills.  Patient has had chronic exertional shortness of breath without orthopnea PND or pedal edema and intermittent chest pain in the recent past, reason for which she underwent the stress test. ED course and Data review: Vitals within normal limits.  Troponin 180 and BNP 124.  Sodium 128 and potassium 3.4.  Respiratory viral panel pending.  EKG, personally viewed and interpreted showing NSR with old LBBB.  Chest x-ray clear Patient was treated with DuoNeb and Solu-Medrol and given a chewable aspirin.  Admission requested due to cardiac history with elevated troponin.   Review of Systems: As mentioned in the history of present illness. All other systems reviewed and are negative.  Past Medical History:  Diagnosis Date   Coronary artery disease    Diabetes mellitus without complication (HCC)    High cholesterol    Hypertension    Myocardial infarct, old    Plantar fasciitis    Past Surgical History:  Procedure Laterality Date   AORTIC VALVE REPLACEMENT N/A 03/03/2020   Procedure: AORTIC VALVE REPLACEMENT (AVR) Inspiris 62mm;  Surgeon: Ivin Poot, MD;  Location: [facility];  Service: Open Heart Surgery;  Laterality: N/A;   CARDIAC SURGERY     COLONOSCOPY WITH PROPOFOL N/A 03/16/2021   Procedure: COLONOSCOPY WITH PROPOFOL;  Surgeon: Robert Bellow, MD;  Location:  [facility];  Service: Endoscopy;  Laterality: N/A;   CORONARY ANGIOPLASTY WITH STENT PLACEMENT     CORONARY ARTERY BYPASS GRAFT N/A 03/03/2020   Procedure: CORONARY ARTERY BYPASS GRAFTING (CABG) x3. Endoscopic saphenous vein harvest  ;  Surgeon: Ivin Poot, MD;  Location: [facility];  Service: Open Heart Surgery;  Laterality: N/A;   FLEXIBLE BRONCHOSCOPY N/A 03/03/2020   Procedure: FLEXIBLE BRONCHOSCOPY;  Surgeon: Ivin Poot, MD;  Location: [facility];  Service: Open Heart Surgery;  Laterality: N/A;   IR THORACENTESIS ASP PLEURAL SPACE W/IMG GUIDE  04/07/2020   RIGHT/LEFT HEART CATH AND CORONARY ANGIOGRAPHY N/A 12/31/2017   Procedure: RIGHT/LEFT HEART CATH AND CORONARY ANGIOGRAPHY;  Surgeon: Isaias Cowman, MD;  Location: [facility];  Service: Cardiovascular;  Laterality: N/A;   RIGHT/LEFT HEART CATH AND CORONARY ANGIOGRAPHY N/A 02/15/2020   Procedure: RIGHT/LEFT HEART CATH AND CORONARY ANGIOGRAPHY;  Surgeon: Isaias Cowman, MD;  Location: [facility];  Service: Cardiovascular;  Laterality: N/A;   TEE WITHOUT CARDIOVERSION N/A 03/03/2020   Procedure: TRANSESOPHAGEAL ECHOCARDIOGRAM (TEE);  Surgeon: Prescott Gum, Collier Salina, MD;  Location: [facility];  Service: Open Heart Surgery;  Laterality: N/A;   Social History:  reports that he quit smoking about 24 years ago. His smoking use included e-cigarettes and cigarettes. He has a 15.00 pack-year smoking history. He has never used smokeless tobacco. He reports current alcohol use of about 10.0 standard drinks of alcohol per week. He reports that he does not use drugs.  No Known  Allergies  Family History  Problem Relation Age of Onset   Gout Brother     Prior to Admission medications   Medication Sig Start Date End Date Taking? Authorizing Provider  acetaminophen (TYLENOL) 325 MG tablet Take 2 tablets (650 mg total) by mouth every 6 (six) hours as needed for mild pain or fever. 03/12/20   Nani Skillern, PA-C  ALPRAZolam Duanne Moron)  0.5 MG tablet Take 1 tablet (0.5 mg total) by mouth 2 (two) times daily as needed for Sleep 06/25/22     amLODipine (NORVASC) 10 MG tablet Take 1 tablet (10 mg total) by mouth daily. 06/19/22     amoxicillin (AMOXIL) 500 MG capsule Take 4 capsules (2,000 mg total) by mouth 1 hour prior to dental appointment. 01/04/21     aspirin EC 325 MG EC tablet Take 1 tablet (325 mg total) by mouth daily. 03/13/20   Nani Skillern, PA-C  atorvastatin (LIPITOR) 20 MG tablet Take 1 tablet (20 mg total) by mouth once daily 06/19/22     carvedilol (COREG) 3.125 MG tablet Take 1 tablet (3.125 mg total) by mouth 2 (two) times daily with meals 06/19/22     Cholecalciferol 50 MCG (2000 UT) CAPS Take 2,000 Units by mouth daily.  04/09/19   [provider]  colchicine 0.6 MG tablet Take 1 tablet (0.6 mg total) by mouth 2 (two) times daily as needed 06/19/22     dapagliflozin propanediol (FARXIGA) 10 MG TABS tablet Take 1 tablet (10 mg total) by mouth once daily 06/19/22     furosemide (LASIX) 40 MG tablet Take 1 tablet (40 mg total) by mouth once daily as needed for edema 06/19/22     lisinopril (ZESTRIL) 40 MG tablet Take 1 tablet (40 mg total) by mouth daily. 06/19/22     metFORMIN (GLUCOPHAGE) 500 MG tablet Take 1 tablet (500 mg total) by mouth 2 (two) times daily with meals 06/19/22     nitroGLYCERIN (NITROSTAT) 0.4 MG SL tablet Place 1 tablet (0.4 mg total) under the tongue every 5 (five) minutes as needed for Chest pain. May take up to 3 doses. 06/19/22     Omega-3 Fatty Acids (FISH OIL) 1200 MG CAPS Take 1,200 mg by mouth 2 (two) times a week.     [provider]  omeprazole (PRILOSEC) 20 MG capsule Take 1 capsule (20 mg total) by mouth 2 (two) times daily before meals 06/07/21     sildenafil (REVATIO) 20 MG tablet Take 60-80 mg by mouth daily as needed (erectile dysfuntion.).  08/20/19   [provider]    Physical Exam: Vitals:   11/17/22 0305 11/17/22 0317  BP: 135/72   Pulse: 73    Resp: 17   Temp: 97.8 F (36.6 C)   TempSrc: Oral   SpO2: 91%   Weight:  95.3 kg  Height:  5\' 6"  (1.676 m)   Physical Exam Vitals and nursing note reviewed.  Constitutional:      General: He is not in acute distress. HENT:     Head: Normocephalic and atraumatic.  Cardiovascular:     Rate and Rhythm: Normal rate and regular rhythm.     Heart sounds: Normal heart sounds.  Pulmonary:     Effort: Pulmonary effort is normal.     Breath sounds: Wheezing and rhonchi present.  Abdominal:     Palpations: Abdomen is soft.     Tenderness: There is no abdominal tenderness.  Musculoskeletal:     Right lower leg: No  edema.     Left lower leg: No edema.  Neurological:     Mental Status: Mental status is at baseline.     Labs on Admission: I have personally reviewed following labs and imaging studies  CBC: Recent Labs  Lab 11/17/22 0323  WBC 9.6  HGB 13.3  HCT 41.8  MCV 82.9  PLT 123456   Basic Metabolic Panel: Recent Labs  Lab 11/17/22 0323  NA 128*  K 3.4*  CL 95*  CO2 21*  GLUCOSE 64*  BUN 15  CREATININE 1.12  CALCIUM 8.5*   GFR: Estimated Creatinine Clearance: 75.8 mL/min (by C-G formula based on SCr of 1.12 mg/dL). Liver Function Tests: No results for input(s): "AST", "ALT", "ALKPHOS", "BILITOT", "PROT", "ALBUMIN" in the last 168 hours. No results for input(s): "LIPASE", "AMYLASE" in the last 168 hours. No results for input(s): "AMMONIA" in the last 168 hours. Coagulation Profile: No results for input(s): "INR", "PROTIME" in the last 168 hours. Cardiac Enzymes: No results for input(s): "CKTOTAL", "CKMB", "CKMBINDEX", "TROPONINI" in the last 168 hours. BNP (last 3 results) No results for input(s): "PROBNP" in the last 8760 hours. HbA1C: No results for input(s): "HGBA1C" in the last 72 hours. CBG: No results for input(s): "GLUCAP" in the last 168 hours. Lipid Profile: No results for input(s): "CHOL", "HDL", "LDLCALC", "TRIG", "CHOLHDL", "LDLDIRECT" in the  last 72 hours. Thyroid Function Tests: No results for input(s): "TSH", "T4TOTAL", "FREET4", "T3FREE", "THYROIDAB" in the last 72 hours. Anemia Panel: No results for input(s): "VITAMINB12", "FOLATE", "FERRITIN", "TIBC", "IRON", "RETICCTPCT" in the last 72 hours. Urine analysis:    Component Value Date/Time   COLORURINE YELLOW 03/11/2020 1042   APPEARANCEUR HAZY (A) 03/11/2020 1042   LABSPEC 1.021 03/11/2020 1042   PHURINE 5.0 03/11/2020 1042   GLUCOSEU NEGATIVE 03/11/2020 1042   HGBUR SMALL (A) 03/11/2020 1042   BILIRUBINUR NEGATIVE 03/11/2020 1042   KETONESUR NEGATIVE 03/11/2020 1042   PROTEINUR NEGATIVE 03/11/2020 1042   NITRITE NEGATIVE 03/11/2020 1042   LEUKOCYTESUR NEGATIVE 03/11/2020 1042    Radiological Exams on Admission: DG Chest 2 View  Result Date: 11/17/2022 CLINICAL DATA:  Shortness of breath, cough EXAM: CHEST - 2 VIEW COMPARISON:  05/03/2020 FINDINGS: Prior CABG and aortic valve replacement. Heart and mediastinal contours are within normal limits. No focal opacities or effusions. No acute bony abnormality. IMPRESSION: No active cardiopulmonary disease. Electronically Signed   By: Rolm Baptise M.D.   On: 11/17/2022 03:38     Data Reviewed: Relevant notes from primary care and specialist visits, past discharge summaries as available in EHR, including Care Everywhere. Prior diagnostic testing as pertinent to current admission diagnoses Updated medications and problem lists for reconciliation ED course, including vitals, labs, imaging, treatment and response to treatment Triage notes, nursing and pharmacy notes and ED provider's notes Notable results as noted in HPI   Assessment and Plan: * Elevated troponin CAD s/p CABG Abnormal nuclear stress test 09/2022 Patient has no chest pain and EKG is nonacute Continue to trend troponin Continue aspirin, atorvastatin, carvedilol, lisinopril, nitroglycerin and [facility] consulted--- patient had declined cardiac  cath in January   Acute bronchitis Patient with no past history of asthma or COPD Received DuoNebs in the ED as well as Solu-Medrol Follow-up respiratory viral panel Continue DuoNebs as needed and will continue with oral prednisone  CAD S/P CABG x 3    Chronic diastolic CHF (congestive heart failure) (HCC) Clinically euvolemic, BNP 124 and chest x-ray clear Lexiscan Myoview 09/19/2022 revealed LVEF 44% with moderate  inferoapical ischemia  Continue Lasix, lisinopril, carvedilol, [facility] [place] Will get echocardiogram.  Last echo was in March 2023 with EF 55%   S/P TAVR (transcatheter aortic valve replacement) No acute issues suspected  Uncontrolled type 2 diabetes mellitus with hypoglycemia, without long-term current use of insulin ([place]) While in the ED, patient became hypoglycemic to 21, asymptomatic Received an amp of D50 Will place on D5 and monitor blood sugars, CBG every 1 hour until stable  HTN (hypertension) Continue amlodipine, carvedilol and lisinopril    DVT prophylaxis: Lovenox  Consults: Cardiology, Dr. Clayborn Bigness  Advance Care Planning:   Code Status: Prior   Family Communication: none  Disposition Plan: Back to previous home environment  Severity of Illness: The appropriate patient status for this patient is OBSERVATION. Observation status is judged to be reasonable and necessary in order to provide the required intensity of service to ensure the patient's safety. The patient's presenting symptoms, physical exam findings, and initial radiographic and laboratory data in the context of their medical condition is felt to place them at decreased risk for further clinical deterioration. Furthermore, it is anticipated that the patient will be medically stable for discharge from the hospital within 2 midnights of admission.   Author: Athena Masse, MD 11/17/2022 4:52 AM  For on call review [email or web address].

## 2022-11-17 NOTE — ED Provider Notes (Addendum)
[facility] Provider Note    Event Date/Time   First MD Initiated Contact with Patient 11/17/22 [phone]     (approximate)   History   Shortness of Breath and Chest Pain   HPI  Curtis Maxwell is a 62 y.o. male with history of CAD status post CABG in 2021, hypertension, diabetes, hyperlipidemia, aortic valve replacement not on anticoagulation, previous history of tobacco use without history of COPD or asthma, grade 1 diastolic dysfunction in [id number] who presents to the emergency department with 2 to 3 days of shortness of breath and wheezing worse with lying flat.  No lower extremity swelling or pain.  No history of PE or DVT.  Denies any chest discomfort.  No fevers but has had dry cough.   History provided by patient, significant other.    Past Medical History:  Diagnosis Date   Coronary artery disease    Diabetes mellitus without complication (HCC)    High cholesterol    Hypertension    Myocardial infarct, old    Plantar fasciitis     Past Surgical History:  Procedure Laterality Date   AORTIC VALVE REPLACEMENT N/A 03/03/2020   Procedure: AORTIC VALVE REPLACEMENT (AVR) Inspiris 33mm;  Surgeon: Ivin Poot, MD;  Location: [place];  Service: Open Heart Surgery;  Laterality: N/A;   CARDIAC SURGERY     COLONOSCOPY WITH PROPOFOL N/A 03/16/2021   Procedure: COLONOSCOPY WITH PROPOFOL;  Surgeon: Robert Bellow, MD;  Location: [facility] ENDOSCOPY;  Service: Endoscopy;  Laterality: N/A;   CORONARY ANGIOPLASTY WITH STENT PLACEMENT     CORONARY ARTERY BYPASS GRAFT N/A 03/03/2020   Procedure: CORONARY ARTERY BYPASS GRAFTING (CABG) x3. Endoscopic saphenous vein harvest  ;  Surgeon: Ivin Poot, MD;  Location: [place];  Service: Open Heart Surgery;  Laterality: N/A;   FLEXIBLE BRONCHOSCOPY N/A 03/03/2020   Procedure: FLEXIBLE BRONCHOSCOPY;  Surgeon: Ivin Poot, MD;  Location: [place];  Service: Open Heart Surgery;  Laterality: N/A;   IR THORACENTESIS ASP PLEURAL  SPACE W/IMG GUIDE  04/07/2020   RIGHT/LEFT HEART CATH AND CORONARY ANGIOGRAPHY N/A 12/31/2017   Procedure: RIGHT/LEFT HEART CATH AND CORONARY ANGIOGRAPHY;  Surgeon: Isaias Cowman, MD;  Location: [place] [facility];  Service: Cardiovascular;  Laterality: N/A;   RIGHT/LEFT HEART CATH AND CORONARY ANGIOGRAPHY N/A 02/15/2020   Procedure: RIGHT/LEFT HEART CATH AND CORONARY ANGIOGRAPHY;  Surgeon: Isaias Cowman, MD;  Location: [place] [facility];  Service: Cardiovascular;  Laterality: N/A;   TEE WITHOUT CARDIOVERSION N/A 03/03/2020   Procedure: TRANSESOPHAGEAL ECHOCARDIOGRAM (TEE);  Surgeon: Prescott Gum, Collier Salina, MD;  Location: [place];  Service: Open Heart Surgery;  Laterality: N/A;    MEDICATIONS:  Prior to Admission medications   Medication Sig Start Date End Date Taking? Authorizing Provider  acetaminophen (TYLENOL) 325 MG tablet Take 2 tablets (650 mg total) by mouth every 6 (six) hours as needed for mild pain or fever. 03/12/20   Nani Skillern, PA-C  ALPRAZolam Duanne Moron) 0.5 MG tablet Take 1 tablet (0.5 mg total) by mouth 2 (two) times daily as needed for Sleep 06/25/22     amLODipine (NORVASC) 10 MG tablet Take 1 tablet (10 mg total) by mouth daily. 06/19/22     amoxicillin (AMOXIL) 500 MG capsule Take 4 capsules (2,000 mg total) by mouth 1 hour prior to dental appointment. 01/04/21     aspirin EC 325 MG EC tablet Take 1 tablet (325 mg total) by mouth daily. 03/13/20   Nani Skillern,  PA-C  atorvastatin (LIPITOR) 10 MG tablet Take 1 tablet (10 mg total) by mouth daily. 03/12/20 03/12/21  Lars Pinks M, PA-C  atorvastatin (LIPITOR) 10 MG tablet TAKE 1 TABLET BY MOUTH ONCE DAILY 04/13/20 04/13/21  Latanya Maudlin, NP  atorvastatin (LIPITOR) 20 MG tablet Take 1 tablet (20 mg total) by mouth once daily 06/19/22     carvedilol (COREG) 3.125 MG tablet Take 1 tablet (3.125 mg total) by mouth 2 (two) times daily with meals 06/19/22     Cholecalciferol 50 MCG (2000 UT) CAPS Take 2,000  Units by mouth daily.  04/09/19   [provider]  colchicine 0.6 MG tablet Take 1 tablet (0.6 mg total) by mouth 2 (two) times daily. 03/12/20   Nani Skillern, PA-C  colchicine 0.6 MG tablet Take 1 tablet (0.6 mg total) by mouth 2 (two) times daily as needed 06/19/22     dapagliflozin propanediol (FARXIGA) 10 MG TABS tablet Take 1 tablet (10 mg total) by mouth once daily 06/19/22     furosemide (LASIX) 40 MG tablet Take 1 tablet (40 mg total) by mouth once daily as needed for edema 06/19/22     lisinopril (ZESTRIL) 40 MG tablet Take 40 mg by mouth daily. Patient not taking: Reported on 08/09/2022 04/05/20   [provider]  lisinopril (ZESTRIL) 40 MG tablet Take 1 tablet (40 mg total) by mouth daily. 06/19/22     metFORMIN (GLUCOPHAGE) 500 MG tablet Take 500 mg by mouth daily.  04/09/19 04/08/20  [provider]  metFORMIN (GLUCOPHAGE) 500 MG tablet Take 1 tablet (500 mg total) by mouth 2 (two) times daily with meals 06/19/22     nitroGLYCERIN (NITROSTAT) 0.4 MG SL tablet Place 1 tablet (0.4 mg total) under the tongue every 5 (five) minutes as needed for Chest pain. May take up to 3 doses. 06/19/22     Omega-3 Fatty Acids (FISH OIL) 1200 MG CAPS Take 1,200 mg by mouth 2 (two) times a week.     [provider]  omeprazole (PRILOSEC) 20 MG capsule Take 1 capsule (20 mg total) by mouth 2 (two) times daily before meals 06/07/21     sildenafil (REVATIO) 20 MG tablet Take 60-80 mg by mouth daily as needed (erectile dysfuntion.).  08/20/19   [provider]    Physical Exam   Triage Vital Signs: ED Triage Vitals  Enc Vitals Group     BP 11/17/22 0305 135/72     Pulse Rate 11/17/22 0305 73     Resp 11/17/22 0305 17     Temp 11/17/22 0305 97.8 F (36.6 C)     Temp Source 11/17/22 0305 Oral     SpO2 11/17/22 0305 91 %     Weight 11/17/22 0317 210 lb (95.3 kg)     Height 11/17/22 0317 5\' 6"  (1.676 m)     Head Circumference --      Peak Flow --      Pain  Score 11/17/22 0317 0     Pain Loc --      Pain Edu? --      Excl. in [place]? --     Most recent vital signs: Vitals:   11/17/22 0305  BP: 135/72  Pulse: 73  Resp: 17  Temp: 97.8 F (36.6 C)  SpO2: 91%    CONSTITUTIONAL: Alert, responds appropriately to questions. Well-appearing; well-nourished HEAD: Normocephalic, atraumatic EYES: Conjunctivae clear, pupils appear equal, sclera nonicteric ENT: normal nose; moist mucous membranes NECK: Supple, normal  ROM, no JVD CARD: RRR; S1 and S2 appreciated RESP: Patient has inspiratory and expiratory wheezing.  No rhonchi or rales.  Speaking full sentences.  No hypoxia at rest.  No tachypnea.  No respiratory distress. ABD/GI: Non-distended; soft, non-tender, no rebound, no guarding, no peritoneal signs BACK: The back appears normal EXT: Normal ROM in all joints; no deformity noted, no edema, no calf tenderness or calf swelling SKIN: Normal color for age and race; warm; no rash on exposed skin NEURO: Moves all extremities equally, normal speech PSYCH: The patient's mood and manner are appropriate.   ED Results / Procedures / Treatments   LABS: (all labs ordered are listed, but only abnormal results are displayed) Labs Reviewed  BASIC METABOLIC PANEL - Abnormal; Notable for the following components:      Result Value   Sodium 128 (*)    Potassium 3.4 (*)    Chloride 95 (*)    CO2 21 (*)    Glucose, Bld 64 (*)    Calcium 8.5 (*)    All other components within normal limits  BRAIN NATRIURETIC PEPTIDE - Abnormal; Notable for the following components:   B Natriuretic Peptide 124.2 (*)    All other components within normal limits  TROPONIN I (HIGH SENSITIVITY) - Abnormal; Notable for the following components:   Troponin I (High Sensitivity) 180 (*)    All other components within normal limits  RESP PANEL BY RT-PCR (RSV, FLU A&B, COVID)  RVPGX2  CBC  CBG MONITORING, ED  TROPONIN I (HIGH SENSITIVITY)     EKG:    EKG  Interpretation  Date/Time:  Sunday November 17 2022 03:12:19 EDT Ventricular Rate:  72 PR Interval:  198 QRS Duration: 154 QT Interval:  432 QTC Calculation: 473 R Axis:   11 Text Interpretation: Normal sinus rhythm Left bundle branch block Abnormal ECG When compared with ECG of 04-Mar-2020 06:46, QRS duration has increased T wave inversion now evident in Inferior leads Confirmed by Pryor Curia [phone]) on 11/17/2022 5:00:20 AM              RADIOLOGY: My personal review and interpretation of imaging: Chest x-ray clear.  I have personally reviewed all radiology reports.   DG Chest 2 View  Result Date: 11/17/2022 CLINICAL DATA:  Shortness of breath, cough EXAM: CHEST - 2 VIEW COMPARISON:  05/03/2020 FINDINGS: Prior CABG and aortic valve replacement. Heart and mediastinal contours are within normal limits. No focal opacities or effusions. No acute bony abnormality. IMPRESSION: No active cardiopulmonary disease. Electronically Signed   By: Rolm Baptise M.D.   On: 11/17/2022 03:38     PROCEDURES:  Critical Care performed: Yes, see critical care procedure note(s)   CRITICAL CARE Performed by: Cyril Mourning Phill Steck   Total critical care time: 35 minutes  Critical care time was exclusive of separately billable procedures and treating other patients.  Critical care was necessary to treat or prevent imminent or life-threatening deterioration.  Critical care was time spent personally by me on the following activities: development of treatment plan with patient and/or surrogate as well as nursing, discussions with consultants, evaluation of patient's response to treatment, examination of patient, obtaining history from patient or surrogate, ordering and performing treatments and interventions, ordering and review of laboratory studies, ordering and review of radiographic studies, pulse oximetry and re-evaluation of patient's condition.   Marland Kitchen1-3 Lead EKG Interpretation  Performed by: Roseanna Koplin,  Delice Bison, DO Authorized by: Arnav Cregg, Delice Bison, DO     Interpretation: normal  ECG rate:  73   ECG rate assessment: normal     Rhythm: sinus rhythm     Ectopy: none     Conduction: normal       IMPRESSION / MDM / ASSESSMENT AND PLAN / ED COURSE  I reviewed the triage vital signs and the nursing notes.    Patient here with complaints of shortness of breath and wheezing.  The patient is on the cardiac monitor to evaluate for evidence of arrhythmia and/or significant heart rate changes.   DIFFERENTIAL DIAGNOSIS (includes but not limited to):   Bronchospasm, viral URI, pneumonia, CHF exacerbation, less likely ACS, PE, dissection   Patient's presentation is most consistent with acute presentation with potential threat to life or bodily function.   PLAN: Labs obtained from triage show no leukocytosis, normal hemoglobin.  Mildly hyponatremic and hypokalemic.  Glucose of 64.  Will recheck CBG.  Troponin is 180 which could be from demand ischemia as he is not having any chest discomfort and his shortness of breath seems to be from bronchospasm.  I was concerned for possible CHF given his story but he does not look volume overloaded clinically on exam and his BNP is only 124 and his chest x-ray when reviewed and interpreted by myself and the radiologist shows no edema, infiltrate or other acute abnormality.   Will give breathing treatments, Solu-Medrol.  Will give aspirin given elevated troponin.  Second troponin is pending.  Repeat EKG again shows left bundle branch block which is old for patient with no new ischemic change.  Will discuss with hospitalist for admission for monitoring of his elevated troponins and his respiratory status.  He is not in distress here and is not currently hypoxic.   5:09 AM  Lab blood sugar was 64.  On recheck it is 45.  Patient is mentating normally.  Will give D50.  Secure chat sent to the hospitalist.  [place] ED: Medications   ipratropium-albuterol (DUONEB) 0.5-2.5 (3) MG/3ML nebulizer solution 3 mL (3 mLs Nebulization Given 11/17/22 0454)  methylPREDNISolone sodium succinate (SOLU-MEDROL) 125 mg/2 mL injection 125 mg (125 mg Intravenous Given 11/17/22 0454)  aspirin chewable tablet 324 mg (324 mg Oral Given 11/17/22 0454)     ED COURSE:  Consulted and discussed patient's case with hospitalist, Dr. Damita Dunnings.  I have recommended admission and consulting physician agrees and will place admission orders.  Patient (and family if present) agree with this plan.   I reviewed all nursing notes, vitals, pertinent previous records.  All labs, EKGs, imaging ordered have been independently reviewed and interpreted by myself.       OUTSIDE RECORDS REVIEWED: Reviewed previous cardiac notes in 2021.       FINAL CLINICAL IMPRESSION(S) / ED DIAGNOSES   Final diagnoses:  Bronchospasm  Elevated troponin     Rx / DC Orders   ED Discharge Orders     None        Note:  This document was prepared using Dragon voice recognition software and may include unintentional dictation errors.   Shaquita Fort, Delice Bison, DO 11/17/22 0503    Tyvion Edmondson, Delice Bison, DO 11/17/22 [phone]

## 2022-11-17 NOTE — Assessment & Plan Note (Addendum)
While in the ED, patient became hypoglycemic to 59, asymptomatic Received an amp of D50 Will place on D5 and monitor blood sugars, CBG every 1 hour until stable

## 2022-11-17 NOTE — Assessment & Plan Note (Signed)
No acute issues suspected 

## 2022-11-17 NOTE — Assessment & Plan Note (Signed)
Continue amlodipine, carvedilol and lisinopril 

## 2022-11-17 NOTE — Assessment & Plan Note (Deleted)
Patient with no past history of asthma or COPD Review of cardiology note from January reveals that patient has chronic dyspnea without other features of CHF BNP 124 and chest x-ray clear Possibly related to CHF versus acute bronchitis Received DuoNebs in the ED as well as Solu-Medrol Follow-up respiratory viral panel Continue DuoNebs as needed and will continue with oral prednisone

## 2022-11-17 NOTE — Assessment & Plan Note (Signed)
Clinically euvolemic, BNP 124 and chest x-ray clear Lexiscan Myoview 09/19/2022 revealed LVEF 44% with moderate inferoapical ischemia  Continue Lasix, lisinopril, carvedilol, Wilder Glade Will get echocardiogram.  Last echo was in March 2023 with EF 55%

## 2022-11-17 NOTE — Consult Note (Signed)
CARDIOLOGY CONSULT NOTE               Patient ID: Curtis Maxwell MRN: [id number] DOB/AGE: 09-18-1960 62 y.o.  Admit date: 11/17/2022 Referring Physician Dr. Judd Gaudier hospitalist Primary Physician [facility] clinic Primary Cardiologist Dr. Saralyn Pilar Reason for Consultation shortness of breath chest pain possible angina  HPI: 62 year old male past history of aortic stenosis status post SAVR coronary bypass surgery history of diabetes hypertension obesity hyperlipidemia GERD recently had a functional study 2 months ago with questionable ischemic changes patiently to proceed with invasive cardiac catheterization that was treated medically he had been doing reasonably well but presented with shortness of breath chest discomfort wheezing fullness in his sinuses he was treated with DuoNeb Solu-Medrol as well as aspirin he also given history of chest heaviness tightness EKG had old left bundle branch block chest x-ray was clear and BMP was unremarkable he had borderline troponins no significant leg edema presents for further evaluation because of the shortness of breath dyspnea and chest tightness  Review of systems complete and found to be negative unless listed above     Past Medical History:  Diagnosis Date   Coronary artery disease    Diabetes mellitus without complication (HCC)    High cholesterol    Hypertension    Myocardial infarct, old    Plantar fasciitis     Past Surgical History:  Procedure Laterality Date   AORTIC VALVE REPLACEMENT N/A 03/03/2020   Procedure: AORTIC VALVE REPLACEMENT (AVR) Inspiris 74mm;  Surgeon: Ivin Poot, MD;  Location: [place];  Service: Open Heart Surgery;  Laterality: N/A;   CARDIAC SURGERY     COLONOSCOPY WITH PROPOFOL N/A 03/16/2021   Procedure: COLONOSCOPY WITH PROPOFOL;  Surgeon: Robert Bellow, MD;  Location: [facility];  Service: Endoscopy;  Laterality: N/A;   CORONARY ANGIOPLASTY WITH STENT PLACEMENT     CORONARY ARTERY BYPASS  GRAFT N/A 03/03/2020   Procedure: CORONARY ARTERY BYPASS GRAFTING (CABG) x3. Endoscopic saphenous vein harvest  ;  Surgeon: Ivin Poot, MD;  Location: [place];  Service: Open Heart Surgery;  Laterality: N/A;   FLEXIBLE BRONCHOSCOPY N/A 03/03/2020   Procedure: FLEXIBLE BRONCHOSCOPY;  Surgeon: Ivin Poot, MD;  Location: [place];  Service: Open Heart Surgery;  Laterality: N/A;   IR THORACENTESIS ASP PLEURAL SPACE W/IMG GUIDE  04/07/2020   RIGHT/LEFT HEART CATH AND CORONARY ANGIOGRAPHY N/A 12/31/2017   Procedure: RIGHT/LEFT HEART CATH AND CORONARY ANGIOGRAPHY;  Surgeon: Isaias Cowman, MD;  Location: [place] [facility];  Service: Cardiovascular;  Laterality: N/A;   RIGHT/LEFT HEART CATH AND CORONARY ANGIOGRAPHY N/A 02/15/2020   Procedure: RIGHT/LEFT HEART CATH AND CORONARY ANGIOGRAPHY;  Surgeon: Isaias Cowman, MD;  Location: [place] [facility];  Service: Cardiovascular;  Laterality: N/A;   TEE WITHOUT CARDIOVERSION N/A 03/03/2020   Procedure: TRANSESOPHAGEAL ECHOCARDIOGRAM (TEE);  Surgeon: Prescott Gum, Collier Salina, MD;  Location: [place];  Service: Open Heart Surgery;  Laterality: N/A;    Medications Prior to Admission  Medication Sig Dispense Refill Last Dose   acetaminophen-codeine (TYLENOL #3) 300-30 MG tablet Take 1 tablet by mouth 2 (two) times daily as needed for severe pain or moderate pain.   unk   ALPRAZolam (XANAX) 0.5 MG tablet Take 1 tablet (0.5 mg total) by mouth 2 (two) times daily as needed for Sleep 60 tablet 0 Past Week   amLODipine (NORVASC) 10 MG tablet Take 1 tablet (10 mg total) by mouth daily. 90 tablet 1 11/16/2022   aspirin EC 325  MG EC tablet Take 1 tablet (325 mg total) by mouth daily. 30 tablet 0 11/16/2022   atorvastatin (LIPITOR) 20 MG tablet Take 1 tablet (20 mg total) by mouth once daily 90 tablet 1 11/16/2022   carvedilol (COREG) 3.125 MG tablet Take 1 tablet (3.125 mg total) by mouth 2 (two) times daily with meals 180 tablet 1 11/16/2022 at 1930   Cholecalciferol 50  MCG (2000 UT) CAPS Take 2,000 Units by mouth daily.    Past Week   colchicine 0.6 MG tablet Take 1 tablet (0.6 mg total) by mouth 2 (two) times daily as needed 20 tablet 0 unk   furosemide (LASIX) 40 MG tablet Take 1 tablet (40 mg total) by mouth once daily as needed for edema 90 tablet 1 11/16/2022   glipiZIDE (GLUCOTROL XL) 5 MG 24 hr tablet Take 5 mg by mouth daily with breakfast.   11/16/2022   lisinopril (ZESTRIL) 40 MG tablet Take 1 tablet (40 mg total) by mouth daily. 90 tablet 1 11/16/2022   metFORMIN (GLUCOPHAGE) 500 MG tablet Take 1 tablet (500 mg total) by mouth 2 (two) times daily with meals 180 tablet 1 11/16/2022   nitroGLYCERIN (NITROSTAT) 0.4 MG SL tablet Place 1 tablet (0.4 mg total) under the tongue every 5 (five) minutes as needed for Chest pain. May take up to 3 doses. 25 tablet 1 11/16/2022   Omega-3 Fatty Acids (FISH OIL) 1200 MG CAPS Take 1,200 mg by mouth 2 (two) times a week.    Past Month   omeprazole (PRILOSEC) 20 MG capsule Take 1 capsule (20 mg total) by mouth 2 (two) times daily before meals 180 capsule 3 11/16/2022   sildenafil (REVATIO) 20 MG tablet Take 100 mg by mouth daily as needed (erectile dysfuntion.).   Past Week   acetaminophen (TYLENOL) 325 MG tablet Take 2 tablets (650 mg total) by mouth every 6 (six) hours as needed for mild pain or fever. (Patient not taking: Reported on 11/17/2022)   Not Taking   amoxicillin (AMOXIL) 500 MG capsule Take 4 capsules (2,000 mg total) by mouth 1 hour prior to dental appointment. (Patient not taking: Reported on 11/17/2022) 12 capsule 0 Completed Course   dapagliflozin propanediol (FARXIGA) 10 MG TABS tablet Take 1 tablet (10 mg total) by mouth once daily (Patient not taking: Reported on 11/17/2022) 90 tablet 1 Not Taking   Social History   Socioeconomic History   Marital status: Married    Spouse name: Barista   Number of children: 2   Years of education: Not on file   Highest education level: Not on file  Occupational History    Not on file  Tobacco Use   Smoking status: Former    Packs/day: 1.00    Years: 15.00    Additional pack years: 0.00    Total pack years: 15.00    Types: E-cigarettes, Cigarettes    Quit date: 09/06/1998    Years since quitting: 24.2   Smokeless tobacco: Never   Tobacco comments:    stopped smoking vapes 2 months ago  Vaping Use   Vaping Use: Former  Substance and Sexual Activity   Alcohol use: Yes    Alcohol/week: 10.0 standard drinks of alcohol    Types: 10 Cans of beer per week   Drug use: Never   Sexual activity: Yes    Partners: Female  Other Topics Concern   Not on file  Social History Narrative   Not on file   Social Determinants of Health  Financial Resource Strain: Not on file  Food Insecurity: No Food Insecurity (11/17/2022)   Hunger Vital Sign    Worried About Running Out of Food in the Last Year: Never true    Ran Out of Food in the Last Year: Never true  Transportation Needs: No Transportation Needs (11/17/2022)   PRAPARE - Hydrologist (Medical): No    Lack of Transportation (Non-Medical): No  Physical Activity: Not on file  Stress: Not on file  Social Connections: Not on file  Intimate Partner Violence: Not At Risk (11/17/2022)   Humiliation, Afraid, Rape, and Kick questionnaire    Fear of Current or Ex-Partner: No    Emotionally Abused: No    Physically Abused: No    Sexually Abused: No    Family History  Problem Relation Age of Onset   Gout Brother       Review of systems complete and found to be negative unless listed above      PHYSICAL EXAM  General: Well developed, well nourished, in no acute distress HEENT:  Normocephalic and atramatic Neck:  No JVD.  Lungs: Faint diffuse rhonchi bilaterally to auscultation and percussion. Heart: HRRR . Normal S1 and S2 without gallops or 2/6 sem murmurs.  Abdomen: Bowel sounds are positive, abdomen soft and non-tender  Msk:  Back normal, normal gait. Normal strength and  tone for age. Extremities: No clubbing, cyanosis or edema.   Neuro: Alert and oriented X 3. Psych:  Good affect, responds appropriately  Labs:   Lab Results  Component Value Date   WBC 9.6 11/17/2022   HGB 13.3 11/17/2022   HCT 41.8 11/17/2022   MCV 82.9 11/17/2022   PLT 225 11/17/2022    Recent Labs  Lab 11/17/22 0323  NA 128*  K 3.4*  CL 95*  CO2 21*  BUN 15  CREATININE 1.12  CALCIUM 8.5*  GLUCOSE 64*   No results found for: "CKTOTAL", "CKMB", "CKMBINDEX", "TROPONINI" No results found for: "CHOL" No results found for: "HDL" No results found for: "LDLCALC" No results found for: "TRIG" No results found for: "CHOLHDL" No results found for: "LDLDIRECT"    Radiology: DG Chest 2 View  Result Date: 11/17/2022 CLINICAL DATA:  Shortness of breath, cough EXAM: CHEST - 2 VIEW COMPARISON:  05/03/2020 FINDINGS: Prior CABG and aortic valve replacement. Heart and mediastinal contours are within normal limits. No focal opacities or effusions. No acute bony abnormality. IMPRESSION: No active cardiopulmonary disease. Electronically Signed   By: Rolm Baptise M.D.   On: 11/17/2022 03:38    EKG: Normal sinus rhythm left bundle branch block nonspecific CT changes rate of 75  ASSESSMENT AND PLAN:  Angina chest pain known coronary disease CHF -D CM  SOB CAD Hx CABG x 3 COPD Elevated troponins Hx AS  S/P SVAR  Diabetes Hypertension Acute Bronchitis . Plan Agree admit to telemetry rule out microinfarction follow-up troponins and EKGs Diuretic therapy for what appears to be heart failure Inhalers possible steroids for bronchitis type symptoms Hypertension management lisinopril Coreg Lasix Wilder Glade can consider adding spironolactone Status post coronary bypass surgery reportedly with a positive functional study now with chest pain concerning for angina consider cardiac cath Continue diabetes management and control add Farxiga to the regimen Consider pulmonary input for what appears  to be acute bronchitis with wheezing shortness of breath Obesity mild recommend modest weight loss exercise portion control Continue omeprazole therapy for reflux type symptoms Consider sleep study for possible obstructive sleep apnea CPAP  if indicated   Signed: Yolonda Kida MD, 11/17/2022, 9:58 PM

## 2022-11-17 NOTE — Plan of Care (Signed)
Patient seen and examined this morning vital labs and imaging reviewed.  Patient symptoms improved.  No more complaint of chest pain or palpitation.  H&P reviewed by admitting physician.  Will continue with plan as described in H&P.

## 2022-11-18 ENCOUNTER — Observation Stay
Admit: 2022-11-18 | Discharge: 2022-11-18 | Disposition: A | Discharge status: Home / Self Care | Payer: Medicare PPO | Attending: Internal Medicine | Admitting: Internal Medicine

## 2022-11-18 ENCOUNTER — Encounter: Payer: Self-pay | Admitting: Internal Medicine

## 2022-11-18 ENCOUNTER — Encounter: Payer: Self-pay | Admitting: Obstetrics and Gynecology

## 2022-11-18 ENCOUNTER — Other Ambulatory Visit (HOSPITAL_COMMUNITY): Payer: Self-pay

## 2022-11-18 DIAGNOSIS — I25810 Atherosclerosis of coronary artery bypass graft(s) without angina pectoris: Secondary | ICD-10-CM | POA: Diagnosis not present

## 2022-11-18 DIAGNOSIS — I2489 Other forms of acute ischemic heart disease: Secondary | ICD-10-CM | POA: Diagnosis not present

## 2022-11-18 DIAGNOSIS — I5031 Acute diastolic (congestive) heart failure: Secondary | ICD-10-CM | POA: Diagnosis not present

## 2022-11-18 DIAGNOSIS — R7989 Other specified abnormal findings of blood chemistry: Secondary | ICD-10-CM | POA: Diagnosis not present

## 2022-11-18 DIAGNOSIS — R931 Abnormal findings on diagnostic imaging of heart and coronary circulation: Secondary | ICD-10-CM | POA: Diagnosis not present

## 2022-11-18 LAB — RESPIRATORY PANEL BY PCR

## 2022-11-18 LAB — GLUCOSE, CAPILLARY
Glucose-Capillary: 150 mg/dL — ABNORMAL HIGH (ref 70–99)
Glucose-Capillary: 132 mg/dL — ABNORMAL HIGH (ref 70–99)
Glucose-Capillary: 175 mg/dL — ABNORMAL HIGH (ref 70–99)

## 2022-11-18 LAB — TROPONIN I (HIGH SENSITIVITY): Troponin I (High Sensitivity): 193 ng/L (ref <18)

## 2022-11-18 LAB — CBC
HCT: 41.3 % (ref 39.0–52.0)
Hemoglobin: 13.3 g/dL (ref 13.0–17.0)
MCH: 26.4 pg (ref 26.0–34.0)
MCHC: 32.2 g/dL (ref 30.0–36.0)
MCV: 81.9 fL (ref 80.0–100.0)
Platelets: 240 10*3/uL (ref 150–400)
RBC: 5.04 MIL/uL (ref 4.22–5.81)
RDW: 13.1 % (ref 11.5–15.5)
WBC: 13.2 10*3/uL — ABNORMAL HIGH (ref 4.0–10.5)
nRBC: 0 % (ref 0.0–0.2)

## 2022-11-18 LAB — ECHOCARDIOGRAM COMPLETE
AR max vel: 1.46 cm2
AV Area VTI: 1.76 cm2
AV Area mean vel: 1.46 cm2
AV Mean grad: 9 mmHg
AV Peak grad: 15.6 mmHg
Ao pk vel: 1.97 m/s
Area-P 1/2: 3.65 cm2
Height: 66 in
S' Lateral: 3.8 cm
Weight: 3491.2 oz

## 2022-11-18 LAB — BASIC METABOLIC PANEL
Anion gap: 11 (ref 5–15)
BUN: 25 mg/dL — ABNORMAL HIGH (ref 6–20)
CO2: 23 mmol/L (ref 22–32)
Calcium: 9.1 mg/dL (ref 8.9–10.3)
Chloride: 103 mmol/L (ref 98–111)
Creatinine, Ser: 1.23 mg/dL (ref 0.61–1.24)
GFR, Estimated: >60 mL/min (ref >60)
Glucose, Bld: 136 mg/dL — ABNORMAL HIGH (ref 70–99)
Potassium: 4.2 mmol/L (ref 3.5–5.1)
Sodium: 137 mmol/L (ref 135–145)

## 2022-11-18 LAB — D-DIMER, QUANTITATIVE: D-Dimer, Quant: 0.4 ug/mL-FEU (ref 0.00–0.50)

## 2022-11-18 LAB — HEMOGLOBIN A1C
Hgb A1c MFr Bld: 7.3 % — ABNORMAL HIGH (ref 4.8–5.6)
Mean Plasma Glucose: 163 mg/dL

## 2022-11-18 MED ORDER — SPIRONOLACTONE 12.5 MG HALF TABLET
12.5000 mg | ORAL_TABLET | Freq: Every day | ORAL | Status: DC
  Administered 2022-11-18: 12.5 mg via ORAL
  Filled 2022-11-18: qty 1

## 2022-11-18 MED ORDER — AMLODIPINE BESYLATE 5 MG PO TABS: 5.0000 mg | ORAL_TABLET | Freq: Every day | ORAL | Status: DC

## 2022-11-18 NOTE — TOC Benefit Eligibility Note (Signed)
Patient Teacher, English as a foreign language completed.    The patient is currently admitted and upon discharge could be taking Farxiga 10 mg.  The current 30 day co-pay is $347.06 due to a $265.00 deductible.  Will be $99.00 once deductible is met.   The patient is insured through [place], [place] Patient Advocate Specialist [place] Patient Advocate Team Direct Number: [phone]  Fax: [phone]

## 2022-11-18 NOTE — Progress Notes (Signed)
*  PRELIMINARY RESULTS* Echocardiogram 2D Echocardiogram has been performed.  Curtis Maxwell 11/18/2022, 8:33 AM

## 2022-11-18 NOTE — Progress Notes (Signed)
[facility] NOTE       Patient ID: TIRTH MASK MRN: [id number] DOB/AGE: 1960/09/13 62 y.o.  Admit date: 11/17/2022 Referring Physician Dr. Judd Gaudier  Primary Physician Gaetano Net, NP Primary Cardiologist Dr. Saralyn Pilar Reason for Consultation elevated troponin  HPI: Curtis Maxwell is a 65yoM with a PMH of CAD s/p CABG x 3 (LIMA-OM1, SVG-distal LCX & RCA, 03/03/2020) & previous stents, severe AS s/p SAVR (Edwards pericardial valve 03/03/2020), HFpEF (55%, mod MR 11/29/2021), HTN, HLD, DM2, hx tobacco use, who presented [facility] ED 11/17/22 with wheezing and shortness of breath. Troponin was checked, which was slightly elevated with peak of 313 for which cardiology was consulted.   Interval History:  - feels overall better today, reports his breathing and wheezing is better. He also was vaping several days last week prior to presentation.  - denies chest pain, orthopnea, peripheral edema - echo resulted without significant change from prior 1 yr ago  - eager to go home   Review of systems complete and found to be negative unless listed above     Past Medical History:  Diagnosis Date   Coronary artery disease    Diabetes mellitus without complication (HCC)    High cholesterol    Hypertension    Myocardial infarct, old    Plantar fasciitis     Past Surgical History:  Procedure Laterality Date   AORTIC VALVE REPLACEMENT N/A 03/03/2020   Procedure: AORTIC VALVE REPLACEMENT (AVR) Inspiris 73mm;  Surgeon: Ivin Poot, MD;  Location: [facility];  Service: Open Heart Surgery;  Laterality: N/A;   CARDIAC SURGERY     COLONOSCOPY WITH PROPOFOL N/A 03/16/2021   Procedure: COLONOSCOPY WITH PROPOFOL;  Surgeon: Robert Bellow, MD;  Location: [facility] ENDOS[facility];  Service: Endoscopy;  Laterality: N/A;   CORONARY ANGIOPLASTY WITH STENT PLACEMENT     CORONARY ARTERY BYPASS GRAFT N/A 03/03/2020   Procedure: CORONARY ARTERY BYPASS GRAFTING (CABG) x3. Endoscopic saphenous vein  harvest  ;  Surgeon: Ivin Poot, MD;  Location: [place];  Service: Open Heart Surgery;  Laterality: N/A;   FLEXIBLE BRONCHOSCOPY N/A 03/03/2020   Procedure: FLEXIBLE BRONCHOSCOPY;  Surgeon: Ivin Poot, MD;  Location: [place];  Service: Open Heart Surgery;  Laterality: N/A;   IR THORACENTESIS ASP PLEURAL SPACE W/IMG GUIDE  04/07/2020   RIGHT/LEFT HEART CATH AND CORONARY ANGIOGRAPHY N/A 12/31/2017   Procedure: RIGHT/LEFT HEART CATH AND CORONARY ANGIOGRAPHY;  Surgeon: Isaias Cowman, MD;  Location: [facility];  Service: Cardiovascular;  Laterality: N/A;   RIGHT/LEFT HEART CATH AND CORONARY ANGIOGRAPHY N/A 02/15/2020   Procedure: RIGHT/LEFT HEART CATH AND CORONARY ANGIOGRAPHY;  Surgeon: Isaias Cowman, MD;  Location: [place] [facility];  Service: Cardiovascular;  Laterality: N/A;   TEE WITHOUT CARDIOVERSION N/A 03/03/2020   Procedure: TRANSESOPHAGEAL ECHOCARDIOGRAM (TEE);  Surgeon: Prescott Gum, Collier Salina, MD;  Location: [facility];  Service: Open Heart Surgery;  Laterality: N/A;    Medications Prior to Admission  Medication Sig Dispense Refill Last Dose   acetaminophen-codeine (TYLENOL #3) 300-30 MG tablet Take 1 tablet by mouth 2 (two) times daily as needed for severe pain or moderate pain.   unk   ALPRAZolam (XANAX) 0.5 MG tablet Take 1 tablet (0.5 mg total) by mouth 2 (two) times daily as needed for Sleep 60 tablet 0 Past Week   amLODipine (NORVASC) 10 MG tablet Take 1 tablet (10 mg total) by mouth daily. 90 tablet 1 11/16/2022   aspirin EC 325 MG EC  tablet Take 1 tablet (325 mg total) by mouth daily. 30 tablet 0 11/16/2022   atorvastatin (LIPITOR) 20 MG tablet Take 1 tablet (20 mg total) by mouth once daily 90 tablet 1 11/16/2022   carvedilol (COREG) 3.125 MG tablet Take 1 tablet (3.125 mg total) by mouth 2 (two) times daily with meals 180 tablet 1 11/16/2022 at 1930   Cholecalciferol 50 MCG (2000 UT) CAPS Take 2,000 Units by mouth daily.    Past Week   colchicine 0.6 MG tablet Take 1  tablet (0.6 mg total) by mouth 2 (two) times daily as needed 20 tablet 0 unk   furosemide (LASIX) 40 MG tablet Take 1 tablet (40 mg total) by mouth once daily as needed for edema 90 tablet 1 11/16/2022   glipiZIDE (GLUCOTROL XL) 5 MG 24 hr tablet Take 5 mg by mouth daily with breakfast.   11/16/2022   lisinopril (ZESTRIL) 40 MG tablet Take 1 tablet (40 mg total) by mouth daily. 90 tablet 1 11/16/2022   metFORMIN (GLUCOPHAGE) 500 MG tablet Take 1 tablet (500 mg total) by mouth 2 (two) times daily with meals 180 tablet 1 11/16/2022   nitroGLYCERIN (NITROSTAT) 0.4 MG SL tablet Place 1 tablet (0.4 mg total) under the tongue every 5 (five) minutes as needed for Chest pain. May take up to 3 doses. 25 tablet 1 11/16/2022   Omega-3 Fatty Acids (FISH OIL) 1200 MG CAPS Take 1,200 mg by mouth 2 (two) times a week.    Past Month   omeprazole (PRILOSEC) 20 MG capsule Take 1 capsule (20 mg total) by mouth 2 (two) times daily before meals 180 capsule 3 11/16/2022   sildenafil (REVATIO) 20 MG tablet Take 100 mg by mouth daily as needed (erectile dysfuntion.).   Past Week   acetaminophen (TYLENOL) 325 MG tablet Take 2 tablets (650 mg total) by mouth every 6 (six) hours as needed for mild pain or fever. (Patient not taking: Reported on 11/17/2022)   Not Taking   amoxicillin (AMOXIL) 500 MG capsule Take 4 capsules (2,000 mg total) by mouth 1 hour prior to dental appointment. (Patient not taking: Reported on 11/17/2022) 12 capsule 0 Completed Course   dapagliflozin propanediol (FARXIGA) 10 MG TABS tablet Take 1 tablet (10 mg total) by mouth once daily (Patient not taking: Reported on 11/17/2022) 90 tablet 1 Not Taking   Social History   Socioeconomic History   Marital status: Married    Spouse name: Barista   Number of children: 2   Years of education: Not on file   Highest education level: Not on file  Occupational History   Not on file  Tobacco Use   Smoking status: Former    Packs/day: 1.00    Years: 15.00     Additional pack years: 0.00    Total pack years: 15.00    Types: E-cigarettes, Cigarettes    Quit date: 09/06/1998    Years since quitting: 24.2   Smokeless tobacco: Never   Tobacco comments:    stopped smoking vapes 2 months ago  Vaping Use   Vaping Use: Former  Substance and Sexual Activity   Alcohol use: Yes    Alcohol/week: 10.0 standard drinks of alcohol    Types: 10 Cans of beer per week   Drug use: Never   Sexual activity: Yes    Partners: Female  Other Topics Concern   Not on file  Social History Narrative   Not on file   Social Determinants of Health   Financial  Resource Strain: Not on file  Food Insecurity: No Food Insecurity (11/17/2022)   Hunger Vital Sign    Worried About Running Out of Food in the Last Year: Never true    Ran Out of Food in the Last Year: Never true  Transportation Needs: No Transportation Needs (11/17/2022)   PRAPARE - Hydrologist (Medical): No    Lack of Transportation (Non-Medical): No  Physical Activity: Not on file  Stress: Not on file  Social Connections: Not on file  Intimate Partner Violence: Not At Risk (11/17/2022)   Humiliation, Afraid, Rape, and Kick questionnaire    Fear of Current or Ex-Partner: No    Emotionally Abused: No    Physically Abused: No    Sexually Abused: No    Family History  Problem Relation Age of Onset   Gout Brother       Intake/Output Summary (Last 24 hours) at 11/18/2022 0926 Last data filed at 11/18/2022 0700 Gross per 24 hour  Intake 240 ml  Output 1850 ml  Net -1610 ml    Vitals:   11/17/22 2353 11/18/22 0417 11/18/22 0727 11/18/22 0730  BP: 125/75 132/68 111/68   Pulse: 65 (!) 50 (!) 55   Resp: 20 20 18 15   Temp: 98.2 F (36.8 C) 97.8 F (36.6 C) 98 F (36.7 C)   TempSrc:  Oral    SpO2: 97% 98% 99%   Weight:      Height:        PHYSICAL EXAM General: pleasant middle aged black male , well nourished, in no acute distress. HEENT:  Normocephalic and  atraumatic. Neck:  No JVD.  Lungs: Normal respiratory effort on room air. Clear bilaterally to auscultation. No wheezes, crackles, rhonchi.  Heart: bradycardic but regular . Normal S1 and S2 without gallops or murmurs.  Abdomen: Non-distended appearing.  Msk: Normal strength and tone for age. Extremities: Warm and well perfused. No clubbing, cyanosis. No peripheral edema.  Neuro: Alert and oriented X 3. Psych:  Answers questions appropriately.   Labs: Basic Metabolic Panel: Recent Labs    11/17/22 0323 11/17/22 0511 11/18/22 0459  NA 128*  --  137  K 3.4*  --  4.2  CL 95*  --  103  CO2 21*  --  23  GLUCOSE 64*  --  136*  BUN 15  --  25*  CREATININE 1.12  --  1.23  CALCIUM 8.5*  --  9.1  MG  --  1.8  --    Liver Function Tests: No results for input(s): "AST", "ALT", "ALKPHOS", "BILITOT", "PROT", "ALBUMIN" in the last 72 hours. No results for input(s): "LIPASE", "AMYLASE" in the last 72 hours. CBC: Recent Labs    11/17/22 0323 11/18/22 0459  WBC 9.6 13.2*  HGB 13.3 13.3  HCT 41.8 41.3  MCV 82.9 81.9  PLT 225 240   Cardiac Enzymes: Recent Labs    11/17/22 0511 11/17/22 1225 11/17/22 1336  TROPONINIHS 243* 256* 313*   BNP: Recent Labs    11/17/22 0323  BNP 124.2*   D-Dimer: No results for input(s): "DDIMER" in the last 72 hours. Hemoglobin A1C: No results for input(s): "HGBA1C" in the last 72 hours. Fasting Lipid Panel: No results for input(s): "CHOL", "HDL", "LDLCALC", "TRIG", "CHOLHDL", "LDLDIRECT" in the last 72 hours. Thyroid Function Tests: No results for input(s): "TSH", "T4TOTAL", "T3FREE", "THYROIDAB" in the last 72 hours.  Invalid input(s): "FREET3" Anemia Panel: No results for input(s): "VITAMINB12", "FOLATE", "FERRITIN", "TIBC", "IRON", "  RETICCTPCT" in the last 72 hours.   Radiology: DG Chest 2 View  Result Date: 11/17/2022 CLINICAL DATA:  Shortness of breath, cough EXAM: CHEST - 2 VIEW COMPARISON:  05/03/2020 FINDINGS: Prior CABG and  aortic valve replacement. Heart and mediastinal contours are within normal limits. No focal opacities or effusions. No acute bony abnormality. IMPRESSION: No active cardiopulmonary disease. Electronically Signed   By: Rolm Baptise M.D.   On: 11/17/2022 03:38    TELEMETRY reviewed by me (LT) 11/18/2022 : sinus brady to NSR rate 50s-60s  EKG reviewed by me: NSR PVC LBBB rate 69  Data reviewed by me (LT) 11/18/2022: last cardiology clinic note, ed ntoe, admission H&P, last 24h vitals tele labs imaging I/O    Principal Problem:   Elevated troponin Active Problems:   HTN (hypertension)   Uncontrolled type 2 diabetes mellitus with hypoglycemia, without long-term current use of insulin (HCC)   S/P TAVR (transcatheter aortic valve replacement)   Chronic diastolic CHF (congestive heart failure) (HCC)   Acute bronchitis    ASSESSMENT AND PLAN:  AITHEN BEIN is a 75yoM with a PMH of CAD s/p CABG x 3 (LIMA-OM1, SVG-distal LCX & RCA, 03/03/2020) & previous stents, severe AS s/p SAVR (Edwards pericardial valve 03/03/2020), HFpEF (55%, mod MR 11/29/2021), HTN, HLD, DM2, hx tobacco use, who presented to [facility] ED 11/17/22 with wheezing and shortness of breath. Troponin was checked, which was slightly elevated with peak of 313 for which cardiology was consulted.   # acute bronchitis # hx tobacco use, recent vaping Presented with shortness of breath and wheezing, improved following duonebs and solumedrol.  -strongly encouraged cessation from vaping and tobacco products  # demand ischemia  # CAD s/p CABG without chest pain  # abnormal lexiscan myoview  Known severe CAD with chronic stable angina. Recent lexiscan myoview 09/19/22 with mildly reduced LVEF, no rWMAs, but moderate inferoapical ischemia for which LHC was recommended. At follow up clinic visit, patient preferred to hold off on LHC until a later date and is now agreeable to proceed. He denies current chest pain, troponins mildly elevated and trend 180,  243, 256, 323, 193 without ischemic EKG changes, which represents with demand/supply mismatch in the setting of known underlying severe CAD and not ACS.  - continue aspirin, atorvastatin - will arrange for outpatient follow up with Dr. Saralyn Pilar in 1-2 weeks to discuss further ischemic workup   # chronic HFpEF  # severe AS s/p SAVR  Euvolemic on exam, BNP minimally elevated at 124. Echo this admission without change from prior from 1 yr ago, prosthetic valve well seated and functioning appropriately.  - continue GDMT with coreg and lisinopril. Add low dose spiro 12.5mg  daily. Stop farxiga (cannot afford), had genital irritation with jardiance in the past.   Ok for discharge today from a cardiac perspective. Will arrange for follow up in clinic with Dr. Saralyn Pilar in 1-2 weeks.    This patient's plan of care was discussed and created with Dr. Saralyn Pilar and he is in agreement.  Signed: Tristan Schroeder , PA-C 11/18/2022, 9:26 AM [facility]

## 2022-11-18 NOTE — Discharge Summary (Signed)
Curtis Maxwell [id number] DOB: 1961/06/14 DOA: 11/17/2022  PCP: [facility] date: 11/17/2022 Discharge date: 11/18/2022  Time spent: 40 minutes  Recommendations for Outpatient Follow-up:  Cardiology f/u 1-2 weeks     Discharge Diagnoses:  Principal Problem:   Elevated troponin Active Problems:   CAD S/P CABG x 3   Acute bronchitis   Chronic diastolic CHF (congestive heart failure) (HCC)   HTN (hypertension)   Uncontrolled type 2 diabetes mellitus with hypoglycemia, without long-term current use of insulin (HCC)   S/P TAVR (transcatheter aortic valve replacement)   Discharge Condition: stable  Diet recommendation: heart healthy  Filed Weights   11/17/22 0317 11/17/22 0938  Weight: 95.3 kg 99 kg    History of present illness:  From admission h and p Curtis Maxwell is a 62 y.o. male with medical history significant for DM, HTN, CAD s/p CABG and aortic stenosis s/p TAVR with recent nuclear stress test 09/19/2022 that was consistent with ischemia who presents to the ED with shortness of breath and wheezing.  He denied chest pain, lower extremity pain or swelling, fever or chills.  Patient has had chronic exertional shortness of breath without orthopnea PND or pedal edema and intermittent chest pain in the recent past, reason for which she underwent the stress test. ED course and Data review: Vitals within normal limits.  Troponin 180 and BNP 124.  Sodium 128 and potassium 3.4.  Respiratory viral panel pending.  EKG, personally viewed and interpreted showing NSR with old LBBB.  Chest x-ray clear Patient was treated with DuoNeb and Solu-Medrol and given a chewable aspirin.  Admission requested due to cardiac history with elevated troponin.   Hospital Course:  Patient presents with shortness of breath and wheezing. W/u revealed tropinemia that peaked at 313. Cardiology consulted. Patient has known cad s/p cabg and TAVR. Recent abnormal stress test. Cardiology advises  close outpt f/u 1-2 weeks to discuss further ischemic w/u. Euvolemic on exam, does not appear to be in significant chf exacerbation. Dyspnea much improved, wheezing resolved. CXR clear, covid/flu/rsv negative, d-dimer wnl. Possible bronchitis - advised supportive care.  Procedures: none   Consultations: cardiology  Discharge Exam: Vitals:   11/18/22 1159 11/18/22 1535  BP: 122/64 116/69  Pulse: (!) 54 60  Resp: 18 18  Temp: 98.2 F (36.8 C) 98.1 F (36.7 C)  SpO2: 97% 97%    General: NAD Cardiovascular: soft systolic murmur Respiratory: CTAB Ext: warm, no edema  Discharge Instructions   Discharge Instructions     Diet - low sodium heart healthy   Complete by: As directed    Increase activity slowly   Complete by: As directed       Allergies as of 11/18/2022   No Known Allergies      Medication List     STOP taking these medications    acetaminophen 325 MG tablet Commonly known as: TYLENOL   amoxicillin 500 MG capsule Commonly known as: AMOXIL   Farxiga 10 MG Tabs tablet Generic drug: dapagliflozin propanediol       TAKE these medications    acetaminophen-codeine 300-30 MG tablet Commonly known as: TYLENOL #3 Take 1 tablet by mouth 2 (two) times daily as needed for severe pain or moderate pain.   ALPRAZolam 0.5 MG tablet Commonly known as: XANAX Take 1 tablet (0.5 mg total) by mouth 2 (two) times daily as needed for Sleep   amLODipine 10 MG tablet Commonly known as: NORVASC Take 1 tablet (10 mg total)  by mouth daily.   aspirin EC 325 MG tablet Take 1 tablet (325 mg total) by mouth daily.   atorvastatin 20 MG tablet Commonly known as: LIPITOR Take 1 tablet (20 mg total) by mouth once daily   carvedilol 3.125 MG tablet Commonly known as: COREG Take 1 tablet (3.125 mg total) by mouth 2 (two) times daily with meals   Cholecalciferol 50 MCG (2000 UT) Caps Take 2,000 Units by mouth daily.   colchicine 0.6 MG tablet Take 1 tablet (0.6 mg  total) by mouth 2 (two) times daily as needed   Fish Oil 1200 MG Caps Take 1,200 mg by mouth 2 (two) times a week.   furosemide 40 MG tablet Commonly known as: LASIX Take 1 tablet (40 mg total) by mouth once daily as needed for edema   glipiZIDE 5 MG 24 hr tablet Commonly known as: GLUCOTROL XL Take 5 mg by mouth daily with breakfast.   lisinopril 40 MG tablet Commonly known as: ZESTRIL Take 1 tablet (40 mg total) by mouth daily.   metFORMIN 500 MG tablet Commonly known as: GLUCOPHAGE Take 1 tablet (500 mg total) by mouth 2 (two) times daily with meals   nitroGLYCERIN 0.4 MG SL tablet Commonly known as: NITROSTAT Place 1 tablet (0.4 mg total) under the tongue every 5 (five) minutes as needed for Chest pain. May take up to 3 doses.   omeprazole 20 MG capsule Commonly known as: PRILOSEC Take 1 capsule (20 mg total) by mouth 2 (two) times daily before meals   sildenafil 20 MG tablet Commonly known as: REVATIO Take 100 mg by mouth daily as needed (erectile dysfuntion.).       No Known Allergies  Follow-up Information     Paraschos, Alexander, MD. Go in 1 week(s).   Specialty: Cardiology Contact information: [facility] [place] [address] [phone]                  The results of significant diagnostics from this hospitalization (including imaging, microbiology, ancillary and laboratory) are listed below for reference.    Significant Diagnostic Studies: ECHOCARDIOGRAM COMPLETE  Result Date: 11/18/2022    ECHOCARDIOGRAM REPORT   Patient Name:   Curtis Maxwell Date of Exam: 11/18/2022 Medical Rec #:  [id number]      Height:       66.0 in Accession #:    [id number]     Weight:       218.2 lb Date of Birth:  15-Sep-1960      BSA:          2.075 m Patient Age:    73 years       BP:           132/68 mmHg Patient Gender: M              HR:           50 bpm. Exam Location:  [facility] Procedure: 2D Echo, Cardiac Doppler and Color  Doppler Indications:     CHF-acute diastolic [id number]  History:         Patient has prior history of Echocardiogram examinations, most                  recent 03/03/2020. S/P TAVR.  Sonographer:     Sherrie Sport Referring Phys:  [id number] Athena Masse Diagnosing Phys: Isaias Cowman MD IMPRESSIONS  1. Left ventricular ejection fraction, by estimation, is 50 to 55%. The left ventricle has  low normal function. The left ventricle has no regional wall motion abnormalities. Left ventricular diastolic parameters were normal.  2. Right ventricular systolic function is normal. The right ventricular size is normal.  3. The mitral valve is normal in structure. Moderate mitral valve regurgitation. No evidence of mitral stenosis.  4. The aortic valve is normal in structure. Aortic valve regurgitation is not visualized. No aortic stenosis is present.  5. The inferior vena cava is normal in size with greater than 50% respiratory variability, suggesting right atrial pressure of 3 mmHg. FINDINGS  Left Ventricle: Left ventricular ejection fraction, by estimation, is 50 to 55%. The left ventricle has low normal function. The left ventricle has no regional wall motion abnormalities. The left ventricular internal cavity size was normal in size. There is no left ventricular hypertrophy. Left ventricular diastolic parameters were normal. Right Ventricle: The right ventricular size is normal. No increase in right ventricular wall thickness. Right ventricular systolic function is normal. Left Atrium: Left atrial size was normal in size. Right Atrium: Right atrial size was normal in size. Pericardium: There is no evidence of pericardial effusion. Mitral Valve: The mitral valve is normal in structure. Moderate mitral valve regurgitation. No evidence of mitral valve stenosis. Tricuspid Valve: The tricuspid valve is normal in structure. Tricuspid valve regurgitation is not demonstrated. No evidence of tricuspid stenosis. Aortic Valve: The  aortic valve is normal in structure. Aortic valve regurgitation is not visualized. No aortic stenosis is present. Aortic valve mean gradient measures 9.0 mmHg. Aortic valve peak gradient measures 15.6 mmHg. Aortic valve area, by VTI measures 1.76 cm. There is a bioprosthetic valve present in the aortic position. Pulmonic Valve: The pulmonic valve was normal in structure. Pulmonic valve regurgitation is not visualized. No evidence of pulmonic stenosis. Aorta: The aortic root is normal in size and structure. Venous: The inferior vena cava is normal in size with greater than 50% respiratory variability, suggesting right atrial pressure of 3 mmHg. IAS/Shunts: No atrial level shunt detected by color flow Doppler.  LEFT VENTRICLE PLAX 2D LVIDd:         5.20 cm   Diastology LVIDs:         3.80 cm   LV e' medial:    6.85 cm/s LV PW:         1.40 cm   LV E/e' medial:  19.7 LV IVS:        1.20 cm   LV e' lateral:   9.03 cm/s LVOT diam:     2.00 cm   LV E/e' lateral: 15.0 LV SV:         63 LV SV Index:   30 LVOT Area:     3.14 cm  RIGHT VENTRICLE RV Basal diam:  4.80 cm RV Mid diam:    3.50 cm RV S prime:     9.57 cm/s LEFT ATRIUM             Index        RIGHT ATRIUM          Index LA diam:        4.20 cm 2.02 cm/m   RA Area:     9.74 cm LA Vol (A2C):   78.8 ml 37.97 ml/m  RA Volume:   17.80 ml 8.58 ml/m LA Vol (A4C):   55.4 ml 26.70 ml/m LA Biplane Vol: 69.6 ml 33.54 ml/m  AORTIC VALVE AV Area (Vmax):    1.46 cm AV Area (Vmean):   1.46 cm AV  Area (VTI):     1.76 cm AV Vmax:           197.33 cm/s AV Vmean:          138.667 cm/s AV VTI:            0.358 m AV Peak Grad:      15.6 mmHg AV Mean Grad:      9.0 mmHg LVOT Vmax:         91.80 cm/s LVOT Vmean:        64.500 cm/s LVOT VTI:          0.201 m LVOT/AV VTI ratio: 0.56  AORTA Ao Root diam: 2.90 cm MITRAL VALVE                TRICUSPID VALVE MV Area (PHT): 3.65 cm     TR Peak grad:   28.7 mmHg MV Decel Time: 208 msec     TR Vmax:        268.00 cm/s MV E velocity:  135.00 cm/s MV A velocity: 70.70 cm/s   SHUNTS MV E/A ratio:  1.91         Systemic VTI:  0.20 m                             Systemic Diam: 2.00 cm Isaias Cowman MD Electronically signed by Isaias Cowman MD Signature Date/Time: 11/18/2022/1:10:14 PM    Final    DG Chest 2 View  Result Date: 11/17/2022 CLINICAL DATA:  Shortness of breath, cough EXAM: CHEST - 2 VIEW COMPARISON:  05/03/2020 FINDINGS: Prior CABG and aortic valve replacement. Heart and mediastinal contours are within normal limits. No focal opacities or effusions. No acute bony abnormality. IMPRESSION: No active cardiopulmonary disease. Electronically Signed   By: Rolm Baptise M.D.   On: 11/17/2022 03:38    Microbiology: Recent Results (from the past 240 hour(s))  Resp panel by RT-PCR (RSV, Flu A&B, Covid) Anterior Nasal Swab     Status: None   Collection Time: 11/17/22  4:47 AM   Specimen: Anterior Nasal Swab  Result Value Ref Range Status   SARS Coronavirus 2 by RT PCR NEGATIVE NEGATIVE Final    Comment: (NOTE) SARS-CoV-2 target nucleic acids are NOT DETECTED.  The SARS-CoV-2 RNA is generally detectable in upper respiratory specimens during the acute phase of infection. The lowest concentration of SARS-CoV-2 viral copies this assay can detect is 138 copies/mL. A negative result does not preclude SARS-Cov-2 infection and should not be used as the sole basis for treatment or other patient management decisions. A negative result may occur with  improper specimen collection/handling, submission of specimen other than nasopharyngeal swab, presence of viral mutation(s) within the areas targeted by this assay, and inadequate number of viral copies(<138 copies/mL). A negative result must be combined with clinical observations, patient history, and epidemiological information. The expected result is Negative.  Fact Sheet for Patients:  EntrepreneurPulse.com.au  Fact Sheet for Healthcare Providers:   IncredibleEmployment.be  This test is no t yet approved or cleared by the [place] FDA and  has been authorized for detection and/or diagnosis of SARS-CoV-2 by FDA under an Emergency Use Authorization (EUA). This EUA will remain  in effect (meaning this test can be used) for the duration of the COVID-19 declaration under Section 564(b)(1) of the Act, 21 U.S.C.section 360bbb-3(b)(1), unless the authorization is terminated  or revoked sooner.       Influenza A by  PCR NEGATIVE NEGATIVE Final   Influenza B by PCR NEGATIVE NEGATIVE Final    Comment: (NOTE) The Xpert Xpress SARS-CoV-2/FLU/RSV plus assay is intended as an aid in the diagnosis of influenza from Nasopharyngeal swab specimens and should not be used as a sole basis for treatment. Nasal washings and aspirates are unacceptable for Xpert Xpress SARS-CoV-2/FLU/RSV testing.  Fact Sheet for Patients: EntrepreneurPulse.com.au  Fact Sheet for Healthcare Providers: IncredibleEmployment.be  This test is not yet approved or cleared by the [place] FDA and has been authorized for detection and/or diagnosis of SARS-CoV-2 by FDA under an Emergency Use Authorization (EUA). This EUA will remain in effect (meaning this test can be used) for the duration of the COVID-19 declaration under Section 564(b)(1) of the Act, 21 U.S.C. section 360bbb-3(b)(1), unless the authorization is terminated or revoked.     Resp Syncytial Virus by PCR NEGATIVE NEGATIVE Final    Comment: (NOTE) Fact Sheet for Patients: EntrepreneurPulse.com.au  Fact Sheet for Healthcare Providers: IncredibleEmployment.be  This test is not yet approved or cleared by the [place] FDA and has been authorized for detection and/or diagnosis of SARS-CoV-2 by FDA under an Emergency Use Authorization (EUA). This EUA will remain in effect (meaning this test can be used) for  the duration of the COVID-19 declaration under Section 564(b)(1) of the Act, 21 U.S.C. section 360bbb-3(b)(1), unless the authorization is terminated or revoked.  Performed at [facility], [address] [place], [place] [address]      Labs: Basic Metabolic Panel: Recent Labs  Lab 11/17/22 0323 11/17/22 0511 11/18/22 0459  NA 128*  --  137  K 3.4*  --  4.2  CL 95*  --  103  CO2 21*  --  23  GLUCOSE 64*  --  136*  BUN 15  --  25*  CREATININE 1.12  --  1.23  CALCIUM 8.5*  --  9.1  MG  --  1.8  --    Liver Function Tests: No results for input(s): "AST", "ALT", "ALKPHOS", "BILITOT", "PROT", "ALBUMIN" in the last 168 hours. No results for input(s): "LIPASE", "AMYLASE" in the last 168 hours. No results for input(s): "AMMONIA" in the last 168 hours. CBC: Recent Labs  Lab 11/17/22 0323 11/18/22 0459  WBC 9.6 13.2*  HGB 13.3 13.3  HCT 41.8 41.3  MCV 82.9 81.9  PLT 225 240   Cardiac Enzymes: No results for input(s): "CKTOTAL", "CKMB", "CKMBINDEX", "TROPONINI" in the last 168 hours. BNP: BNP (last 3 results) Recent Labs    11/17/22 0323  BNP 124.2*    ProBNP (last 3 results) No results for input(s): "PROBNP" in the last 8760 hours.  CBG: Recent Labs  Lab 11/17/22 1503 11/17/22 2057 11/18/22 0730 11/18/22 1201 11/18/22 1537  GLUCAP 214* 193* 150* 132* 175*       Signed:  Desma Maxim MD.  [facility] 11/18/2022, 4:25 PM

## 2022-11-19 ENCOUNTER — Other Ambulatory Visit: Payer: Self-pay | Admitting: Obstetrics and Gynecology

## 2022-11-19 MED ORDER — SPIRONOLACTONE 25 MG PO TABS: 12.5000 mg | ORAL_TABLET | Freq: Every day | ORAL | 1 refills | Status: AC

## 2022-11-25 DIAGNOSIS — E119 Type 2 diabetes mellitus without complications: Secondary | ICD-10-CM | POA: Diagnosis not present

## 2022-11-25 DIAGNOSIS — E7801 Familial hypercholesterolemia: Secondary | ICD-10-CM | POA: Diagnosis not present

## 2022-11-25 DIAGNOSIS — I10 Essential (primary) hypertension: Secondary | ICD-10-CM | POA: Diagnosis not present

## 2022-12-16 ENCOUNTER — Other Ambulatory Visit: Payer: Self-pay

## 2022-12-19 DIAGNOSIS — Z9889 Other specified postprocedural states: Secondary | ICD-10-CM | POA: Diagnosis not present

## 2022-12-19 DIAGNOSIS — E7800 Pure hypercholesterolemia, unspecified: Secondary | ICD-10-CM | POA: Diagnosis not present

## 2022-12-19 DIAGNOSIS — E119 Type 2 diabetes mellitus without complications: Secondary | ICD-10-CM | POA: Diagnosis not present

## 2022-12-19 DIAGNOSIS — Z1329 Encounter for screening for other suspected endocrine disorder: Secondary | ICD-10-CM | POA: Diagnosis not present

## 2022-12-19 DIAGNOSIS — Z125 Encounter for screening for malignant neoplasm of prostate: Secondary | ICD-10-CM | POA: Diagnosis not present

## 2022-12-19 DIAGNOSIS — Z952 Presence of prosthetic heart valve: Secondary | ICD-10-CM | POA: Diagnosis not present

## 2022-12-19 DIAGNOSIS — I110 Hypertensive heart disease with heart failure: Secondary | ICD-10-CM | POA: Diagnosis not present

## 2022-12-19 DIAGNOSIS — E559 Vitamin D deficiency, unspecified: Secondary | ICD-10-CM | POA: Diagnosis not present

## 2022-12-19 DIAGNOSIS — I5032 Chronic diastolic (congestive) heart failure: Secondary | ICD-10-CM | POA: Diagnosis not present

## 2022-12-19 DIAGNOSIS — Z951 Presence of aortocoronary bypass graft: Secondary | ICD-10-CM | POA: Diagnosis not present

## 2022-12-19 DIAGNOSIS — Z0000 Encounter for general adult medical examination without abnormal findings: Secondary | ICD-10-CM | POA: Diagnosis not present

## 2022-12-19 DIAGNOSIS — I25119 Atherosclerotic heart disease of native coronary artery with unspecified angina pectoris: Secondary | ICD-10-CM | POA: Diagnosis not present

## 2022-12-19 DIAGNOSIS — Z1331 Encounter for screening for depression: Secondary | ICD-10-CM | POA: Diagnosis not present

## 2022-12-19 DIAGNOSIS — R7989 Other specified abnormal findings of blood chemistry: Secondary | ICD-10-CM | POA: Diagnosis not present

## 2023-03-10 DIAGNOSIS — Z9889 Other specified postprocedural states: Secondary | ICD-10-CM | POA: Diagnosis not present

## 2023-03-10 DIAGNOSIS — I5032 Chronic diastolic (congestive) heart failure: Secondary | ICD-10-CM | POA: Diagnosis not present

## 2023-03-10 DIAGNOSIS — Z951 Presence of aortocoronary bypass graft: Secondary | ICD-10-CM | POA: Diagnosis not present

## 2023-03-10 DIAGNOSIS — Z952 Presence of prosthetic heart valve: Secondary | ICD-10-CM | POA: Diagnosis not present

## 2023-03-10 DIAGNOSIS — I25119 Atherosclerotic heart disease of native coronary artery with unspecified angina pectoris: Secondary | ICD-10-CM | POA: Diagnosis not present

## 2023-03-10 DIAGNOSIS — E11649 Type 2 diabetes mellitus with hypoglycemia without coma: Secondary | ICD-10-CM | POA: Diagnosis not present

## 2023-03-10 DIAGNOSIS — I10 Essential (primary) hypertension: Secondary | ICD-10-CM | POA: Diagnosis not present

## 2023-03-10 DIAGNOSIS — E7800 Pure hypercholesterolemia, unspecified: Secondary | ICD-10-CM | POA: Diagnosis not present

## 2023-04-11 DIAGNOSIS — L811 Chloasma: Secondary | ICD-10-CM | POA: Diagnosis not present

## 2023-06-20 DIAGNOSIS — E559 Vitamin D deficiency, unspecified: Secondary | ICD-10-CM | POA: Diagnosis not present

## 2023-06-20 DIAGNOSIS — Z09 Encounter for follow-up examination after completed treatment for conditions other than malignant neoplasm: Secondary | ICD-10-CM | POA: Diagnosis not present

## 2023-06-20 DIAGNOSIS — I110 Hypertensive heart disease with heart failure: Secondary | ICD-10-CM | POA: Diagnosis not present

## 2023-06-20 DIAGNOSIS — E11649 Type 2 diabetes mellitus with hypoglycemia without coma: Secondary | ICD-10-CM | POA: Diagnosis not present

## 2023-06-20 DIAGNOSIS — K219 Gastro-esophageal reflux disease without esophagitis: Secondary | ICD-10-CM | POA: Diagnosis not present

## 2023-06-20 DIAGNOSIS — Z23 Encounter for immunization: Secondary | ICD-10-CM | POA: Diagnosis not present

## 2023-06-20 DIAGNOSIS — E7800 Pure hypercholesterolemia, unspecified: Secondary | ICD-10-CM | POA: Diagnosis not present

## 2023-06-20 DIAGNOSIS — I10 Essential (primary) hypertension: Secondary | ICD-10-CM | POA: Diagnosis not present

## 2023-06-20 DIAGNOSIS — E119 Type 2 diabetes mellitus without complications: Secondary | ICD-10-CM | POA: Diagnosis not present

## 2023-06-20 DIAGNOSIS — I5032 Chronic diastolic (congestive) heart failure: Secondary | ICD-10-CM | POA: Diagnosis not present

## 2023-06-20 DIAGNOSIS — R7989 Other specified abnormal findings of blood chemistry: Secondary | ICD-10-CM | POA: Diagnosis not present

## 2023-07-03 DIAGNOSIS — Z87438 Personal history of other diseases of male genital organs: Secondary | ICD-10-CM | POA: Diagnosis not present

## 2023-07-03 DIAGNOSIS — Z8639 Personal history of other endocrine, nutritional and metabolic disease: Secondary | ICD-10-CM | POA: Diagnosis not present

## 2023-07-03 DIAGNOSIS — R300 Dysuria: Secondary | ICD-10-CM | POA: Diagnosis not present

## 2023-07-11 DIAGNOSIS — I25119 Atherosclerotic heart disease of native coronary artery with unspecified angina pectoris: Secondary | ICD-10-CM | POA: Diagnosis not present

## 2023-07-11 DIAGNOSIS — Z951 Presence of aortocoronary bypass graft: Secondary | ICD-10-CM | POA: Diagnosis not present

## 2023-07-11 DIAGNOSIS — I5032 Chronic diastolic (congestive) heart failure: Secondary | ICD-10-CM | POA: Diagnosis not present

## 2023-07-11 DIAGNOSIS — I10 Essential (primary) hypertension: Secondary | ICD-10-CM | POA: Diagnosis not present

## 2023-07-11 DIAGNOSIS — Z952 Presence of prosthetic heart valve: Secondary | ICD-10-CM | POA: Diagnosis not present

## 2023-07-11 DIAGNOSIS — Z9889 Other specified postprocedural states: Secondary | ICD-10-CM | POA: Diagnosis not present

## 2023-07-11 DIAGNOSIS — E7800 Pure hypercholesterolemia, unspecified: Secondary | ICD-10-CM | POA: Diagnosis not present

## 2023-11-04 DIAGNOSIS — L811 Chloasma: Secondary | ICD-10-CM | POA: Diagnosis not present

## 2023-11-25 DIAGNOSIS — M7711 Lateral epicondylitis, right elbow: Secondary | ICD-10-CM | POA: Diagnosis not present

## 2023-11-27 DIAGNOSIS — I25708 Atherosclerosis of coronary artery bypass graft(s), unspecified, with other forms of angina pectoris: Secondary | ICD-10-CM | POA: Diagnosis not present

## 2023-11-27 DIAGNOSIS — Z952 Presence of prosthetic heart valve: Secondary | ICD-10-CM | POA: Diagnosis not present

## 2023-11-27 DIAGNOSIS — R079 Chest pain, unspecified: Secondary | ICD-10-CM | POA: Diagnosis not present

## 2023-11-27 DIAGNOSIS — I10 Essential (primary) hypertension: Secondary | ICD-10-CM | POA: Diagnosis not present

## 2023-11-27 DIAGNOSIS — E7800 Pure hypercholesterolemia, unspecified: Secondary | ICD-10-CM | POA: Diagnosis not present

## 2023-11-27 DIAGNOSIS — Z951 Presence of aortocoronary bypass graft: Secondary | ICD-10-CM | POA: Diagnosis not present

## 2023-11-27 DIAGNOSIS — E11649 Type 2 diabetes mellitus with hypoglycemia without coma: Secondary | ICD-10-CM | POA: Diagnosis not present

## 2023-11-27 DIAGNOSIS — I4891 Unspecified atrial fibrillation: Secondary | ICD-10-CM | POA: Diagnosis not present

## 2023-11-27 DIAGNOSIS — Z01810 Encounter for preprocedural cardiovascular examination: Secondary | ICD-10-CM | POA: Diagnosis not present

## 2023-11-27 DIAGNOSIS — I5032 Chronic diastolic (congestive) heart failure: Secondary | ICD-10-CM | POA: Diagnosis not present

## 2023-12-01 DIAGNOSIS — D72829 Elevated white blood cell count, unspecified: Secondary | ICD-10-CM | POA: Diagnosis not present

## 2023-12-01 DIAGNOSIS — M7711 Lateral epicondylitis, right elbow: Secondary | ICD-10-CM | POA: Insufficient documentation

## 2023-12-02 ENCOUNTER — Encounter: Payer: Self-pay | Admitting: Internal Medicine

## 2023-12-02 ENCOUNTER — Ambulatory Visit
Admission: RE | Admit: 2023-12-02 | Discharge: 2023-12-02 | Disposition: A | Discharge status: Home / Self Care | Attending: Internal Medicine | Admitting: Internal Medicine

## 2023-12-02 ENCOUNTER — Other Ambulatory Visit: Payer: Self-pay

## 2023-12-02 ENCOUNTER — Encounter
Admission: RE | Disposition: A | Discharge status: Home / Self Care | Payer: Self-pay | Source: Home / Self Care | Attending: Internal Medicine

## 2023-12-02 DIAGNOSIS — I2582 Chronic total occlusion of coronary artery: Secondary | ICD-10-CM | POA: Insufficient documentation

## 2023-12-02 DIAGNOSIS — Z955 Presence of coronary angioplasty implant and graft: Secondary | ICD-10-CM | POA: Diagnosis not present

## 2023-12-02 DIAGNOSIS — I2584 Coronary atherosclerosis due to calcified coronary lesion: Secondary | ICD-10-CM | POA: Diagnosis not present

## 2023-12-02 DIAGNOSIS — R079 Chest pain, unspecified: Secondary | ICD-10-CM

## 2023-12-02 DIAGNOSIS — I25119 Atherosclerotic heart disease of native coronary artery with unspecified angina pectoris: Secondary | ICD-10-CM | POA: Insufficient documentation

## 2023-12-02 DIAGNOSIS — Z953 Presence of xenogenic heart valve: Secondary | ICD-10-CM | POA: Insufficient documentation

## 2023-12-02 LAB — GLUCOSE, CAPILLARY
Glucose-Capillary: 99 mg/dL (ref 70–99)
Glucose-Capillary: 96 mg/dL (ref 70–99)

## 2023-12-02 SURGERY — LEFT HEART CATH AND CORS/GRAFTS ANGIOGRAPHY
Anesthesia: Moderate Sedation

## 2023-12-02 MED ORDER — MIDAZOLAM HCL 2 MG/2ML IJ SOLN
INTRAMUSCULAR | Status: DC | PRN
  Administered 2023-12-02: 1 mg via INTRAVENOUS

## 2023-12-02 MED ORDER — SODIUM CHLORIDE 0.9 % IV SOLN: INTRAVENOUS | Status: DC

## 2023-12-02 MED ORDER — FENTANYL CITRATE (PF) 100 MCG/2ML IJ SOLN
INTRAMUSCULAR | Status: DC | PRN
  Administered 2023-12-02: 50 ug via INTRAVENOUS

## 2023-12-02 MED ORDER — SODIUM CHLORIDE 0.9 % WEIGHT BASED INFUSION: 1.0000 mL/kg/h | INTRAVENOUS | Status: DC

## 2023-12-02 MED ORDER — MIDAZOLAM HCL 2 MG/2ML IJ SOLN
INTRAMUSCULAR | Status: AC
  Filled 2023-12-02: qty 2

## 2023-12-02 MED ORDER — HEPARIN (PORCINE) IN NACL 1000-0.9 UT/500ML-% IV SOLN
INTRAVENOUS | Status: DC | PRN
  Administered 2023-12-02: 1000 mL

## 2023-12-02 MED ORDER — HEPARIN (PORCINE) IN NACL 1000-0.9 UT/500ML-% IV SOLN
INTRAVENOUS | Status: AC
  Filled 2023-12-02: qty 1000

## 2023-12-02 MED ORDER — IOHEXOL 300 MG/ML  SOLN
INTRAMUSCULAR | Status: DC | PRN
  Administered 2023-12-02: 148 mL

## 2023-12-02 MED ORDER — SODIUM CHLORIDE 0.9% FLUSH: 3.0000 mL | INTRAVENOUS | Status: DC | PRN

## 2023-12-02 MED ORDER — ASPIRIN 81 MG PO CHEW: 81.0000 mg | CHEWABLE_TABLET | ORAL | Status: DC

## 2023-12-02 MED ORDER — SODIUM CHLORIDE 0.9% FLUSH: 3.0000 mL | Freq: Two times a day (BID) | INTRAVENOUS | Status: DC

## 2023-12-02 MED ORDER — FENTANYL CITRATE (PF) 100 MCG/2ML IJ SOLN
INTRAMUSCULAR | Status: AC
  Filled 2023-12-02: qty 2

## 2023-12-02 MED ORDER — SODIUM CHLORIDE 0.9 % IV SOLN: 250.0000 mL | INTRAVENOUS | Status: DC | PRN

## 2023-12-02 MED ORDER — LIDOCAINE HCL (PF) 1 % IJ SOLN
INTRAMUSCULAR | Status: DC | PRN
  Administered 2023-12-02: 10 mL

## 2023-12-02 SURGICAL SUPPLY — 13 items
CATH INFINITI 5 FR LCB (CATHETERS) IMPLANT
CATH INFINITI 5 FR MPA2 (CATHETERS) IMPLANT
CATH INFINITI 5FR MULTPACK ANG (CATHETERS) IMPLANT
DEVICE CLOSURE MYNXGRIP 5F (Vascular Products) IMPLANT
DRAPE BRACHIAL (DRAPES) IMPLANT
NDL PERC 18GX7CM (NEEDLE) IMPLANT
NEEDLE PERC 18GX7CM (NEEDLE) ×1 IMPLANT
PACK CARDIAC CATH (CUSTOM PROCEDURE TRAY) ×1 IMPLANT
PROTECTION STATION PRESSURIZED (MISCELLANEOUS) ×1 IMPLANT
SET ATX-X65L (MISCELLANEOUS) IMPLANT
SHEATH AVANTI 5FR X 11CM (SHEATH) IMPLANT
STATION PROTECTION PRESSURIZED (MISCELLANEOUS) IMPLANT
WIRE GUIDERIGHT .035X150 (WIRE) IMPLANT

## 2023-12-02 NOTE — Discharge Instructions (Signed)

## 2023-12-03 LAB — CARDIAC CATHETERIZATION: Cath EF Quantitative: 50 %

## 2023-12-04 ENCOUNTER — Encounter: Payer: Self-pay | Admitting: Internal Medicine

## 2023-12-09 DIAGNOSIS — I480 Paroxysmal atrial fibrillation: Secondary | ICD-10-CM | POA: Insufficient documentation

## 2023-12-10 DIAGNOSIS — E11649 Type 2 diabetes mellitus with hypoglycemia without coma: Secondary | ICD-10-CM | POA: Diagnosis not present

## 2023-12-10 DIAGNOSIS — Z9889 Other specified postprocedural states: Secondary | ICD-10-CM | POA: Diagnosis not present

## 2023-12-10 DIAGNOSIS — I5032 Chronic diastolic (congestive) heart failure: Secondary | ICD-10-CM | POA: Diagnosis not present

## 2023-12-10 DIAGNOSIS — I10 Essential (primary) hypertension: Secondary | ICD-10-CM | POA: Diagnosis not present

## 2023-12-10 DIAGNOSIS — I25708 Atherosclerosis of coronary artery bypass graft(s), unspecified, with other forms of angina pectoris: Secondary | ICD-10-CM | POA: Diagnosis not present

## 2023-12-10 DIAGNOSIS — Z951 Presence of aortocoronary bypass graft: Secondary | ICD-10-CM | POA: Diagnosis not present

## 2023-12-10 DIAGNOSIS — E7800 Pure hypercholesterolemia, unspecified: Secondary | ICD-10-CM | POA: Diagnosis not present

## 2023-12-10 DIAGNOSIS — Z952 Presence of prosthetic heart valve: Secondary | ICD-10-CM | POA: Diagnosis not present

## 2023-12-10 DIAGNOSIS — I4891 Unspecified atrial fibrillation: Secondary | ICD-10-CM | POA: Diagnosis not present

## 2023-12-12 DIAGNOSIS — M7711 Lateral epicondylitis, right elbow: Secondary | ICD-10-CM | POA: Diagnosis not present

## 2023-12-12 DIAGNOSIS — M109 Gout, unspecified: Secondary | ICD-10-CM | POA: Diagnosis not present

## 2023-12-17 DIAGNOSIS — I25119 Atherosclerotic heart disease of native coronary artery with unspecified angina pectoris: Secondary | ICD-10-CM | POA: Diagnosis not present

## 2023-12-17 DIAGNOSIS — Z9889 Other specified postprocedural states: Secondary | ICD-10-CM | POA: Diagnosis not present

## 2023-12-17 DIAGNOSIS — I10 Essential (primary) hypertension: Secondary | ICD-10-CM | POA: Diagnosis not present

## 2023-12-17 DIAGNOSIS — I480 Paroxysmal atrial fibrillation: Secondary | ICD-10-CM | POA: Diagnosis not present

## 2023-12-17 DIAGNOSIS — E7800 Pure hypercholesterolemia, unspecified: Secondary | ICD-10-CM | POA: Diagnosis not present

## 2023-12-17 DIAGNOSIS — Z952 Presence of prosthetic heart valve: Secondary | ICD-10-CM | POA: Diagnosis not present

## 2023-12-17 DIAGNOSIS — I5032 Chronic diastolic (congestive) heart failure: Secondary | ICD-10-CM | POA: Diagnosis not present

## 2023-12-17 DIAGNOSIS — Z951 Presence of aortocoronary bypass graft: Secondary | ICD-10-CM | POA: Diagnosis not present

## 2023-12-22 DIAGNOSIS — E559 Vitamin D deficiency, unspecified: Secondary | ICD-10-CM | POA: Diagnosis not present

## 2023-12-22 DIAGNOSIS — I5032 Chronic diastolic (congestive) heart failure: Secondary | ICD-10-CM | POA: Diagnosis not present

## 2023-12-22 DIAGNOSIS — Z1331 Encounter for screening for depression: Secondary | ICD-10-CM | POA: Diagnosis not present

## 2023-12-22 DIAGNOSIS — Z951 Presence of aortocoronary bypass graft: Secondary | ICD-10-CM | POA: Diagnosis not present

## 2023-12-22 DIAGNOSIS — E7800 Pure hypercholesterolemia, unspecified: Secondary | ICD-10-CM | POA: Diagnosis not present

## 2023-12-22 DIAGNOSIS — Z0000 Encounter for general adult medical examination without abnormal findings: Secondary | ICD-10-CM | POA: Diagnosis not present

## 2023-12-22 DIAGNOSIS — E119 Type 2 diabetes mellitus without complications: Secondary | ICD-10-CM | POA: Diagnosis not present

## 2023-12-22 DIAGNOSIS — I25119 Atherosclerotic heart disease of native coronary artery with unspecified angina pectoris: Secondary | ICD-10-CM | POA: Diagnosis not present

## 2023-12-22 DIAGNOSIS — I10 Essential (primary) hypertension: Secondary | ICD-10-CM | POA: Diagnosis not present

## 2023-12-22 DIAGNOSIS — R7989 Other specified abnormal findings of blood chemistry: Secondary | ICD-10-CM | POA: Diagnosis not present

## 2023-12-22 DIAGNOSIS — E11649 Type 2 diabetes mellitus with hypoglycemia without coma: Secondary | ICD-10-CM | POA: Diagnosis not present

## 2023-12-22 DIAGNOSIS — I480 Paroxysmal atrial fibrillation: Secondary | ICD-10-CM | POA: Diagnosis not present

## 2023-12-22 DIAGNOSIS — Z125 Encounter for screening for malignant neoplasm of prostate: Secondary | ICD-10-CM | POA: Diagnosis not present

## 2023-12-22 DIAGNOSIS — Z952 Presence of prosthetic heart valve: Secondary | ICD-10-CM | POA: Diagnosis not present

## 2023-12-22 DIAGNOSIS — Z9889 Other specified postprocedural states: Secondary | ICD-10-CM | POA: Diagnosis not present

## 2023-12-22 DIAGNOSIS — I110 Hypertensive heart disease with heart failure: Secondary | ICD-10-CM | POA: Diagnosis not present

## 2024-01-02 DIAGNOSIS — I480 Paroxysmal atrial fibrillation: Secondary | ICD-10-CM | POA: Diagnosis not present

## 2024-01-02 DIAGNOSIS — I350 Nonrheumatic aortic (valve) stenosis: Secondary | ICD-10-CM | POA: Diagnosis not present

## 2024-01-02 DIAGNOSIS — I447 Left bundle-branch block, unspecified: Secondary | ICD-10-CM | POA: Diagnosis not present

## 2024-01-02 DIAGNOSIS — Z7902 Long term (current) use of antithrombotics/antiplatelets: Secondary | ICD-10-CM | POA: Diagnosis not present

## 2024-01-02 DIAGNOSIS — I5032 Chronic diastolic (congestive) heart failure: Secondary | ICD-10-CM | POA: Diagnosis not present

## 2024-01-02 DIAGNOSIS — Z955 Presence of coronary angioplasty implant and graft: Secondary | ICD-10-CM | POA: Diagnosis not present

## 2024-01-02 DIAGNOSIS — I25110 Atherosclerotic heart disease of native coronary artery with unstable angina pectoris: Secondary | ICD-10-CM | POA: Diagnosis not present

## 2024-01-02 DIAGNOSIS — E785 Hyperlipidemia, unspecified: Secondary | ICD-10-CM | POA: Diagnosis not present

## 2024-01-02 DIAGNOSIS — Z951 Presence of aortocoronary bypass graft: Secondary | ICD-10-CM | POA: Diagnosis not present

## 2024-01-02 DIAGNOSIS — I110 Hypertensive heart disease with heart failure: Secondary | ICD-10-CM | POA: Diagnosis not present

## 2024-01-02 DIAGNOSIS — Z952 Presence of prosthetic heart valve: Secondary | ICD-10-CM | POA: Diagnosis not present

## 2024-01-02 DIAGNOSIS — I4891 Unspecified atrial fibrillation: Secondary | ICD-10-CM | POA: Diagnosis not present

## 2024-01-02 DIAGNOSIS — I25119 Atherosclerotic heart disease of native coronary artery with unspecified angina pectoris: Secondary | ICD-10-CM | POA: Diagnosis not present

## 2024-01-02 DIAGNOSIS — I10 Essential (primary) hypertension: Secondary | ICD-10-CM | POA: Diagnosis not present

## 2024-01-16 DIAGNOSIS — Z951 Presence of aortocoronary bypass graft: Secondary | ICD-10-CM | POA: Diagnosis not present

## 2024-01-16 DIAGNOSIS — R300 Dysuria: Secondary | ICD-10-CM | POA: Diagnosis not present

## 2024-01-19 DIAGNOSIS — Z9889 Other specified postprocedural states: Secondary | ICD-10-CM | POA: Diagnosis not present

## 2024-01-19 DIAGNOSIS — R079 Chest pain, unspecified: Secondary | ICD-10-CM | POA: Diagnosis not present

## 2024-01-19 DIAGNOSIS — M25532 Pain in left wrist: Secondary | ICD-10-CM | POA: Diagnosis not present

## 2024-01-19 DIAGNOSIS — E7800 Pure hypercholesterolemia, unspecified: Secondary | ICD-10-CM | POA: Diagnosis not present

## 2024-01-19 DIAGNOSIS — I10 Essential (primary) hypertension: Secondary | ICD-10-CM | POA: Diagnosis not present

## 2024-01-19 DIAGNOSIS — Z952 Presence of prosthetic heart valve: Secondary | ICD-10-CM | POA: Diagnosis not present

## 2024-01-19 DIAGNOSIS — Z955 Presence of coronary angioplasty implant and graft: Secondary | ICD-10-CM | POA: Diagnosis not present

## 2024-01-19 DIAGNOSIS — Z951 Presence of aortocoronary bypass graft: Secondary | ICD-10-CM | POA: Diagnosis not present

## 2024-01-19 DIAGNOSIS — I25119 Atherosclerotic heart disease of native coronary artery with unspecified angina pectoris: Secondary | ICD-10-CM | POA: Diagnosis not present

## 2024-01-25 DIAGNOSIS — E66811 Obesity, class 1: Secondary | ICD-10-CM | POA: Insufficient documentation

## 2024-03-16 DIAGNOSIS — I25119 Atherosclerotic heart disease of native coronary artery with unspecified angina pectoris: Secondary | ICD-10-CM | POA: Diagnosis not present

## 2024-03-16 DIAGNOSIS — I480 Paroxysmal atrial fibrillation: Secondary | ICD-10-CM | POA: Diagnosis not present

## 2024-03-16 DIAGNOSIS — I10 Essential (primary) hypertension: Secondary | ICD-10-CM | POA: Diagnosis not present

## 2024-03-16 DIAGNOSIS — Z952 Presence of prosthetic heart valve: Secondary | ICD-10-CM | POA: Diagnosis not present

## 2024-03-16 DIAGNOSIS — E7800 Pure hypercholesterolemia, unspecified: Secondary | ICD-10-CM | POA: Diagnosis not present

## 2024-03-16 DIAGNOSIS — Z955 Presence of coronary angioplasty implant and graft: Secondary | ICD-10-CM | POA: Diagnosis not present

## 2024-03-16 DIAGNOSIS — Z951 Presence of aortocoronary bypass graft: Secondary | ICD-10-CM | POA: Diagnosis not present

## 2024-03-16 DIAGNOSIS — Z9889 Other specified postprocedural states: Secondary | ICD-10-CM | POA: Diagnosis not present

## 2024-03-16 DIAGNOSIS — I5032 Chronic diastolic (congestive) heart failure: Secondary | ICD-10-CM | POA: Diagnosis not present

## 2024-05-06 DIAGNOSIS — L811 Chloasma: Secondary | ICD-10-CM | POA: Diagnosis not present

## 2024-06-22 DIAGNOSIS — E119 Type 2 diabetes mellitus without complications: Secondary | ICD-10-CM | POA: Diagnosis not present

## 2024-06-22 DIAGNOSIS — Z09 Encounter for follow-up examination after completed treatment for conditions other than malignant neoplasm: Secondary | ICD-10-CM | POA: Diagnosis not present

## 2024-06-22 DIAGNOSIS — M109 Gout, unspecified: Secondary | ICD-10-CM | POA: Insufficient documentation

## 2024-06-22 DIAGNOSIS — E11649 Type 2 diabetes mellitus with hypoglycemia without coma: Secondary | ICD-10-CM | POA: Diagnosis not present

## 2024-06-22 DIAGNOSIS — I5032 Chronic diastolic (congestive) heart failure: Secondary | ICD-10-CM | POA: Diagnosis not present

## 2024-06-22 DIAGNOSIS — I110 Hypertensive heart disease with heart failure: Secondary | ICD-10-CM | POA: Diagnosis not present

## 2024-06-22 DIAGNOSIS — Z1331 Encounter for screening for depression: Secondary | ICD-10-CM | POA: Diagnosis not present

## 2024-06-22 DIAGNOSIS — I480 Paroxysmal atrial fibrillation: Secondary | ICD-10-CM | POA: Diagnosis not present

## 2024-06-22 DIAGNOSIS — I25119 Atherosclerotic heart disease of native coronary artery with unspecified angina pectoris: Secondary | ICD-10-CM | POA: Diagnosis not present

## 2024-06-22 DIAGNOSIS — N529 Male erectile dysfunction, unspecified: Secondary | ICD-10-CM | POA: Diagnosis not present

## 2024-06-22 NOTE — Progress Notes (Signed)
 Subjective:  CC: Chief Complaint  Patient presents with  . Follow-up       Patient ID: Curtis Maxwell is a 63 y.o. male. This an established patient is here today for Office Visit.  HPI:   Patient is watching diet some, and getting regular exercise- walking a lot, light weights/ dumbbells, yardwork.  He is fasting except for morning medicines today. Pt reports he is overall doing well. BP is a little elevated in the office this am, says it may be because he is upset. Hypertension/ Heart Disease is stable. Pt is taking Blood Pressure medications as instructed. No reportable episodes of hyper or hypotension. Pt denies shortness of breath. No dizziness or lightheadedness. Pt endorses checking blood pressure at home. Average readings are 120-130/70-80. Occasional chest pain, but not as bad as it used to be since starting the Ranolazine. In regards to Diabetes, pt is doing well. Pt is taking Diabetes medications as prescribed. No known episodes of Hyper or Hypoglycemia. Pt is checking blood glucose at home. Average blood glucose readings are 95-120 in the mornings. Goes to 170-180 after he eats. Pt reports regular visits to the eye doctor and dentist. Pt endorses checking feet/regular foot care. Has been Ozempic , but has it sometimes makes his stomach hurt. He is worried about Pancreatitis. He has a friend that ended up in the hospital because of pancreatitis and brother in law took Ozempic  and ended up was diagnosed with pancreatic cancer. Did well on Farxiga . Would like to go back on this. Pt is taking cholesterol medications as instructed. No complaints of myalgias. Pt is taking vitamin supplements as prescribed or instructed. Reflux is stable. Pt reports symptoms are well controlled on current regimen. Minimal breakthrough symptoms/ minimal use of antacids. Pt is attempting to avoid reflux triggers. Viagra somewhat effective. Would like to try Cialis instead. Rare use of the Xanax . Pt started having some  soreness in the lateral aspect of the right 5th toe. Used to have it alot when working, but hasn't had it in a while since he is not working. Swollen, painful, difficulty to walk. Better than it was. Ongoing for about a week. Pt got the Covid booster and then the Flu vaccine a few weeks ago. Colonoscopy utd until 2027. TD vaccine utd until 2030. Pt has had the PNA and Shingles vaccines x 2.     Review of Systems: Review of Systems  Constitutional:  Negative for activity change, appetite change, chills, diaphoresis, fatigue, fever and unexpected weight change.  HENT:  Negative for congestion, ear pain, facial swelling, sinus pressure, sinus pain, tinnitus, trouble swallowing and voice change.   Eyes:  Negative for pain, discharge and redness.  Respiratory:  Negative for cough, choking, chest tightness, shortness of breath and wheezing.   Cardiovascular:  Negative for chest pain, palpitations and leg swelling.  Gastrointestinal:  Negative for abdominal distention, abdominal pain, constipation, diarrhea, nausea and vomiting.  Endocrine: Negative.   Genitourinary:  Negative for difficulty urinating.  Musculoskeletal:  Negative for arthralgias, gait problem and joint swelling.  Skin:  Negative for pallor and wound.  Allergic/Immunologic: Negative.   Neurological:  Negative for dizziness, seizures, syncope, facial asymmetry, speech difficulty, weakness, light-headedness, numbness and headaches.  Psychiatric/Behavioral:  Negative for agitation, confusion, decreased concentration and self-injury.     Allergies: Patient has no known allergies.  Active Diagnoses/Problem List Patient Active Problem List  Diagnosis  . HTN (hypertension)  . Hyperlipidemia  . GERD (gastroesophageal reflux disease)  . H/O cardiac catheterization  .  Coronary artery disease involving native coronary artery of native heart with angina pectoris  . Primary osteoarthritis of right knee  . S/P aortic valve replacement  .  Abnormal skin color  . Vitamin D  deficiency  . Low vitamin B12 level  . Uncontrolled type 2 diabetes mellitus with hypoglycemia, without long-term current use of insulin  (CMS/HHS-HCC)  . Erectile dysfunction  . S/P CABG x 3  . Chronic diastolic CHF (congestive heart failure) (CMS/HHS-HCC)  . Paroxysmal A-fib (CMS/HHS-HCC)  . Lateral epicondylitis of right elbow  . S/P drug eluting coronary stent placement  . Obesity (BMI 30.0-34.9)  . Gout    Past Medical/Surgical History: Past Medical History:  Diagnosis Date  . Coronary artery disease involving native coronary artery of native heart with angina pectoris 02/20/2015  . GERD (gastroesophageal reflux disease)   . H/O cardiac catheterization 02/17/2014   STATUS POST 2.0 X 28mm MINI VISION STENT SECOND OBTUSE MARGINAL BRANCH AND 2.0 X 18mm. MINI-VISION STENT FIRST OBTUSE MARGINAL BRANCH 03/15/2010  . History of pleural effusion 06/21/2020  . Hyperlipidemia   . Hypertension    Past Surgical History:  Procedure Laterality Date  . EGD  01/29/2007  . COLONOSCOPY  05/07/2013   Dr. MYRTIS Piedmont @ [facility] - Adenomatous Polyps, rpt 5 yrs per P[facility]  . COLONOSCOPY  03/16/2021   Tubular adenoma/PHx CP/FHx CC-Mother/Repeat 61yrs/JWB  . CARDIAC CATHETERIZATION    . CORONARY ANGIOPLASTY    . TRIPLE BYPASS      JULY 2021    Social History: Social History   Socioeconomic History  . Marital status: Married  Tobacco Use  . Smoking status: Former    Types: Cigarettes    Passive exposure: Past  . Smokeless tobacco: Never  . Tobacco comments:    QUIT cigs 8 YRS AGO, using electronic cigarettes  Vaping Use  . Vaping status: Some Days  Substance and Sexual Activity  . Alcohol  use: Not Currently    Comment: BEER OCCASIONALLY  . Drug use: No  . Sexual activity: Yes    Partners: Female   Social Drivers of Corporate Investment Banker Strain: Low Risk  (06/22/2024)   Overall Financial Resource Strain (CARDIA)   . Difficulty of Paying Living Expenses:  Not hard at all  Food Insecurity: No Food Insecurity (06/22/2024)   Hunger Vital Sign   . Worried About Programme Researcher, Broadcasting/film/video in the Last Year: Never true   . Ran Out of Food in the Last Year: Never true  Transportation Needs: No Transportation Needs (06/22/2024)   PRAPARE - Transportation   . Lack of Transportation (Medical): No   . Lack of Transportation (Non-Medical): No  Physical Activity: Sufficiently Active (06/22/2024)   Exercise Vital Sign   . Days of Exercise per Week: 5 days   . Minutes of Exercise per Session: 40 min  Stress: Stress Concern Present (06/22/2024)   [employer] of Occupational Health - Occupational Stress Questionnaire   . Feeling of Stress : Very much  Social Connections: Moderately Integrated (06/22/2024)   Social Connection and Isolation Panel   . Frequency of Communication with Friends and Family: More than three times a week   . Frequency of Social Gatherings with Friends and Family: Once a week   . Attends Religious Services: 1 to 4 times per year   . Active Member of Clubs or Organizations: No   . Attends Banker Meetings: Never   . Marital Status: Married  Housing Stability: Low Risk  (06/22/2024)  Housing Stability Vital Sign   . Unable to Pay for Housing in the Last Year: No   . Number of Times Moved in the Last Year: 0   . Homeless in the Last Year: No    Family History: Family History  Problem Relation Name Age of Onset  . Colon cancer Brother      Immunizations: Immunization History  Administered Date(s) Administered  . COVID-19 Moderna Vaccine (1st,2nd,3rd dose = 0.39ml) 11/18/2019, 12/21/2019, 07/07/2020, 01/22/2021  . COVID-19 vaccine (Moderna, BIVALENT) IM injection 50 mcg/0.5mL or 27mcg/0.25mL 05/18/2021, 05/05/2023  . Covid-19 Vaccine 22mcg/0.3ml (>=84yrs) Comirnaty Pfizer-Biontech 12/07/2023  . Covid-19 Vaccine 5mcg/0.5ml (>=69yrs) SPIKEVAX Moderna (VFC Use Only) 06/13/2022, 05/05/2023, 05/24/2024  . Flu  Vaccine RIV3, IM PF,(62yo+)(Flublok) 05/05/2023  . HEP A + HEP B (>=64YR) VACCINE (TWINRIX) 10/19/2008, 10/19/2008, 11/16/2008, 11/16/2008, 04/19/2009, 04/19/2009  . Influenza IIV4, IM pres-free 06/12/2020  . Influenza IIV4, cell derived (Egg-Free) PF (6 mo+) (Flucelvax QUAD) 06/07/2021  . Influenza, IM unspecified 06/12/2020, 06/12/2020, 06/03/2022, 06/07/2024  . Influenza, recombinant (Egg-Free) IM PF (18 Yr+) (Flublok Quad) 05/17/2022  . PNEUMOCOCCAL (PPSV23)(>=66YRS -OR- >=2 YRS WITH RISK) VACCINE (PNEUMOVAX 23) 10/06/2019  . Pneumococcal (PCV20) (>=6WKS) vaccine (Prevnar 20) (aka PCV 20) 12/19/2022  . RSV Adult Vaccine(>=50yr)(Arexvy) 08/03/2022  . RZV(>=49YR -OR-19+YRS IF  IMMCOMP) VACCINE (SHINGRIX) 07/10/2022, 09/09/2022  . TDAP (>=5YR) VACCINE (ADACEL/BOOSTRIX) 04/09/2019, 04/09/2019    Goals:  Goals     .  Maintain health/healthy lifestyle      Lose a few pounds.    .  Maintain health/healthy lifestyle    . * Patient Goals (pt-stated)      Continue with cardiac rehab from heart surgery         Current Medications: Outpatient Medications Marked as Taking for the 06/22/24 encounter (Office Visit) with Steva Clotilda Conn, NP  Medication Sig Dispense Refill  . ACCU-CHEK GUIDE TEST STRIPS test strip CHECK BLOOD GLUCOSE ONCE DAILY AND AS NEEDED 100 strip 3  . ACCU-CHEK SOFTCLIX LANCETS lancets TEST ONE TIME DAILY AND AS NEEDED 200 each 3  . alcohol  swabs (DROPSAFE ALCOHOL  PREP PADS) PadM APPLY TOPICALLY ONE TIME DAILY AND AS NEEDED BEFORE USING LANCET TO PRICK FINGER 100 each 3  . ALPRAZolam  (XANAX ) 0.5 MG tablet TAKE 1 TABLET TWICE DAILY AS NEEDED FOR SLEEP 60 tablet 0  . amLODIPine  (NORVASC ) 10 MG tablet Take 1 tablet (10 mg total) by mouth once daily 90 tablet 3  . amoxicillin  (AMOXIL ) 500 MG tablet TAKE 2000 MG BY MOUTH ONE HOUR PRIOR TO DENTAL PROCEDURES. 4 tablet 3  . apixaban (ELIQUIS) 5 mg tablet Take 1 tablet (5 mg total) by mouth every 12 (twelve) hours 180 tablet  3  . blood glucose meter kit as directed 1 each 0  . blood-glucose meter (ACCU-CHEK GUIDE ME GLUCOSE MTR) Misc 1 each as directed 1 each 0  . carvediloL  (COREG ) 3.125 MG tablet Take 1 tablet (3.125 mg total) by mouth 2 (two) times daily with meals 180 tablet 3  . cholecalciferol  (VITAMIN D3) 2,000 unit capsule Take 1 capsule (2,000 Units total) by mouth once daily 360 capsule 11  . clopidogreL (PLAVIX) 75 mg tablet Take 1 tablet (75 mg total) by mouth once daily 90 tablet 3  . colchicine  (COLCRYS ) 0.6 mg tablet Take 1 tablet by mouth twice daily for 5 days 30 tablet 0  . cyanocobalamin  (VITAMIN B12) 1000 MCG tablet Take 2 tablets daily for 2 weeks, then reduce to 1 tablet daily thereafter for Vitamin B12  Deficiency. 360 tablet 11  . ezetimibe (ZETIA) 10 mg tablet Take 1 tablet (10 mg total) by mouth once daily 90 tablet 3  . FUROsemide  (LASIX ) 40 MG tablet Take 1 tablet (40 mg total) by mouth once daily 90 tablet 3  . lisinopriL  (ZESTRIL ) 40 MG tablet Take 1 tablet (40 mg total) by mouth once daily 90 tablet 3  . magnesium  chloride (SLOW-MAG) 71.5 mg DR tablet Take 2 tablets by mouth 2 (two) times daily Each tablet contains 71.5 mg of magnesium     . nitroGLYcerin  (NITROSTAT ) 0.4 MG SL tablet DISSOLVE 1 TABLET UNDER THE TONGUE EVERY 5 MINUTES AS NEEDED FOR CHEST PAIN. MAY TAKE UP TO 3 DOSES. 25 tablet 11  . omeprazole  (PRILOSEC) 20 MG DR capsule Take 1 capsule (20 mg total) by mouth 2 (two) times daily before meals 180 capsule 3  . pioglitazone (ACTOS) 15 MG tablet Take 1 tablet (15 mg total) by mouth once daily 90 tablet 3  . predniSONE  (DELTASONE ) 20 MG tablet Take 1 tablet (20 mg total) by mouth once daily 10 tablet 0  . ranolazine (RANEXA) 500 MG ER tablet TAKE 1 TABLET (500 MG TOTAL) BY MOUTH 2 (TWO) TIMES DAILY 180 tablet 3  . rosuvastatin (CRESTOR) 40 MG tablet Take 1 tablet (40 mg total) by mouth once daily 90 tablet 3  . semaglutide  (OZEMPIC ) 1 mg/dose (4 mg/3 mL) pen injector INJECT 1MG   UNDER THE SKIN ONE TIME WEEKLY 3 mL 3  . spironolactone  (ALDACTONE ) 25 MG tablet Take 1 tablet (25 mg total) by mouth once daily 90 tablet 3  . [DISCONTINUED] predniSONE  (DELTASONE ) 20 MG tablet Take 1 tablet (20 mg total) by mouth once daily 10 tablet 0  . [DISCONTINUED] sildenafiL (VIAGRA) 100 MG tablet Take 1 tablet (100 mg total) by mouth once daily as needed for Erectile Dysfunction 30 tablet 1    Medication list above reconciled and reviewed for current and on-going appropriateness using patient's verbal report of what he is taking.      Objective:   Vitals:   06/22/24 1050  BP: (!) 162/90  Pulse: 82   Ht:167.6 cm (5' 6) Wt:97.3 kg (214 lb 6.4 oz) AFP:Anib mass index is 34.61 kg/m.   Physical Exam Vitals and nursing note reviewed.  Constitutional:      General: He is not in acute distress.    Appearance: Normal appearance. He is well-developed. He is not diaphoretic.  HENT:     Head: Normocephalic and atraumatic.     Right Ear: Hearing and external ear normal.     Left Ear: Hearing and external ear normal.     Nose: Nose normal.     Mouth/Throat:     Pharynx: Uvula midline. No oropharyngeal exudate.  Eyes:     General: Lids are normal. No scleral icterus.       Right eye: No discharge.        Left eye: No discharge.     Conjunctiva/sclera: Conjunctivae normal.     Pupils: Pupils are equal, round, and reactive to light.  Neck:     Thyroid : No thyromegaly.     Vascular: No JVD.     Trachea: Trachea normal. No tracheal tenderness or tracheal deviation.  Cardiovascular:     Rate and Rhythm: Normal rate. Rhythm irregular.     Pulses: Normal pulses.          Dorsalis pedis pulses are 2+ on the right side and 2+ on the left side.  Heart sounds: Murmur heard.     No friction rub. No gallop.  Pulmonary:     Effort: Pulmonary effort is normal. No accessory muscle usage or respiratory distress.     Breath sounds: Normal breath sounds. No stridor. No decreased breath  sounds, wheezing, rhonchi or rales.  Chest:     Chest wall: No tenderness.  Abdominal:     General: Bowel sounds are normal. There is no distension.     Palpations: Abdomen is soft.     Tenderness: There is no abdominal tenderness. There is no guarding.  Musculoskeletal:        General: No tenderness or deformity. Normal range of motion.     Cervical back: Normal range of motion and neck supple.     Comments: Walks with a cane  Lymphadenopathy:     Head:     Right side of head: No submental, submandibular, tonsillar or posterior auricular adenopathy.     Left side of head: No submental, submandibular, tonsillar or posterior auricular adenopathy.     Cervical: No cervical adenopathy.  Skin:    General: Skin is warm and dry.     Coloration: Skin is not pale.     Findings: No erythema or rash.     Nails: There is no clubbing.  Neurological:     Mental Status: He is alert and oriented to person, place, and time.     Cranial Nerves: No cranial nerve deficit.     Sensory: No sensory deficit.     Motor: No tremor, atrophy, abnormal muscle tone or seizure activity.     Coordination: Coordination normal.     Gait: Gait normal.     Deep Tendon Reflexes: Reflexes are normal and symmetric.  Psychiatric:        Speech: Speech normal.        Behavior: Behavior normal.        Thought Content: Thought content normal.        Judgment: Judgment normal.          No visits with results within 3 Month(s) from this visit.  Latest known visit with results is:  Office Visit on 01/19/2024  Component Date Value Ref Range Status  . Vent Rate (bpm) 01/19/2024 76   Final  . PR Interval (msec) 01/19/2024 204   Final  . QRS Interval (msec) 01/19/2024 148   Final  . QT Interval (msec) 01/19/2024 456   Final  . QTc (msec) 01/19/2024 513   Final     Assessment and Plan:   Diagnoses and all orders for this visit:  Encounter for follow-up  Gout, unspecified cause, unspecified chronicity,  unspecified site Assessment & Plan: Current exacerbation  - Prednisone  20 mg po Q Day x 5 days  - Colchicine  0.6 mg po BID x 5 days  Orders: -     predniSONE  (DELTASONE ) 20 MG tablet; Take 1 tablet (20 mg total) by mouth once daily  Vasculogenic erectile dysfunction, unspecified vasculogenic erectile dysfunction type Assessment & Plan: Stable  - Discontinue Viagra 100 mg po Q Day prn- per pt request  - Cialis 20 mg po Q Day prn  Orders: -     tadalafiL (CIALIS) 20 MG tablet; Take 1 tablet (20 mg total) by mouth once daily as needed for Erectile Dysfunction for up to 30 days  Depression screening (Z13.31) -     Depression Screen -(PHQ- 2/9, BDI)  Primary hypertension Assessment & Plan: Stable  - Continue Lasix  40  mg po Q Day prn  - Continue Spironolactone  to 25 mg po Q Day  - Continue Coreg  3.125 mg po BID  - Continue Lisinopril  40 mg po Q Day  - Check BMET today  Orders: -     Basic Metabolic Panel (BMP)  Coronary artery disease involving native coronary artery of native heart with angina pectoris Assessment & Plan: Stable  - Continue ASA 325 mg po Q Day  - Continue Lipitor 40 mg po Q day  - Continue to f/u with Cardiology as instructed   Orders: -     Basic Metabolic Panel (BMP)  Chronic diastolic CHF (congestive heart failure) (CMS/HHS-HCC) Assessment & Plan: Stable  - Continue Lasix  40 mg po Q Day prn  - Continue Spironolactone  to 25 mg po Q Day  - Continue Coreg  3.125 mg po BID  - Continue Lisinopril  40 mg po Q Day  - Check BMET today  Orders: -     FARXIGA  10 mg tablet; Take 1 tablet (10 mg total) by mouth once daily -     Basic Metabolic Panel (BMP)  Paroxysmal A-fib (CMS/HHS-HCC) Assessment & Plan: Stable  - Continue Coreg  3.125 mg po BID  - Continue Ranexa ER 500 mg po BID  - Continue Eliquis 5 mg po BID  - Check BMET today  - Continue to f/u with Cardiology as instructed  Orders: -     FARXIGA  10 mg tablet; Take 1 tablet (10 mg total) by  mouth once daily -     Basic Metabolic Panel (BMP)  Uncontrolled type 2 diabetes mellitus with hypoglycemia, without long-term current use of insulin  (CMS/HHS-HCC) Assessment & Plan: Stable  - Discontinue Ozempic  1 mg SQ Q week, per pt request  - Restart Farxiga  10 mg po Q Day  - Continue Actos 15 mg po Q day  - Check A1c today  Orders: -     FARXIGA  10 mg tablet; Take 1 tablet (10 mg total) by mouth once daily -     Hemoglobin A1C  Pure hypercholesterolemia Assessment & Plan: Stable  - Continue Crestor 40 mg po Q Day     Vitamin D  deficiency Assessment & Plan: Stable  - Continue Vitamin D  2000 iu PO Q Day     Low vitamin B12 level Assessment & Plan: Stable  - Continue Vitamin B12 1000 mcg po Q day    Gastroesophageal reflux disease, unspecified whether esophagitis present Assessment & Plan: Stable  - Continue Omeprazole  20 mg po BID     Personalized Health Advice diet, exercise, weight management, supplements, and mental health concerns  Return in about 6 months (around 12/21/2024), or if symptoms worsen or fail to improve, for Annual Wellness Visit, Fasting Labs., sooner as needed.    Current Medical Providers and Suppliers:   [facility] Patient Care Team: Steva Clotilda Conn, NP as PCP - General (Internal Medicine) Future Appointments     Date/Time Provider Department Center Visit Type   09/09/2024 10:30 AM Call[place], Cara Endow, MD [facility] C FOLLOW UP   12/21/2024 10:30 AM Steva Clotilda Conn, NP [facility] OFFICE VISIT        Attestation Statement:   I personally performed the service, non-incident to. (WP)   SHANNON HIATT COWARD, NP  An after visit summary with all of these plans was provided for the patient either in written format or through MyChart.      Clotilda H. Coward, AGNP-C  *Some images could not  be shown.

## 2024-07-06 ENCOUNTER — Ambulatory Visit
Admission: EM | Admit: 2024-07-06 | Discharge: 2024-07-06 | Disposition: A | Discharge status: Home / Self Care | Attending: Emergency Medicine | Admitting: Emergency Medicine

## 2024-07-06 DIAGNOSIS — R079 Chest pain, unspecified: Secondary | ICD-10-CM

## 2024-07-06 DIAGNOSIS — R108A2 Left flank tenderness: Secondary | ICD-10-CM | POA: Diagnosis not present

## 2024-07-06 DIAGNOSIS — R10812 Left upper quadrant abdominal tenderness: Secondary | ICD-10-CM | POA: Diagnosis not present

## 2024-07-06 NOTE — Discharge Instructions (Signed)
 Your EKG showed a left bundle branch block with PVCs, but otherwise no other changes.  I am concerned that this could be a problem with your heart, fluid in your lungs, a collapsed lung, pneumonia, colitis, a problem with your stomach, kidney stone, or some other emergency.  I feel that you warrant further evaluation in the emergency department to rule these entities and other emergencies out.  Please go there immediately and let them know if your pain changes or gets worse.

## 2024-07-06 NOTE — ED Provider Notes (Signed)
 HPI  SUBJECTIVE:  Curtis Maxwell is a 63 y.o. male who presents with 3 weeks of constant, daily left-sided anterior rib pain described as throbbing and sharp.  It is worse on lying on his left side and with torso rotation.  This pain does not radiate up his neck, down his arm or through to his back.  No abdominal pain, trauma to the area, change in his physical activity.  No fevers, coughing, wheezing, shortness of breath.  No nausea, diaphoresis.  No exertional component, and it is not associated with deep inspiration.  No change in his baseline dyspnea on exertion.  No rash in this area.  He reports 2 recent long car trips over about a month ago, but states that he was fine in between the trips and his symptoms starting.  No surgery in the past 4 weeks.  No calf pain or swelling, hemoptysis.  He is not a smoker.  No exogenous estrogen.  He denies lower extremity edema, nocturia, orthopnea, PND although he reports a 5 pound unintentional weight gain over the past 3 weeks.  He tried Tylenol  once with improvement in his symptoms.  Symptoms are worse with lying on his left side.  He had a normal bowel movement today with no change in his pain.  He has a past medical history of coronary artery disease, MI status post CABG x 3, paroxysmal atrial fibrillation on Eliquis and Plavix, diabetes, hypercholesterolemia, hypertension, status post aortic valve replacement, GERD, smoking, pleural effusion, CHF, gout.  No history of cancer, DVT, PE.  [facility].  Cardiology: [facility].  Past Medical History:  Diagnosis Date   Coronary artery disease    Diabetes mellitus without complication (HCC)    High cholesterol    Hypertension    Myocardial infarct, old    Plantar fasciitis     Past Surgical History:  Procedure Laterality Date   AORTIC VALVE REPLACEMENT N/A 03/03/2020   Procedure: AORTIC VALVE REPLACEMENT (AVR) Inspiris 21mm;  Surgeon: Fleeta Hanford Coy, MD;  Location: [facility] OR;  Service: Open  Heart Surgery;  Laterality: N/A;   CARDIAC SURGERY     COLONOSCOPY WITH PROPOFOL  N/A 03/16/2021   Procedure: COLONOSCOPY WITH PROPOFOL ;  Surgeon: Dessa Reyes ORN, MD;  Location: [facility] ENDOSCOPY;  Service: Endoscopy;  Laterality: N/A;   CORONARY ANGIOPLASTY WITH STENT PLACEMENT     CORONARY ARTERY BYPASS GRAFT N/A 03/03/2020   Procedure: CORONARY ARTERY BYPASS GRAFTING (CABG) x3. Endoscopic saphenous vein harvest  ;  Surgeon: Fleeta Hanford Coy, MD;  Location: [facility] OR;  Service: Open Heart Surgery;  Laterality: N/A;   FLEXIBLE BRONCHOSCOPY N/A 03/03/2020   Procedure: FLEXIBLE BRONCHOSCOPY;  Surgeon: Fleeta Hanford Coy, MD;  Location: [facility] OR;  Service: Open Heart Surgery;  Laterality: N/A;   IR THORACENTESIS ASP PLEURAL SPACE W/IMG GUIDE  04/07/2020   LEFT HEART CATH AND CORS/GRAFTS ANGIOGRAPHY N/A 12/02/2023   Procedure: LEFT HEART CATH AND CORS/GRAFTS ANGIOGRAPHY;  Surgeon: Florencio Cara BIRCH, MD;  Location: [facility] INVASIVE CV LAB;  Service: Cardiovascular;  Laterality: N/A;   RIGHT/LEFT HEART CATH AND CORONARY ANGIOGRAPHY N/A 12/31/2017   Procedure: RIGHT/LEFT HEART CATH AND CORONARY ANGIOGRAPHY;  Surgeon: Ammon Blunt, MD;  Location: [facility] INVASIVE CV LAB;  Service: Cardiovascular;  Laterality: N/A;   RIGHT/LEFT HEART CATH AND CORONARY ANGIOGRAPHY N/A 02/15/2020   Procedure: RIGHT/LEFT HEART CATH AND CORONARY ANGIOGRAPHY;  Surgeon: Ammon Blunt, MD;  Location: [facility] INVASIVE CV LAB;  Service: Cardiovascular;  Laterality: N/A;   TEE WITHOUT CARDIOVERSION N/A  03/03/2020   Procedure: TRANSESOPHAGEAL ECHOCARDIOGRAM (TEE);  Surgeon: Fleeta Ochoa, Maude, MD;  Location: [facility] OR;  Service: Open Heart Surgery;  Laterality: N/A;    Family History  Problem Relation Age of Onset   Gout Brother     Social History   Tobacco Use   Smoking status: Former    Current packs/day: 0.00    Average packs/day: 1 pack/day for 15.0 years (15.0 ttl pk-yrs)    Types: E-cigarettes, Cigarettes    Start date: 09/07/1983     Quit date: 09/06/1998    Years since quitting: 25.8   Smokeless tobacco: Never   Tobacco comments:    stopped smoking vapes 2 months ago  Vaping Use   Vaping status: Former  Substance Use Topics   Alcohol  use: Yes    Alcohol /week: 10.0 standard drinks of alcohol     Types: 10 Cans of beer per week   Drug use: Never    No current facility-administered medications for this encounter.  Current Outpatient Medications:    FARXIGA  10 MG TABS tablet, Take 10 mg by mouth daily., Disp: , Rfl:    tadalafil (CIALIS) 20 MG tablet, Take 1 tablet (20 mg total) by mouth once daily as needed for Erectile Dysfunction for up to 30 days, Disp: , Rfl:    ALPRAZolam  (XANAX ) 0.5 MG tablet, Take 1 tablet (0.5 mg total) by mouth 2 (two) times daily as needed for Sleep, Disp: 60 tablet, Rfl: 0   amLODipine  (NORVASC ) 10 MG tablet, Take 1 tablet (10 mg total) by mouth daily., Disp: 90 tablet, Rfl: 1   aspirin  EC 325 MG EC tablet, Take 1 tablet (325 mg total) by mouth daily., Disp: 30 tablet, Rfl: 0   atorvastatin  (LIPITOR) 20 MG tablet, Take 1 tablet (20 mg total) by mouth once daily (Patient taking differently: Take 20 mg by mouth at bedtime.), Disp: 90 tablet, Rfl: 1   carvedilol  (COREG ) 3.125 MG tablet, Take 1 tablet (3.125 mg total) by mouth 2 (two) times daily with meals, Disp: 180 tablet, Rfl: 1   Cholecalciferol  125 MCG (5000 UT) TABS, Take 5,000 Units by mouth daily., Disp: , Rfl:    colchicine  0.6 MG tablet, Take 1 tablet (0.6 mg total) by mouth 2 (two) times daily as needed, Disp: 20 tablet, Rfl: 0   ELIQUIS 5 MG TABS tablet, Take 5 mg by mouth 2 (two) times daily., Disp: , Rfl:    furosemide  (LASIX ) 40 MG tablet, Take 1 tablet (40 mg total) by mouth once daily as needed for edema (Patient taking differently: Take 20 mg by mouth daily.), Disp: 90 tablet, Rfl: 1   lisinopril  (ZESTRIL ) 40 MG tablet, Take 1 tablet (40 mg total) by mouth daily., Disp: 90 tablet, Rfl: 1   metFORMIN  (GLUCOPHAGE ) 500 MG tablet,  Take 1 tablet (500 mg total) by mouth 2 (two) times daily with meals, Disp: 180 tablet, Rfl: 1   nitroGLYCERIN  (NITROSTAT ) 0.4 MG SL tablet, Place 1 tablet (0.4 mg total) under the tongue every 5 (five) minutes as needed for Chest pain. May take up to 3 doses., Disp: 25 tablet, Rfl: 1   omeprazole  (PRILOSEC) 20 MG capsule, Take 1 capsule (20 mg total) by mouth 2 (two) times daily before meals, Disp: 180 capsule, Rfl: 3   OZEMPIC , 0.25 OR 0.5 MG/DOSE, 2 MG/3ML SOPN, Inject 0.5 mg into the skin once a week., Disp: , Rfl:    PRESCRIPTION MEDICATION, Apply 1 Application topically daily as needed. Compounded face cream from [facility], Disp: , Rfl:    ranolazine (RANEXA) 500 MG 12 hr tablet, Take 500 mg by mouth 2 (two) times daily., Disp: , Rfl:    rosuvastatin (CRESTOR) 40 MG tablet, Take 40 mg by mouth daily., Disp: , Rfl:    sildenafil (REVATIO) 20 MG tablet, Take 100 mg by mouth daily as needed (erectile dysfuntion.)., Disp: , Rfl:    spironolactone  (ALDACTONE ) 25 MG tablet, Take 0.5 tablets (12.5 mg total) by mouth daily. (Patient taking differently: Take 25 mg by mouth daily.), Disp: 30 tablet, Rfl: 1  No Known Allergies   ROS  As noted in HPI.   Physical Exam  BP 124/74 (BP Location: Left Arm)   Pulse 65   Temp 98.6 F (37 C) (Oral)   Resp 18   Wt 99.1 kg   SpO2 98%   BMI 35.25 kg/m   Constitutional: Well developed, well nourished, no acute distress.  Able to take a deep breath without pain. Eyes: PERRL, EOMI, conjunctiva normal bilaterally HENT: Normocephalic, atraumatic,mucus membranes moist Respiratory: Clear to auscultation bilaterally, no rales, no wheezing, no rhonchi.  Positive anterior left lower rib tenderness.   Cardiovascular: Normal rate and rhythm, no gallops, no rubs.  Positive murmur GI: nondistended, soft.  Positive left upper quadrant/left flank tenderness, no guarding, rebound.  Active bowel sounds.  No other abdominal tenderness. skin: No rash in the  area of pain or in a dermatomal distribution, skin intact Musculoskeletal: Calves symmetric, nontender, no edema Neurologic: Alert & oriented x 3, CN III-XII grossly intact, no motor deficits, sensation grossly intact Psychiatric: Speech and behavior appropriate   ED Course   Medications - No data to display  Orders Placed This Encounter  Procedures   ED EKG    Standing Status:   Standing    Number of Occurrences:   1    Reason for Exam:   Chest Pain   EKG 12-Lead    Standing Status:   Standing    Number of Occurrences:   1   No results found for this or any previous visit (from the past 24 hours). No results found.  ED Clinical Impression  1. Left-sided chest pain   2. Left flank tenderness   3. Left upper quadrant abdominal tenderness without rebound tenderness      ED Assessment/Plan     Patient presents with left lower rib/left upper quadrant pain for the past 3 weeks.  Denies trauma to the area.  No evidence of shingles.  Low suspicion for PE due to him being on both Eliquis and Plavix.  Differential includes but is not limited to pneumonia, pleural effusion, pneumothorax, peptic ulcer disease, colitis, nephrolithiasis, ACS, costochondritis.  Given his multiple medical comorbidities, I feel that he warrants further evaluation in the emergency department to rule out these entities and other possible emergencies.  Unfortunately, we do not have the resources to do so here in the urgent care.  Will check EKG to make sure that he is stable to go via private vehicle.  EKG: Sinus rhythm with PVCs, rate 63.  Left axis deviation.  Left bundle branch block was present on previous EKG in 11/2022.  No other changes compared to previous EKG.  I believe patient is stable go to the ED via private vehicle.  Discussed EKG, rationale for transfer to the emergency department with the patient.  He agrees to go. No orders of the defined types were placed in this encounter.   *This clinic  note was created  using Scientist, clinical (histocompatibility and immunogenetics). Therefore, there may be occasional mistakes despite careful proofreading. ?    Van Knee, MD 07/06/24 1232

## 2024-07-06 NOTE — ED Triage Notes (Signed)
 Pt c/o L sided rib pain x3 wks. Denies any falls,injuries or heavy lifting. States pain started all of a sudden. Has tried OTC meds w/o relief.

## 2024-07-06 NOTE — ED Notes (Signed)
 Patient is being discharged from the Urgent Care and sent to the Emergency Department via POV . Per Dr.Mortenson, patient is in need of higher level of care due to L sided chest pain. Patient is aware and verbalizes understanding of plan of care.  Vitals:   07/06/24 1131  BP: 124/74  Pulse: 65  Resp: 18  Temp: 98.6 F (37 C)  SpO2: 98%

## 2024-07-08 ENCOUNTER — Emergency Department

## 2024-07-08 ENCOUNTER — Other Ambulatory Visit: Payer: Self-pay

## 2024-07-08 ENCOUNTER — Emergency Department
Admission: EM | Admit: 2024-07-08 | Discharge: 2024-07-08 | Disposition: A | Discharge status: Home / Self Care | Attending: Emergency Medicine | Admitting: Emergency Medicine

## 2024-07-08 DIAGNOSIS — Z7902 Long term (current) use of antithrombotics/antiplatelets: Secondary | ICD-10-CM | POA: Diagnosis not present

## 2024-07-08 DIAGNOSIS — Z7901 Long term (current) use of anticoagulants: Secondary | ICD-10-CM | POA: Diagnosis not present

## 2024-07-08 DIAGNOSIS — Z952 Presence of prosthetic heart valve: Secondary | ICD-10-CM | POA: Insufficient documentation

## 2024-07-08 DIAGNOSIS — Z951 Presence of aortocoronary bypass graft: Secondary | ICD-10-CM | POA: Insufficient documentation

## 2024-07-08 DIAGNOSIS — R109 Unspecified abdominal pain: Secondary | ICD-10-CM | POA: Diagnosis present

## 2024-07-08 DIAGNOSIS — R0789 Other chest pain: Secondary | ICD-10-CM | POA: Diagnosis not present

## 2024-07-08 DIAGNOSIS — R0781 Pleurodynia: Secondary | ICD-10-CM | POA: Insufficient documentation

## 2024-07-08 DIAGNOSIS — I2510 Atherosclerotic heart disease of native coronary artery without angina pectoris: Secondary | ICD-10-CM | POA: Diagnosis not present

## 2024-07-08 DIAGNOSIS — I700 Atherosclerosis of aorta: Secondary | ICD-10-CM | POA: Insufficient documentation

## 2024-07-08 DIAGNOSIS — T8484XA Pain due to internal orthopedic prosthetic devices, implants and grafts, initial encounter: Secondary | ICD-10-CM | POA: Diagnosis present

## 2024-07-08 DIAGNOSIS — R0989 Other specified symptoms and signs involving the circulatory and respiratory systems: Secondary | ICD-10-CM | POA: Diagnosis not present

## 2024-07-08 DIAGNOSIS — I447 Left bundle-branch block, unspecified: Secondary | ICD-10-CM | POA: Diagnosis not present

## 2024-07-08 DIAGNOSIS — Z9889 Other specified postprocedural states: Secondary | ICD-10-CM | POA: Diagnosis not present

## 2024-07-08 DIAGNOSIS — R10A2 Flank pain, left side: Secondary | ICD-10-CM | POA: Diagnosis not present

## 2024-07-08 LAB — CBC
HCT: 39.2 % (ref 39.0–52.0)
Hemoglobin: 12.2 g/dL — ABNORMAL LOW (ref 13.0–17.0)
MCH: 26 pg (ref 26.0–34.0)
MCHC: 31.1 g/dL (ref 30.0–36.0)
MCV: 83.4 fL (ref 80.0–100.0)
Platelets: 236 K/uL (ref 150–400)
RBC: 4.7 MIL/uL (ref 4.22–5.81)
RDW: 14.6 % (ref 11.5–15.5)
WBC: 11.3 K/uL — ABNORMAL HIGH (ref 4.0–10.5)
nRBC: 0 % (ref 0.0–0.2)

## 2024-07-08 LAB — COMPREHENSIVE METABOLIC PANEL WITH GFR
ALT: 15 U/L (ref 0–44)
AST: 18 U/L (ref 15–41)
Albumin: 4.2 g/dL (ref 3.5–5.0)
Alkaline Phosphatase: 60 U/L (ref 38–126)
Anion gap: 13 (ref 5–15)
BUN: 16 mg/dL (ref 8–23)
CO2: 21 mmol/L — ABNORMAL LOW (ref 22–32)
Calcium: 8.7 mg/dL — ABNORMAL LOW (ref 8.9–10.3)
Chloride: 105 mmol/L (ref 98–111)
Creatinine, Ser: 1.06 mg/dL (ref 0.61–1.24)
GFR, Estimated: >60 mL/min (ref >60)
Glucose, Bld: 137 mg/dL — ABNORMAL HIGH (ref 70–99)
Potassium: 3.1 mmol/L — ABNORMAL LOW (ref 3.5–5.1)
Sodium: 139 mmol/L (ref 135–145)
Total Bilirubin: 0.8 mg/dL (ref 0.0–1.2)
Total Protein: 8.1 g/dL (ref 6.5–8.1)

## 2024-07-08 LAB — URINALYSIS, ROUTINE W REFLEX MICROSCOPIC
Bacteria, UA: NONE SEEN
Bilirubin Urine: NEGATIVE
Glucose, UA: >=500 mg/dL — AB
Ketones, ur: NEGATIVE mg/dL
Leukocytes,Ua: NEGATIVE
Nitrite: NEGATIVE
Protein, ur: NEGATIVE mg/dL
Specific Gravity, Urine: 1.009 (ref 1.005–1.030)
Squamous Epithelial / HPF: 0 /HPF (ref 0–5)
pH: 5 (ref 5.0–8.0)

## 2024-07-08 LAB — MAGNESIUM: Magnesium: 2.2 mg/dL (ref 1.7–2.4)

## 2024-07-08 LAB — TROPONIN I (HIGH SENSITIVITY): Troponin I (High Sensitivity): 26 ng/L — ABNORMAL HIGH (ref <18)

## 2024-07-08 LAB — LIPASE, BLOOD: Lipase: 32 U/L (ref 11–51)

## 2024-07-08 MED ORDER — LIDOCAINE 5 % EX PTCH: 1.0000 | MEDICATED_PATCH | Freq: Two times a day (BID) | CUTANEOUS | 1 refills | Status: AC

## 2024-07-08 MED ORDER — LIDOCAINE 5 % EX PTCH
2.0000 | MEDICATED_PATCH | CUTANEOUS | Status: DC
  Administered 2024-07-08: 2 via TRANSDERMAL
  Filled 2024-07-08: qty 2

## 2024-07-08 MED ORDER — METHOCARBAMOL 500 MG PO TABS: 500.0000 mg | ORAL_TABLET | Freq: Three times a day (TID) | ORAL | 0 refills | Status: AC | PRN

## 2024-07-08 MED ORDER — ACETAMINOPHEN 500 MG PO TABS
1000.0000 mg | ORAL_TABLET | Freq: Once | ORAL | Status: AC
  Administered 2024-07-08: 1000 mg via ORAL
  Filled 2024-07-08: qty 2

## 2024-07-08 MED ORDER — METHOCARBAMOL 500 MG PO TABS
500.0000 mg | ORAL_TABLET | Freq: Once | ORAL | Status: AC
  Administered 2024-07-08: 500 mg via ORAL
  Filled 2024-07-08: qty 1

## 2024-07-08 MED ORDER — POTASSIUM CHLORIDE CRYS ER 20 MEQ PO TBCR
40.0000 meq | EXTENDED_RELEASE_TABLET | Freq: Once | ORAL | Status: AC
  Administered 2024-07-08: 40 meq via ORAL
  Filled 2024-07-08: qty 2

## 2024-07-08 NOTE — ED Provider Notes (Signed)
 [facility] Provider Note    Event Date/Time   First MD Initiated Contact with Patient 07/08/24 1108     (approximate)   History   Flank Pain   HPI  Curtis Maxwell is a 63 y.o. male who presents to the ED for evaluation of Flank Pain   I reviewed an urgent care visit from 2 days ago where he was seen for 3 weeks of left-sided flank pain.  He has a history of CAD with CABG and AVR.  He is on both Eliquis and Plavix.  Patient presents for evaluation of a few weeks of atraumatic left-sided chest discomfort.  He reports a sharp sensation like something is poking him from the inside and he has concern that sternal wires from CABG moved to his left lateral chest and poking through.   Physical Exam   Triage Vital Signs: ED Triage Vitals  Encounter Vitals Group     BP 07/08/24 1023 (!) 142/91     Girls Systolic BP Percentile --      Girls Diastolic BP Percentile --      Boys Systolic BP Percentile --      Boys Diastolic BP Percentile --      Pulse Rate 07/08/24 1023 90     Resp 07/08/24 1023 16     Temp 07/08/24 1023 98.4 F (36.9 C)     Temp Source 07/08/24 1023 Oral     SpO2 07/08/24 1023 98 %     Weight 07/08/24 1024 218 lb 4.1 oz (99 kg)     Height --      Head Circumference --      Peak Flow --      Pain Score 07/08/24 1024 6     Pain Loc --      Pain Education --      Exclude from Growth Chart --     Most recent vital signs: Vitals:   07/08/24 1400 07/08/24 1430  BP: (!) 146/73 126/86  Pulse: 70 70  Resp:    Temp:    SpO2:      General: Awake, no distress.  CV:  Good peripheral perfusion.  Resp:  Normal effort.  Abd:  No distention.  MSK:  No deformity noted.  Neuro:  No focal deficits appreciated. Other:  Tenderness to palpation to the anterior and lateral aspects of rib cage around T6/7.  No skin changes or rash.  No signs of trauma. Beneath the costal margin on the left side he has a nontender LUQ abdomen and left  CVA.   ED Results / Procedures / Treatments   Labs (all labs ordered are listed, but only abnormal results are displayed) Labs Reviewed  COMPREHENSIVE METABOLIC PANEL WITH GFR - Abnormal; Notable for the following components:      Result Value   Potassium 3.1 (*)    CO2 21 (*)    Glucose, Bld 137 (*)    Calcium  8.7 (*)    All other components within normal limits  CBC - Abnormal; Notable for the following components:   WBC 11.3 (*)    Hemoglobin 12.2 (*)    All other components within normal limits  URINALYSIS, ROUTINE W REFLEX MICROSCOPIC - Abnormal; Notable for the following components:   Color, Urine STRAW (*)    APPearance CLEAR (*)    Glucose, UA >=500 (*)    Hgb urine dipstick SMALL (*)    All other components within normal limits  TROPONIN I (  HIGH SENSITIVITY) - Abnormal; Notable for the following components:   Troponin I (High Sensitivity) 26 (*)    All other components within normal limits  LIPASE, BLOOD  MAGNESIUM     EKG Sinus rhythm with a rate of 75 bpm.  Left bundle morphology without STEMI by Sgarbossa criteria.  1 PVC.  Similar to previous EKG  RADIOLOGY CXR interpreted by me without evidence of acute cardiopulmonary pathology. CT renal study interpreted by me without signs of acute pathology  Official radiology report(s): DG Chest 2 View Result Date: 07/08/2024 EXAM: 2 VIEW(S) XRAY OF THE CHEST 07/08/2024 12:47:00 PM COMPARISON: 11/17/2022 CLINICAL HISTORY: left flank / chest wall pain FINDINGS: LUNGS AND PLEURA: Development of mild pulmonary venous congestion, as evidenced by interstitial prominence and indistinctness. Thickening of the fissures. No focal pulmonary opacity. No pneumothorax. HEART AND MEDIASTINUM: Aortic calcification. Aortic valve replacement noted. Surgical changes in mediastinum. BONES AND SOFT TISSUES: Intact sternotomy wires. No acute osseous abnormality. IMPRESSION: 1. Mild pulmonary venous congestion, with interstitial prominence,  fissural thickening, and vascular indistinctness. Electronically signed by: Rockey Kilts MD 07/08/2024 01:31 PM EST RP Workstation: [id number]   CT Renal Stone Study Result Date: 07/08/2024 CLINICAL DATA:  Several week history of left flank pain EXAM: CT ABDOMEN AND PELVIS WITHOUT CONTRAST TECHNIQUE: Multidetector CT imaging of the abdomen and pelvis was performed following the standard protocol without IV contrast. RADIATION DOSE REDUCTION: This exam was performed according to the departmental dose-optimization program which includes automated exposure control, adjustment of the mA and/or kV according to patient size and/or use of iterative reconstruction technique. COMPARISON:  CT abdomen and pelvis dated 07/14/2010 FINDINGS: Lower chest: No focal consolidation or pulmonary nodule in the lung bases. No pleural effusion or pneumothorax demonstrated. Partially imaged heart size is normal. Hepatobiliary: No focal hepatic lesions. No intra or extrahepatic biliary ductal dilation. Normal gallbladder. Pancreas: No focal lesions or main ductal dilation. Spleen: Normal in size without focal abnormality. Adrenals/Urinary Tract: No adrenal nodules. No suspicious renal mass, calculi or hydronephrosis. No focal bladder wall thickening. Stomach/Bowel: Normal appearance of the stomach. No evidence of bowel wall thickening, distention, or inflammatory changes. Normal appendix. Vascular/Lymphatic: Aortic atherosclerosis. No enlarged abdominal or pelvic lymph nodes. Reproductive: Prostate is unremarkable. Other: No free fluid, fluid collection, or free air. Musculoskeletal: No acute or abnormal lytic or blastic osseous lesions. Median sternotomy wires are nondisplaced. IMPRESSION: 1. No acute abdominopelvic findings.  No hydronephrosis or calculi. 2.  Aortic Atherosclerosis (ICD10-I70.0). Electronically Signed   By: Limin  Xu M.D.   On: 07/08/2024 13:25    PROCEDURES and INTERVENTIONS:  Procedures  Medications  lidocaine   (LIDODERM ) 5 % 2 patch (2 patches Transdermal Patch Applied 07/08/24 1336)  potassium chloride  SA (KLOR-CON  M) CR tablet 40 mEq (40 mEq Oral Given 07/08/24 1226)  acetaminophen  (TYLENOL ) tablet 1,000 mg (1,000 mg Oral Given 07/08/24 1226)  methocarbamol (ROBAXIN) tablet 500 mg (500 mg Oral Given 07/08/24 1226)     IMPRESSION / MDM / ASSESSMENT AND PLAN / ED COURSE  I reviewed the triage vital signs and the nursing notes.  Differential diagnosis includes, but is not limited to, pneumothorax, rib fracture, shingles, gastritis, pneumonia  {Patient presents with symptoms of an acute illness or injury that is potentially life-threatening.  Patient presents with a few weeks of atraumatic left-sided chest discomfort with a benign workup, possibly of MSK etiology, suitable for outpatient management.  Localized tenderness and otherwise normal exam.  Improving symptoms with administration of nonnarcotic multimodal analgesia.  Mild hypokalemia is  replaced orally.  Troponins mildly elevated and flat near his baseline.  I considered admission for this patient but ultimately we decided on trial of outpatient management.  Discussed return precautions.  Clinical Course as of 07/08/24 1553  Thu Jul 08, 2024  1415 reassessed, reexamined and discussed reassuring workup and likely etiologies of his pain.  He is comfortable going home with medications aimed at likely MSK pain.  I think this is most reasonable. [DS]    Clinical Course User Index [DS] Claudene Rover, MD     FINAL CLINICAL IMPRESSION(S) / ED DIAGNOSES   Final diagnoses:  Rib pain on left side     Rx / DC Orders   ED Discharge Orders          Ordered    methocarbamol (ROBAXIN) 500 MG tablet  Every 8 hours PRN        07/08/24 1417    lidocaine  (LIDODERM ) 5 %  Every 12 hours        07/08/24 1417             Note:  This document was prepared using Dragon voice recognition software and may include unintentional dictation errors.    Claudene Rover, MD 07/08/24 [phone]

## 2024-07-08 NOTE — ED Triage Notes (Signed)
 C/O left flank pain x several weeks. Denies injury.

## 2024-07-08 NOTE — Discharge Instructions (Signed)
 Use Tylenol for pain and fevers.  Up to 1000 mg per dose, up to 4 times per day.  Do not take more than 4000 mg of Tylenol/acetaminophen within 24 hours..  Please use lidocaine patches at your site of pain.  Apply 1 patch at a time, leave on for 12 hours, then remove for 12 hours.  12 hours on, 12 hours off.  Do not apply more than 1 patch at a time.  Use Robaxin muscle relaxer as needed for more severe/breakthrough pain, up to 3 times per day. This medication can make some people sleepy, so do not use while driving, working or Designer, television/film set
